# Patient Record
Sex: Female | Born: 1960 | Race: White | Hispanic: No | Marital: Single | Smoking: Never smoker
Health system: Southern US, Community
[De-identification: ages and names within clinical notes are randomized; demographics above are authoritative.]

## PROBLEM LIST (undated history)

## (undated) DIAGNOSIS — M6282 Rhabdomyolysis: Secondary | ICD-10-CM

## (undated) DIAGNOSIS — F329 Major depressive disorder, single episode, unspecified: Secondary | ICD-10-CM

## (undated) DIAGNOSIS — E119 Type 2 diabetes mellitus without complications: Secondary | ICD-10-CM

## (undated) DIAGNOSIS — F319 Bipolar disorder, unspecified: Secondary | ICD-10-CM

## (undated) DIAGNOSIS — E039 Hypothyroidism, unspecified: Secondary | ICD-10-CM

## (undated) DIAGNOSIS — E876 Hypokalemia: Secondary | ICD-10-CM

## (undated) DIAGNOSIS — D696 Thrombocytopenia, unspecified: Secondary | ICD-10-CM

---

## 2019-05-19 ENCOUNTER — Emergency Department: Payer: Commercial Managed Care - PPO

## 2019-05-19 ENCOUNTER — Other Ambulatory Visit: Payer: Self-pay

## 2019-05-19 ENCOUNTER — Inpatient Hospital Stay
Admission: EM | Admit: 2019-05-19 | Discharge: 2019-05-28 | DRG: 085 | Disposition: A | Discharge status: Skilled Nursing Facility | Payer: Commercial Managed Care - PPO | Source: Skilled Nursing Facility | Attending: Internal Medicine | Admitting: Internal Medicine

## 2019-05-19 DIAGNOSIS — G9340 Encephalopathy, unspecified: Secondary | ICD-10-CM | POA: Diagnosis present

## 2019-05-19 DIAGNOSIS — N390 Urinary tract infection, site not specified: Secondary | ICD-10-CM | POA: Diagnosis not present

## 2019-05-19 DIAGNOSIS — S72001K Fracture of unspecified part of neck of right femur, subsequent encounter for closed fracture with nonunion: Secondary | ICD-10-CM

## 2019-05-19 DIAGNOSIS — I10 Essential (primary) hypertension: Secondary | ICD-10-CM | POA: Diagnosis present

## 2019-05-19 DIAGNOSIS — K6812 Psoas muscle abscess: Secondary | ICD-10-CM | POA: Diagnosis present

## 2019-05-19 DIAGNOSIS — L89152 Pressure ulcer of sacral region, stage 2: Secondary | ICD-10-CM | POA: Diagnosis present

## 2019-05-19 DIAGNOSIS — Z23 Encounter for immunization: Secondary | ICD-10-CM | POA: Diagnosis not present

## 2019-05-19 DIAGNOSIS — E440 Moderate protein-calorie malnutrition: Secondary | ICD-10-CM | POA: Diagnosis present

## 2019-05-19 DIAGNOSIS — R4189 Other symptoms and signs involving cognitive functions and awareness: Secondary | ICD-10-CM | POA: Diagnosis not present

## 2019-05-19 DIAGNOSIS — K429 Umbilical hernia without obstruction or gangrene: Secondary | ICD-10-CM | POA: Diagnosis present

## 2019-05-19 DIAGNOSIS — S42253D Displaced fracture of greater tuberosity of unspecified humerus, subsequent encounter for fracture with routine healing: Secondary | ICD-10-CM | POA: Diagnosis not present

## 2019-05-19 DIAGNOSIS — L89150 Pressure ulcer of sacral region, unstageable: Secondary | ICD-10-CM | POA: Diagnosis present

## 2019-05-19 DIAGNOSIS — R339 Retention of urine, unspecified: Secondary | ICD-10-CM | POA: Diagnosis not present

## 2019-05-19 DIAGNOSIS — R410 Disorientation, unspecified: Secondary | ICD-10-CM | POA: Diagnosis not present

## 2019-05-19 DIAGNOSIS — D638 Anemia in other chronic diseases classified elsewhere: Secondary | ICD-10-CM | POA: Diagnosis present

## 2019-05-19 DIAGNOSIS — I63012 Cerebral infarction due to thrombosis of left vertebral artery: Secondary | ICD-10-CM | POA: Diagnosis not present

## 2019-05-19 DIAGNOSIS — M25569 Pain in unspecified knee: Secondary | ICD-10-CM

## 2019-05-19 DIAGNOSIS — E876 Hypokalemia: Secondary | ICD-10-CM | POA: Diagnosis not present

## 2019-05-19 DIAGNOSIS — F319 Bipolar disorder, unspecified: Secondary | ICD-10-CM | POA: Diagnosis present

## 2019-05-19 DIAGNOSIS — Z20822 Contact with and (suspected) exposure to covid-19: Secondary | ICD-10-CM | POA: Diagnosis present

## 2019-05-19 DIAGNOSIS — I959 Hypotension, unspecified: Secondary | ICD-10-CM | POA: Diagnosis present

## 2019-05-19 DIAGNOSIS — M6282 Rhabdomyolysis: Secondary | ICD-10-CM | POA: Diagnosis present

## 2019-05-19 DIAGNOSIS — M4646 Discitis, unspecified, lumbar region: Secondary | ICD-10-CM | POA: Diagnosis present

## 2019-05-19 DIAGNOSIS — Z8673 Personal history of transient ischemic attack (TIA), and cerebral infarction without residual deficits: Secondary | ICD-10-CM | POA: Diagnosis not present

## 2019-05-19 DIAGNOSIS — R059 Cough, unspecified: Secondary | ICD-10-CM

## 2019-05-19 DIAGNOSIS — R4182 Altered mental status, unspecified: Secondary | ICD-10-CM

## 2019-05-19 DIAGNOSIS — E039 Hypothyroidism, unspecified: Secondary | ICD-10-CM | POA: Diagnosis present

## 2019-05-19 DIAGNOSIS — B9620 Unspecified Escherichia coli [E. coli] as the cause of diseases classified elsewhere: Secondary | ICD-10-CM | POA: Diagnosis present

## 2019-05-19 DIAGNOSIS — M069 Rheumatoid arthritis, unspecified: Secondary | ICD-10-CM | POA: Diagnosis present

## 2019-05-19 DIAGNOSIS — Y92129 Unspecified place in nursing home as the place of occurrence of the external cause: Secondary | ICD-10-CM

## 2019-05-19 DIAGNOSIS — L0291 Cutaneous abscess, unspecified: Secondary | ICD-10-CM

## 2019-05-19 DIAGNOSIS — R05 Cough: Secondary | ICD-10-CM

## 2019-05-19 DIAGNOSIS — E871 Hypo-osmolality and hyponatremia: Secondary | ICD-10-CM | POA: Diagnosis not present

## 2019-05-19 DIAGNOSIS — I639 Cerebral infarction, unspecified: Secondary | ICD-10-CM | POA: Diagnosis not present

## 2019-05-19 DIAGNOSIS — E1100 Type 2 diabetes mellitus with hyperosmolarity without nonketotic hyperglycemic-hyperosmolar coma (NKHHC): Secondary | ICD-10-CM | POA: Diagnosis present

## 2019-05-19 DIAGNOSIS — M4626 Osteomyelitis of vertebra, lumbar region: Secondary | ICD-10-CM | POA: Diagnosis present

## 2019-05-19 DIAGNOSIS — E1142 Type 2 diabetes mellitus with diabetic polyneuropathy: Secondary | ICD-10-CM | POA: Diagnosis present

## 2019-05-19 DIAGNOSIS — Z79890 Hormone replacement therapy (postmenopausal): Secondary | ICD-10-CM

## 2019-05-19 DIAGNOSIS — L89322 Pressure ulcer of left buttock, stage 2: Secondary | ICD-10-CM | POA: Diagnosis present

## 2019-05-19 DIAGNOSIS — M810 Age-related osteoporosis without current pathological fracture: Secondary | ICD-10-CM | POA: Diagnosis present

## 2019-05-19 DIAGNOSIS — R5381 Other malaise: Secondary | ICD-10-CM | POA: Diagnosis present

## 2019-05-19 DIAGNOSIS — F05 Delirium due to known physiological condition: Secondary | ICD-10-CM | POA: Diagnosis not present

## 2019-05-19 DIAGNOSIS — S06330A Contusion and laceration of cerebrum, unspecified, without loss of consciousness, initial encounter: Principal | ICD-10-CM | POA: Diagnosis present

## 2019-05-19 DIAGNOSIS — R404 Transient alteration of awareness: Secondary | ICD-10-CM | POA: Diagnosis not present

## 2019-05-19 DIAGNOSIS — S0633AA Contusion and laceration of cerebrum, unspecified, with loss of consciousness status unknown, initial encounter: Secondary | ICD-10-CM | POA: Diagnosis present

## 2019-05-19 DIAGNOSIS — W01198A Fall on same level from slipping, tripping and stumbling with subsequent striking against other object, initial encounter: Secondary | ICD-10-CM | POA: Diagnosis present

## 2019-05-19 DIAGNOSIS — L8990 Pressure ulcer of unspecified site, unspecified stage: Secondary | ICD-10-CM | POA: Diagnosis present

## 2019-05-19 DIAGNOSIS — S06339A Contusion and laceration of cerebrum, unspecified, with loss of consciousness of unspecified duration, initial encounter: Secondary | ICD-10-CM | POA: Diagnosis present

## 2019-05-19 DIAGNOSIS — Z79899 Other long term (current) drug therapy: Secondary | ICD-10-CM

## 2019-05-19 DIAGNOSIS — E1165 Type 2 diabetes mellitus with hyperglycemia: Secondary | ICD-10-CM | POA: Diagnosis not present

## 2019-05-19 DIAGNOSIS — R262 Difficulty in walking, not elsewhere classified: Secondary | ICD-10-CM

## 2019-05-19 DIAGNOSIS — R296 Repeated falls: Secondary | ICD-10-CM | POA: Diagnosis not present

## 2019-05-19 DIAGNOSIS — M1710 Unilateral primary osteoarthritis, unspecified knee: Secondary | ICD-10-CM | POA: Diagnosis present

## 2019-05-19 DIAGNOSIS — M899 Disorder of bone, unspecified: Secondary | ICD-10-CM | POA: Diagnosis not present

## 2019-05-19 DIAGNOSIS — F3113 Bipolar disorder, current episode manic without psychotic features, severe: Secondary | ICD-10-CM | POA: Diagnosis not present

## 2019-05-19 DIAGNOSIS — S06310D Contusion and laceration of right cerebrum without loss of consciousness, subsequent encounter: Secondary | ICD-10-CM | POA: Diagnosis not present

## 2019-05-19 DIAGNOSIS — E118 Type 2 diabetes mellitus with unspecified complications: Secondary | ICD-10-CM

## 2019-05-19 HISTORY — DX: Bipolar disorder, unspecified: F31.9

## 2019-05-19 HISTORY — DX: Type 2 diabetes mellitus without complications: E11.9

## 2019-05-19 HISTORY — DX: Thrombocytopenia, unspecified: D69.6

## 2019-05-19 HISTORY — DX: Hypothyroidism, unspecified: E03.9

## 2019-05-19 HISTORY — DX: Major depressive disorder, single episode, unspecified: F32.9

## 2019-05-19 HISTORY — DX: Rhabdomyolysis: M62.82

## 2019-05-19 HISTORY — DX: Hypokalemia: E87.6

## 2019-05-19 LAB — BASIC METABOLIC PANEL
Anion gap: 13 (ref 5–15)
BUN: 29 mg/dL — ABNORMAL HIGH (ref 6–20)
CO2: 29 mmol/L (ref 22–32)
Calcium: 8.5 mg/dL — ABNORMAL LOW (ref 8.9–10.3)
Chloride: 93 mmol/L — ABNORMAL LOW (ref 98–111)
Creatinine, Ser: 0.4 mg/dL — ABNORMAL LOW (ref 0.44–1.00)
GFR calc Af Amer: >60 mL/min (ref >60)
GFR calc non Af Amer: >60 mL/min (ref >60)
Glucose, Bld: 184 mg/dL — ABNORMAL HIGH (ref 70–99)
Potassium: 2.9 mmol/L — ABNORMAL LOW (ref 3.5–5.1)
Sodium: 135 mmol/L (ref 135–145)

## 2019-05-19 LAB — COMPREHENSIVE METABOLIC PANEL
ALT: 10 U/L (ref 0–44)
AST: 15 U/L (ref 15–41)
Albumin: 3 g/dL — ABNORMAL LOW (ref 3.5–5.0)
Alkaline Phosphatase: 161 U/L — ABNORMAL HIGH (ref 38–126)
Anion gap: 14 (ref 5–15)
BUN: 36 mg/dL — ABNORMAL HIGH (ref 6–20)
CO2: 29 mmol/L (ref 22–32)
Calcium: 8.8 mg/dL — ABNORMAL LOW (ref 8.9–10.3)
Chloride: 91 mmol/L — ABNORMAL LOW (ref 98–111)
Creatinine, Ser: 0.45 mg/dL (ref 0.44–1.00)
GFR calc Af Amer: >60 mL/min (ref >60)
GFR calc non Af Amer: >60 mL/min (ref >60)
Glucose, Bld: 242 mg/dL — ABNORMAL HIGH (ref 70–99)
Potassium: 2.6 mmol/L — CL (ref 3.5–5.1)
Sodium: 134 mmol/L — ABNORMAL LOW (ref 135–145)
Total Bilirubin: 1.1 mg/dL (ref 0.3–1.2)
Total Protein: 8.2 g/dL — ABNORMAL HIGH (ref 6.5–8.1)

## 2019-05-19 LAB — PROTIME-INR
INR: 1.1 (ref 0.8–1.2)
Prothrombin Time: 14.2 seconds (ref 11.4–15.2)

## 2019-05-19 LAB — TYPE AND SCREEN
ABO/RH(D): O POS
Antibody Screen: NEGATIVE

## 2019-05-19 LAB — MAGNESIUM: Magnesium: 1.7 mg/dL (ref 1.7–2.4)

## 2019-05-19 LAB — TROPONIN I (HIGH SENSITIVITY)
Troponin I (High Sensitivity): 5 ng/L (ref <18)
Troponin I (High Sensitivity): 5 ng/L (ref <18)

## 2019-05-19 LAB — APTT: aPTT: 28 seconds (ref 24–36)

## 2019-05-19 LAB — CBC WITH DIFFERENTIAL/PLATELET
Abs Immature Granulocytes: 0.06 10*3/uL (ref 0.00–0.07)
Basophils Absolute: 0 10*3/uL (ref 0.0–0.1)
Basophils Relative: 0 %
Eosinophils Absolute: 0 10*3/uL (ref 0.0–0.5)
Eosinophils Relative: 0 %
HCT: 32.3 % — ABNORMAL LOW (ref 36.0–46.0)
Hemoglobin: 11 g/dL — ABNORMAL LOW (ref 12.0–15.0)
Immature Granulocytes: 1 %
Lymphocytes Relative: 6 %
Lymphs Abs: 0.7 10*3/uL (ref 0.7–4.0)
MCH: 30.2 pg (ref 26.0–34.0)
MCHC: 34.1 g/dL (ref 30.0–36.0)
MCV: 88.7 fL (ref 80.0–100.0)
Monocytes Absolute: 0.4 10*3/uL (ref 0.1–1.0)
Monocytes Relative: 3 %
Neutro Abs: 11.5 10*3/uL — ABNORMAL HIGH (ref 1.7–7.7)
Neutrophils Relative %: 90 %
Platelets: 349 10*3/uL (ref 150–400)
RBC: 3.64 MIL/uL — ABNORMAL LOW (ref 3.87–5.11)
RDW: 14.4 % (ref 11.5–15.5)
WBC: 12.7 10*3/uL — ABNORMAL HIGH (ref 4.0–10.5)
nRBC: 0 % (ref 0.0–0.2)

## 2019-05-19 LAB — SARS CORONAVIRUS 2 BY RT PCR (HOSPITAL ORDER, PERFORMED IN ~~LOC~~ HOSPITAL LAB): SARS Coronavirus 2: NEGATIVE

## 2019-05-19 IMAGING — MR MR THORACIC SPINE W/O CM
17 of 20 series · 29 of 48 positions shown · non-contrast
Comparison: None.

CLINICAL DATA: Mental status changes.  Fall.

EXAM:
MRI THORACIC SPINE WITHOUT CONTRAST
TECHNIQUE: Multiplanar, multisequence MR imaging of the thoracic spine was
performed. No intravenous contrast was administered.

[Series 39: FLAIR · sagittal · 3.0mm · 0.78mm/px · 1 of 15 slices shown]
[im 1/15]
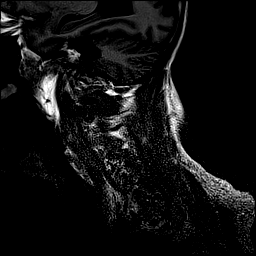

[Series 40: STIR · sagittal · 3.0mm · 0.62mm/px · 1 of 15 slices shown (1 of 3)]
[im 1/15]
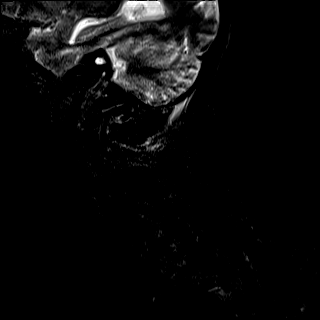

[Series 41: STIR · sagittal · 3.0mm · 0.62mm/px · 1 of 15 slices shown (2 of 3)]
[im 1/15]
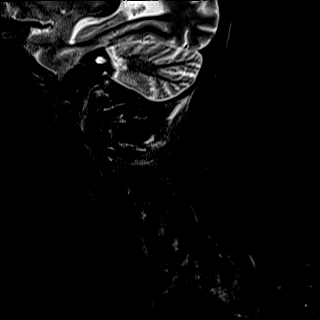

[Series 42: T2 · axial · 3.0mm · 0.70mm/px · 1 of 24 slices shown (1 of 3)]
[im 1/24]
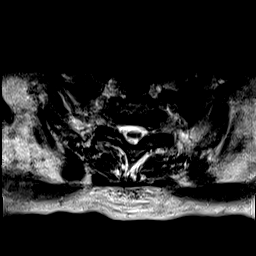

[Series 43: ax mpgr · axial · 3.0mm · 0.35mm/px · 1 of 27 slices shown]
[im 1/27]
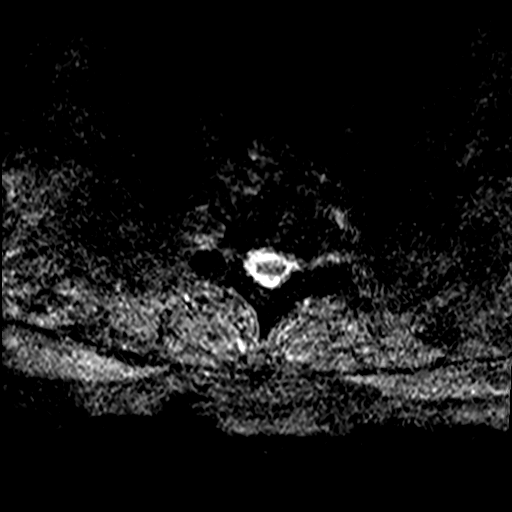

[Series 44: T1 · axial · non-contrast · 3.0mm · 0.35mm/px · z∈[-118,-38]mm · 2 of 27 slices shown (1 of 3)]
[im 1/27]
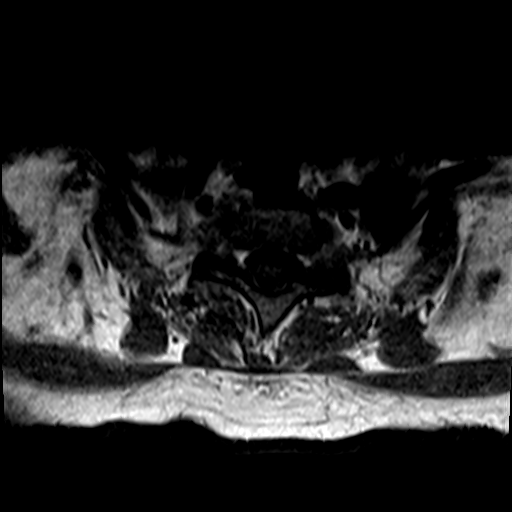
[im 27/27]
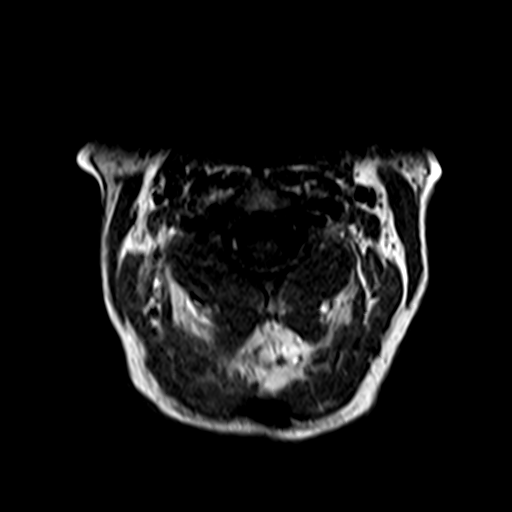

[Series 45: T2 · sagittal · 3.0mm · 0.94mm/px · 1 of 17 slices shown (2 of 3)]
[im 1/17]
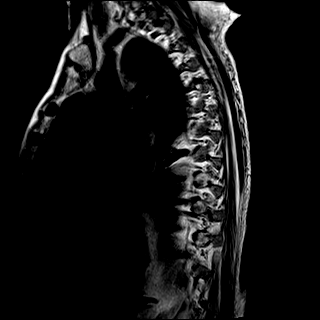

[Series 46: T1 · sagittal · 3.0mm · 0.94mm/px · 1 of 17 slices shown (2 of 3)]
[im 1/17]
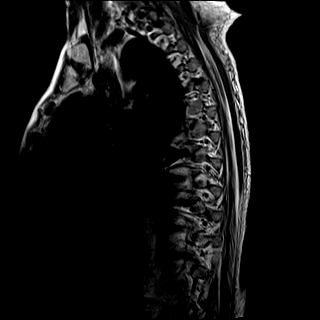

[Series 47: STIR · sagittal · 3.0mm · 0.47mm/px · 1 of 17 slices shown (3 of 3)]
[im 1/17]
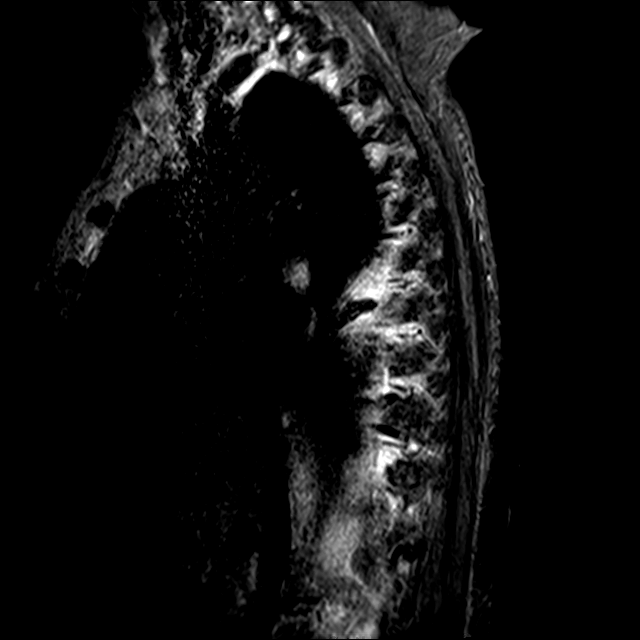

[Series 48: T2 · axial · 4.0mm · 0.59mm/px · z∈[-315,-123]mm · 2 of 39 slices shown (3 of 3)]
[im 1/39]
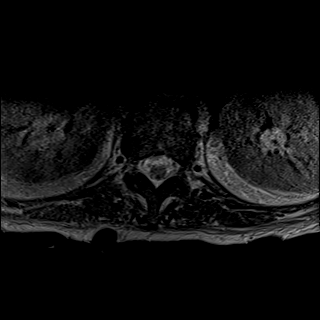
[im 39/39]
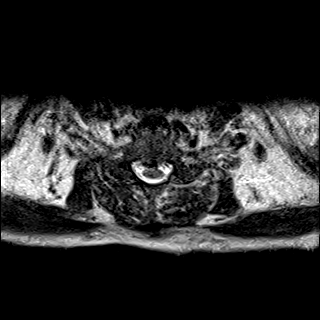

[Series 49: GRE · axial · 4.0mm · 0.37mm/px · z∈[-315,-123]mm · 2 of 39 slices shown]
[im 1/39]
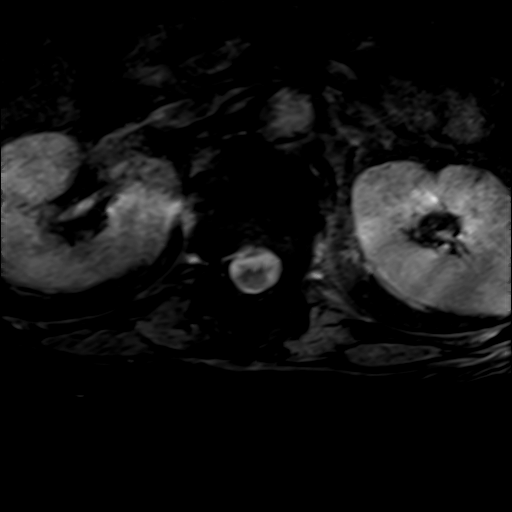
[im 39/39]
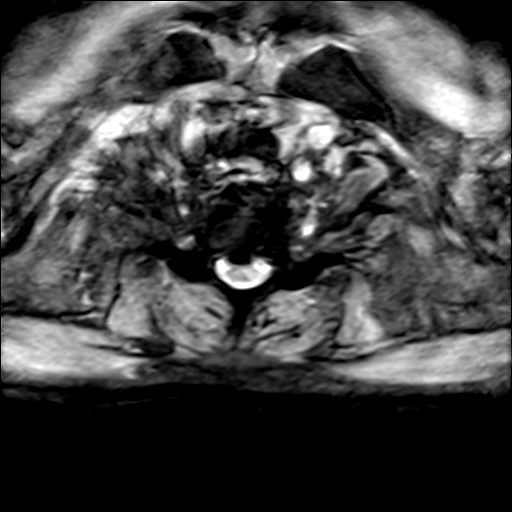

[Series 50: T1 post-contrast · axial · non-contrast · 4.0mm · 0.37mm/px · z∈[-315,-123]mm · 2 of 39 slices shown (1 of 2)]
[im 1/39]
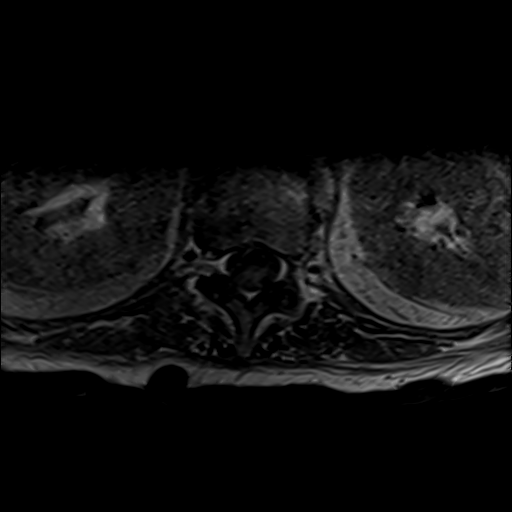
[im 39/39]
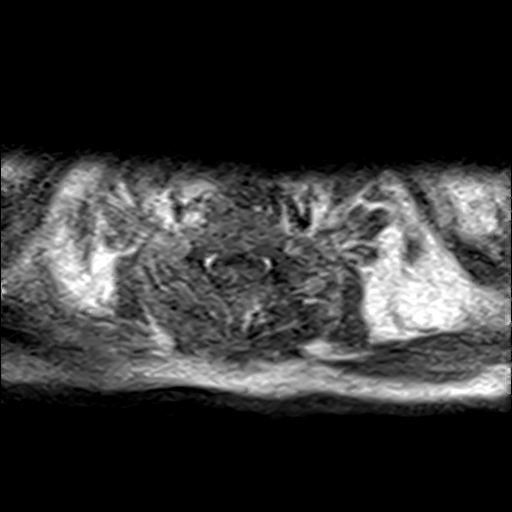

[Series 59: angio_fl3d_cor_pre_ttc=2.0s · coronal · 0.9mm · 0.85mm/px · 6 of 96 slices shown]
[im 1/96]
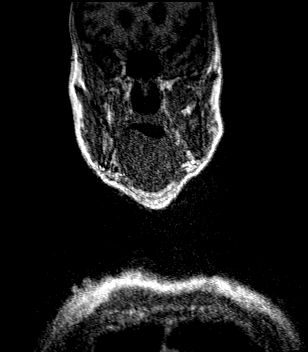
[im 20/96]
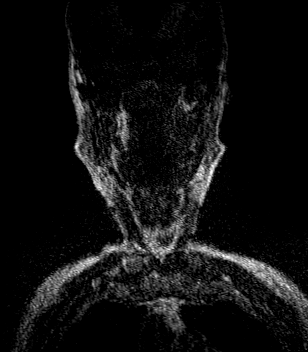
[im 39/96]
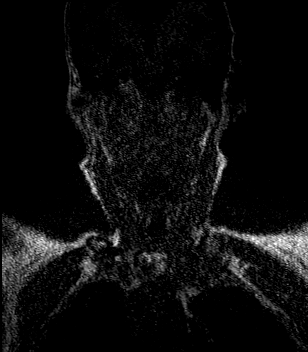
[im 58/96]
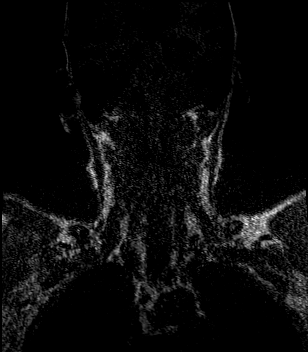
[im 77/96]
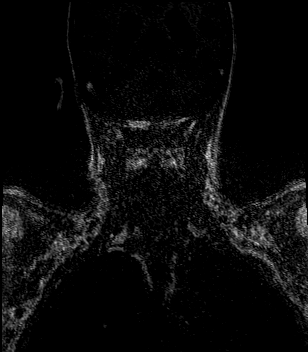
[im 96/96]
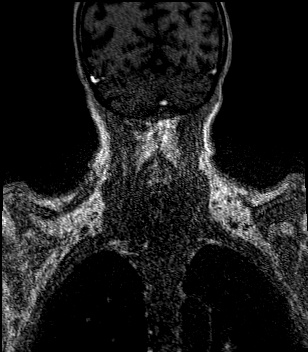

[Series 61: angio_fl3d_cor_post_ttc=2.0s · coronal · 0.9mm · 0.85mm/px · 2 of 96 slices shown]
[im 1/96]
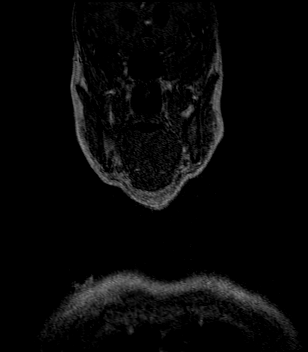
[im 20/96]
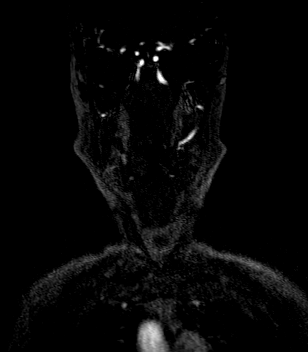

[Series 66: T1 post-contrast · axial · 3.0mm · 0.35mm/px · z∈[-118,-38]mm · 2 of 27 slices shown (2 of 2)]
[im 1/27]
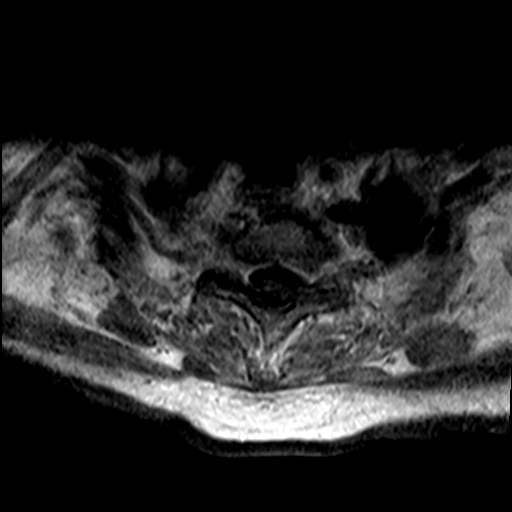
[im 27/27]
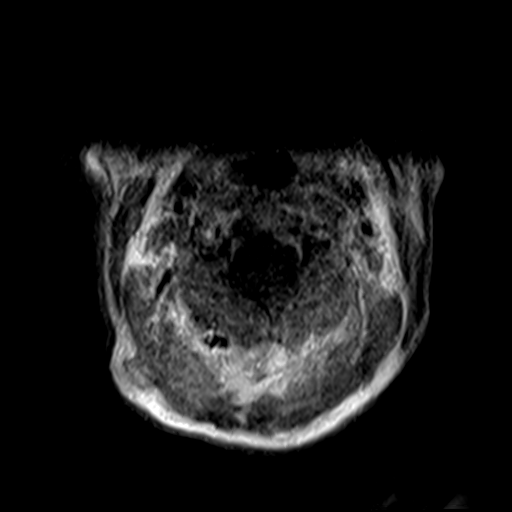

[Series 67: T1 fat-sat post-contrast · sagittal · 4.0mm · 0.81mm/px · 1 of 19 slices shown]
[im 1/19]
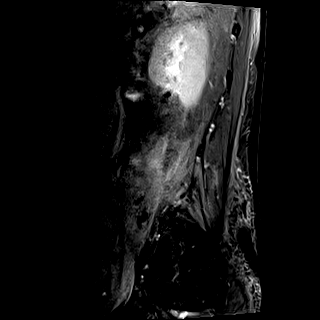

[Series 68: T1 · axial · 4.0mm · 0.39mm/px · z∈[-479,-299]mm · 2 of 33 slices shown (3 of 3)]
[im 1/33]
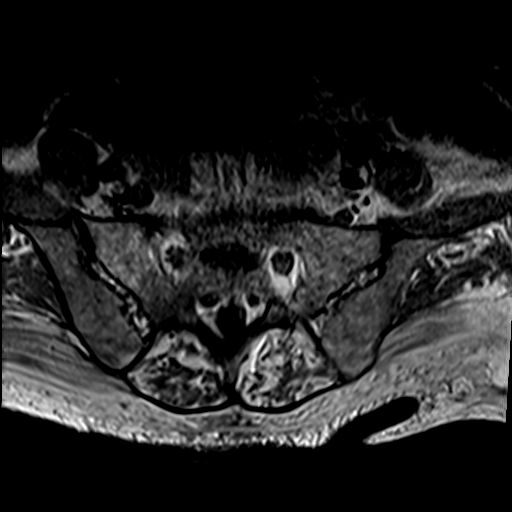
[im 33/33]
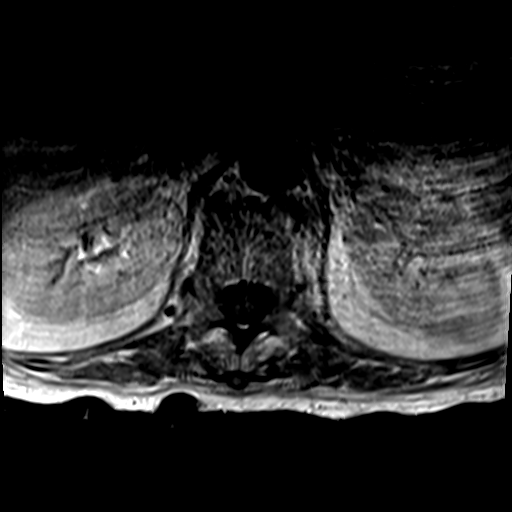

[29 of 48 positions shown; findings below may reference images not displayed]

FINDINGS: Alignment:  No traumatic malalignment.

Vertebrae: No thoracic region fracture.

Cord:  No cord compression or primary cord lesion.

Paraspinal and other soft tissues: Negative

Disc levels:

Detail is limited because of motion. Degenerative spondylosis and
facet arthritis in the thoracic region as often seen at this age. No
evidence of compressive canal stenosis. Foraminal stenosis on the
right at T4-5 due to osteophytic encroachment could possibly affect
the right C5 nerve. The other foramina appear sufficiently patent.
IMPRESSION: Motion degraded study. No evidence of acute thoracic region fracture
or cord injury.

Degenerative spondylosis and facet arthropathy, with limited
evaluation due to motion. No apparent compressive canal stenosis.
Right foraminal narrowing at T4-5 because of osteophytic
encroachment.

## 2019-05-19 IMAGING — MR MR LUMBAR SPINE WO/W CM
4 of 5 series · 33 of 48 positions shown · IV contrast (gadavist)
Comparison: CT [DATE]

CLINICAL DATA: Fell.  Abnormal CT of the lumbar region.

EXAM:
MRI LUMBAR SPINE WITHOUT AND WITH CONTRAST
TECHNIQUE: Multiplanar and multiecho pulse sequences of the lumbar spine were
obtained without and with intravenous contrast.
CONTRAST:  5mL GADAVIST GADOBUTROL 1 MMOL/ML IV SOLN

[Series 32: T2 · sagittal · 4.0mm · 0.81mm/px · 8 of 19 slices shown (1 of 2)]
[im 1/19]
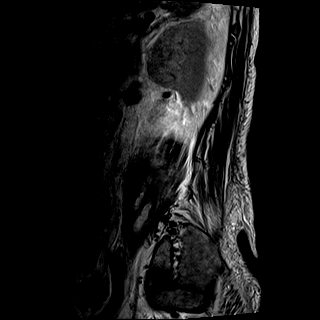
[im 3/19]
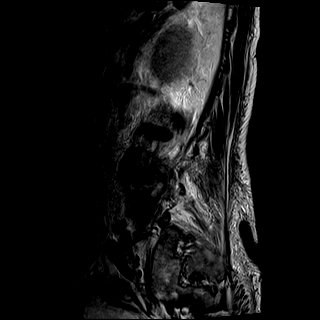
[im 6/19]
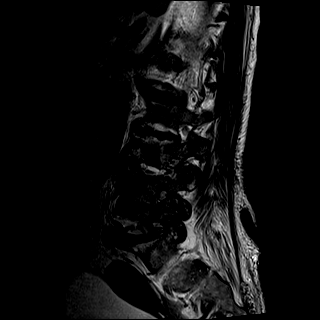
[im 8/19]
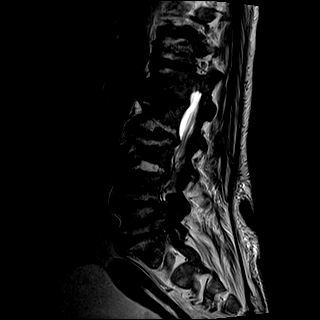
[im 11/19]
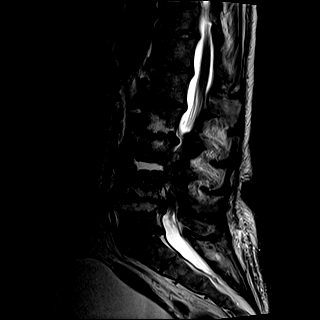
[im 13/19]
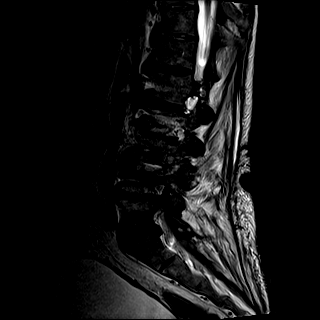
[im 16/19]
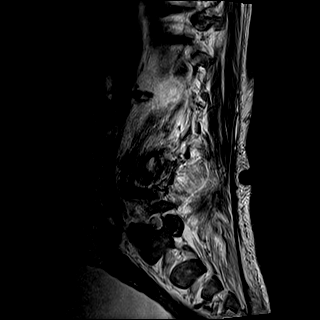
[im 19/19]
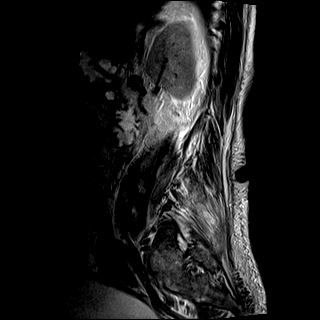

[Series 33: T1 · sagittal · 4.0mm · 0.81mm/px · 7 of 19 slices shown (1 of 2)]
[im 1/19]
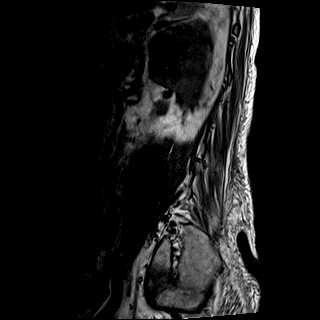
[im 4/19]
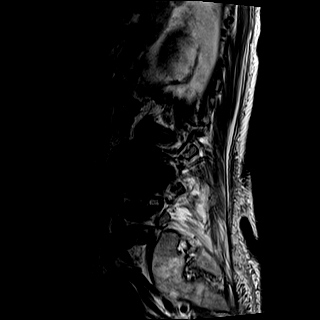
[im 7/19]
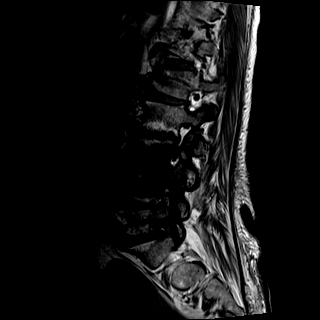
[im 10/19]
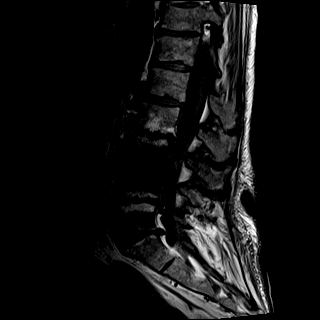
[im 13/19]
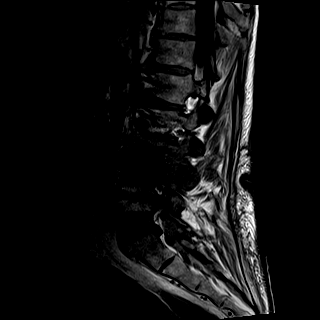
[im 16/19]
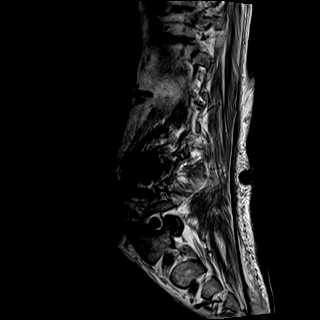
[im 19/19]
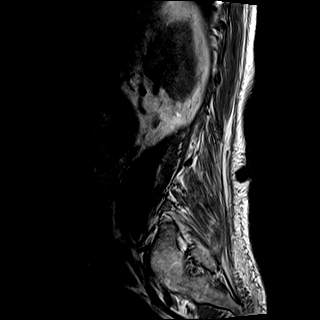

[Series 35: T2 · axial · 4.0mm · 0.78mm/px · z∈[-479,-299]mm · 9 of 33 slices shown (2 of 2)]
[im 1/33]
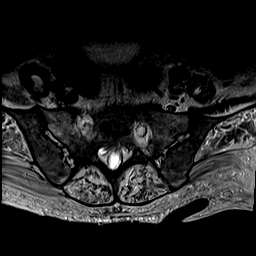
[im 6/33]
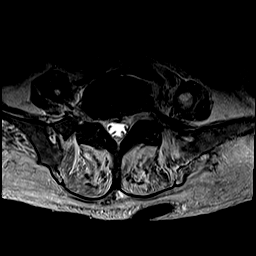
[im 11/33]
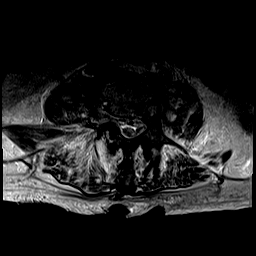
[im 14/33]
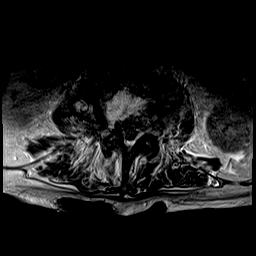
[im 17/33]
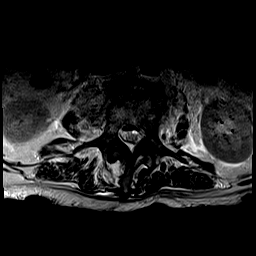
[im 19/33]
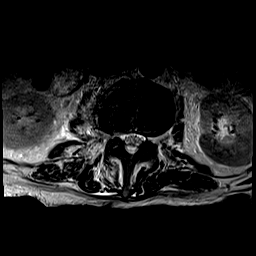
[im 22/33]
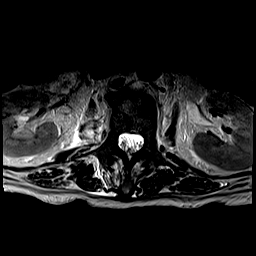
[im 27/33]
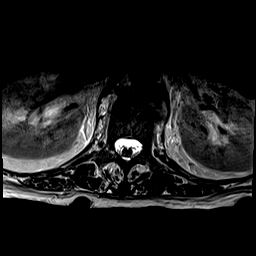
[im 33/33]
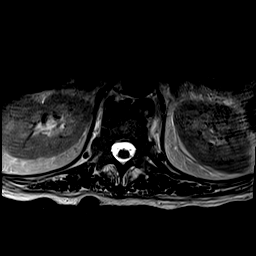

[Series 36: T1 · axial · 4.0mm · 0.39mm/px · z∈[-479,-299]mm · 9 of 33 slices shown (2 of 2)]
[im 1/33]
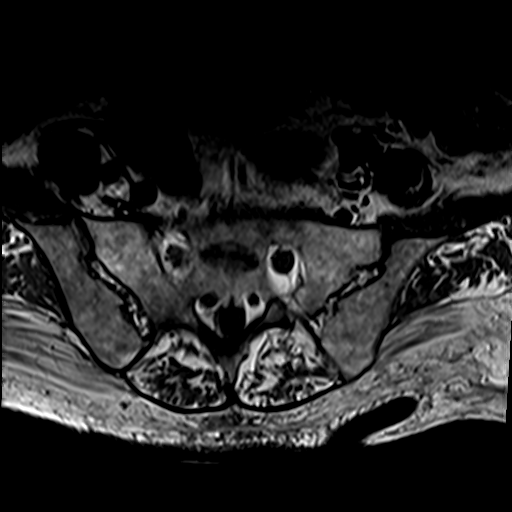
[im 6/33]
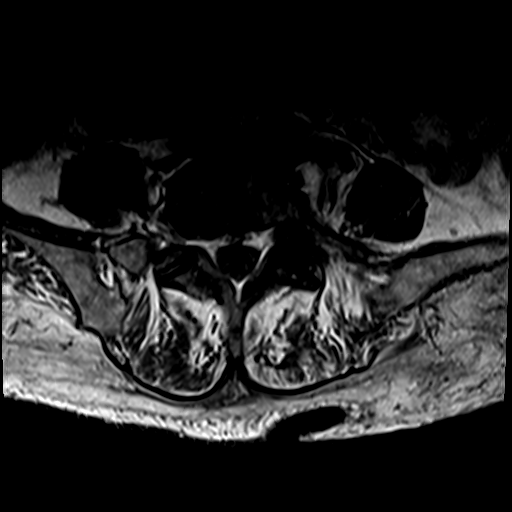
[im 11/33]
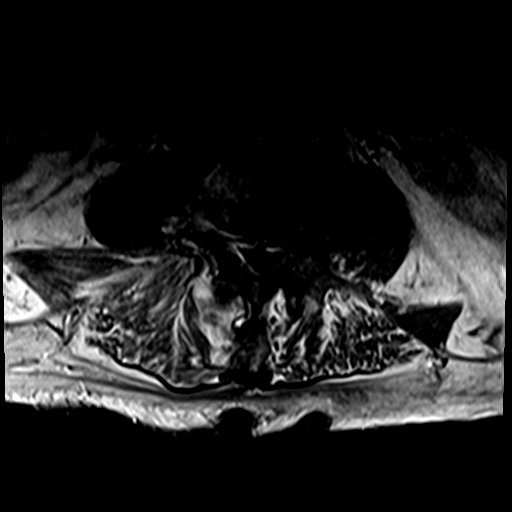
[im 14/33]
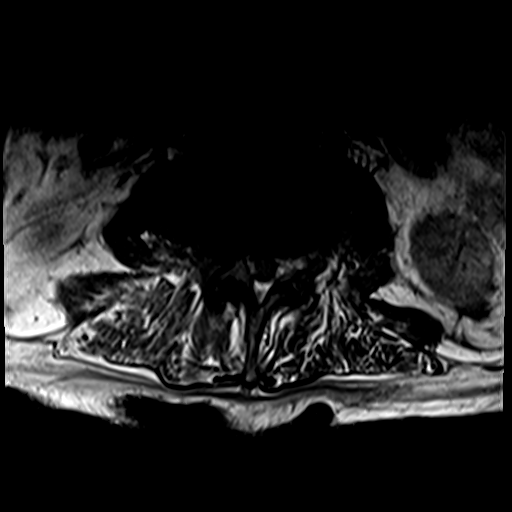
[im 17/33]
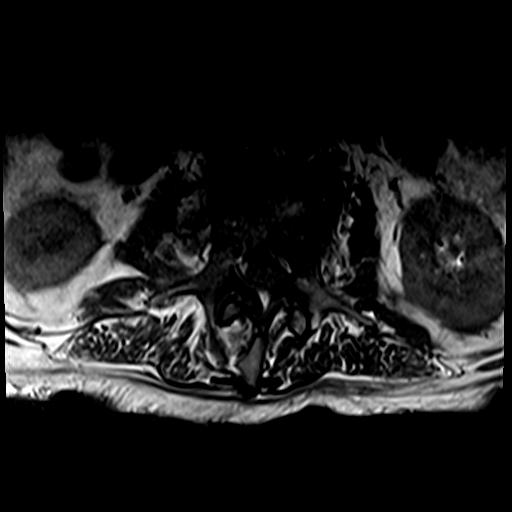
[im 19/33]
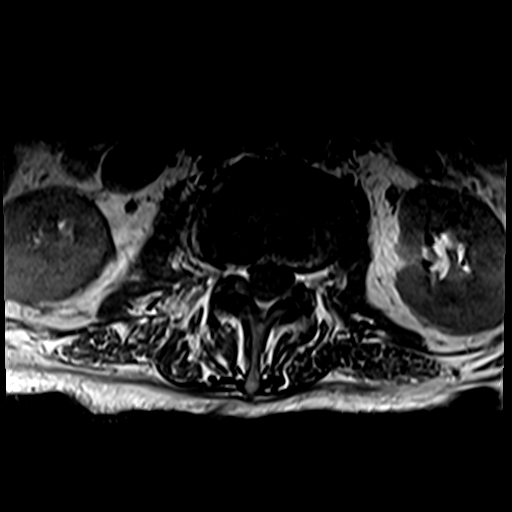
[im 22/33]
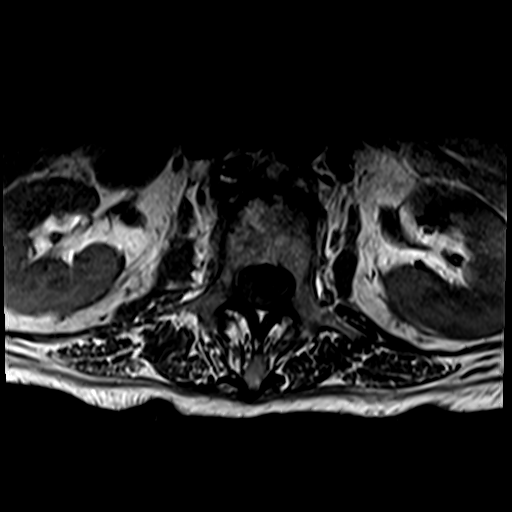
[im 27/33]
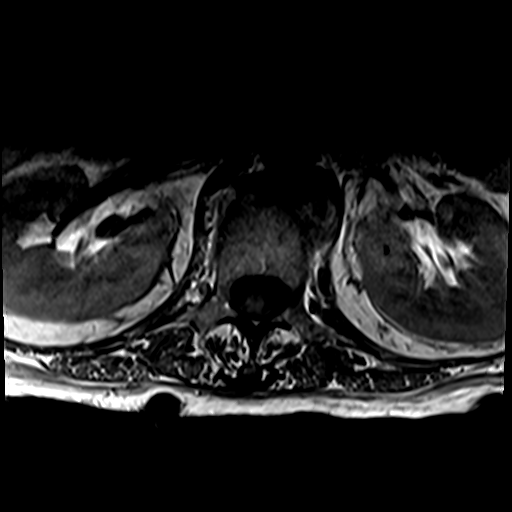
[im 33/33]
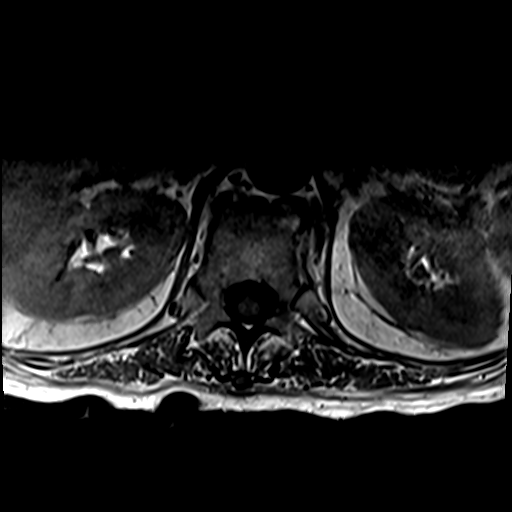

[33 of 48 positions shown; findings below may reference images not displayed]

FINDINGS: Segmentation:  5 lumbar type vertebral bodies.

Alignment:  Curvature convex to the left the apex at L2-3.

Vertebrae: Chronic discogenic endplate changes at L2-3, L4-5 and
L5-S1. At L3-4, the disc space is filled with fluid intensity
material. There is edema and enhancement of the L3 and L4 vertebral
bodies. There is regional epidural edema and enhancement. These
findings are worrisome for infectious discitis osteomyelitis. There
are fluid collections within the psoas muscles bilaterally, also
most consistent with infection.

Conus medullaris and cauda equina: Conus extends to the L1-2 level.
Conus and cauda equina appear normal.

Paraspinal and other soft tissues: Bilateral psoas muscle fluid
collections most consistent with abscesses. Psoas hematomas could
have a similar appearance in a different setting.

Disc levels:

L1-2: Disc bulge.  No compressive stenosis.

L2-3: Endplate osteophytes and bulging of the disc. Mild facet
hypertrophy. Moderate multifactorial stenosis.

L3-4: As noted above, fluid intensity material filling the disc
space. Edema of the L3 and L4 vertebral bodies. Epidural edema and
enhancement. Bilateral psoas collections most consistent with psoas
abscesses. The picture is most consistent with infectious discitis
osteomyelitis.

L4-5: Spondylosis with endplate osteophytes and chronic disc
protrusion. Moderate multifactorial stenosis that could cause neural
compression.

L5-S1: Endplate osteophytes and bulging of the disc. No central
canal stenosis. Moderate left foraminal stenosis.
IMPRESSION: Findings consistent with infectious discitis at the L3-4 level with
L3 and L4 osteomyelitis and bilateral psoas abscesses. See above for
full discussion.

## 2019-05-19 IMAGING — MR MR LUMBAR SPINE WO/W CM
2 series · 29 of 48 positions shown · IV contrast (5ml Gadavist)
Comparison: CT [DATE]

CLINICAL DATA: Fell.  Abnormal CT of the lumbar region.

EXAM:
MRI LUMBAR SPINE WITHOUT AND WITH CONTRAST
TECHNIQUE: Multiplanar and multiecho pulse sequences of the lumbar spine were
obtained without and with intravenous contrast.
CONTRAST:  5mL GADAVIST GADOBUTROL 1 MMOL/ML IV SOLN

[Series 67: T1 fat-sat post-contrast · sagittal · 4.0mm · 0.81mm/px · 18 of 19 slices shown]
[im 1/19]
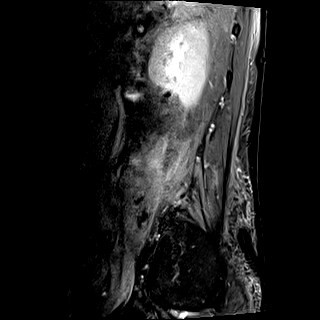
[im 2/19]
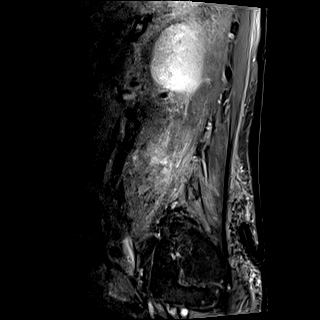
[im 3/19]
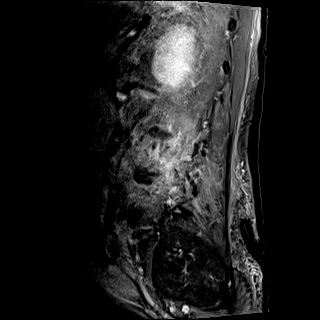
[im 4/19]
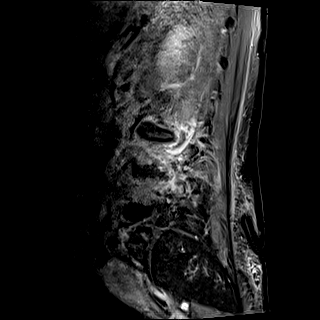
[im 5/19]
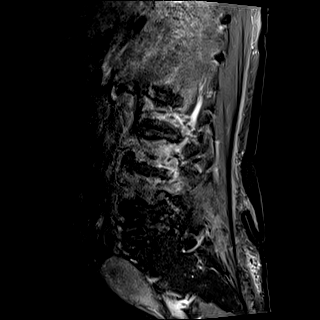
[im 6/19]
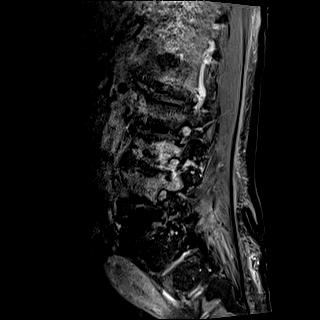
[im 7/19]
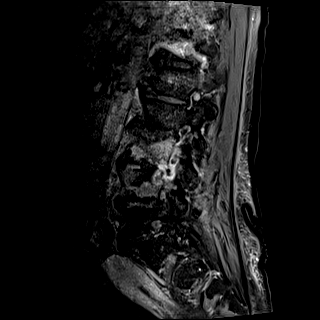
[im 8/19]
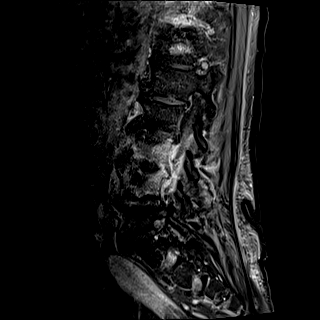
[im 9/19]
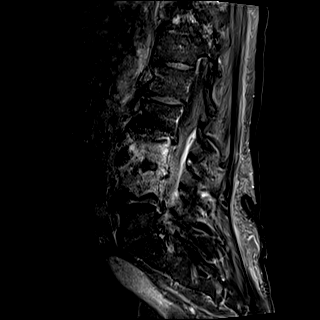
[im 10/19]
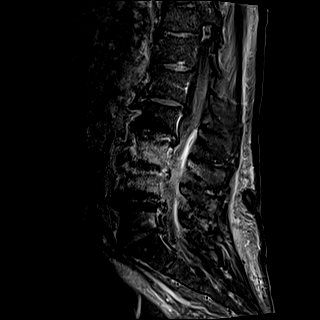
[im 11/19]
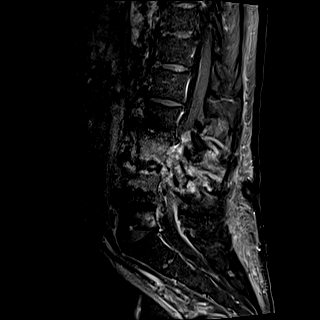
[im 12/19]
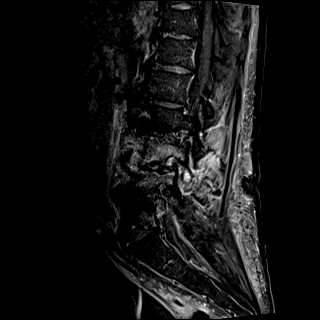
[im 13/19]
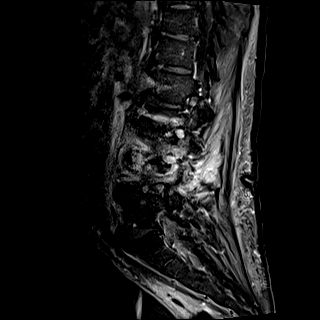
[im 14/19]
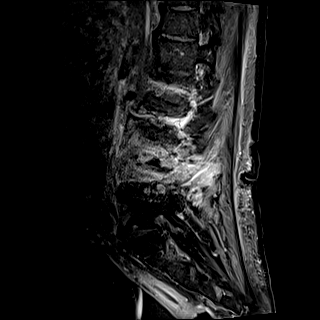
[im 15/19]
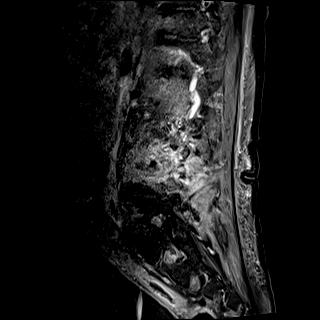
[im 16/19]
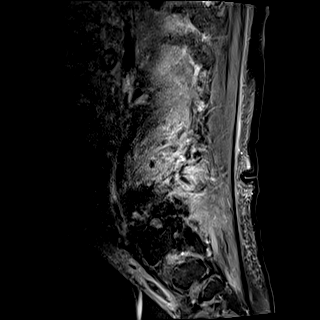
[im 17/19]
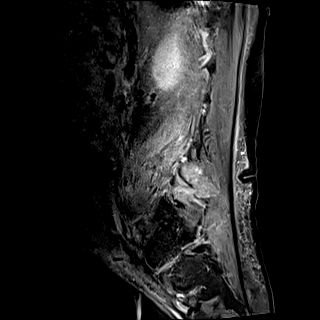
[im 19/19]
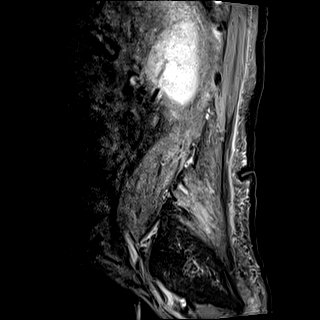

[Series 68: T1 · axial · 4.0mm · 0.39mm/px · z∈[-479,-309]mm · 11 of 33 slices shown]
[im 1/33]
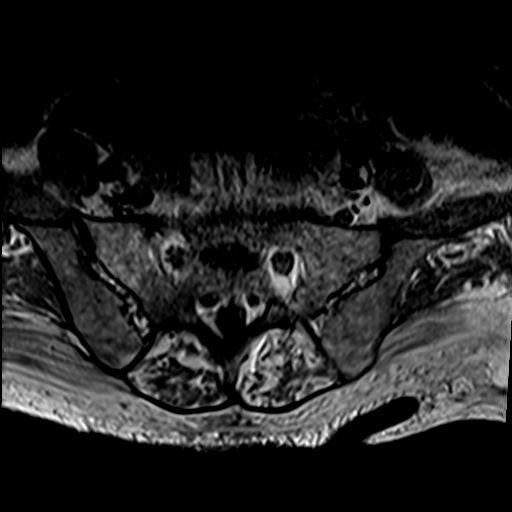
[im 2/33]
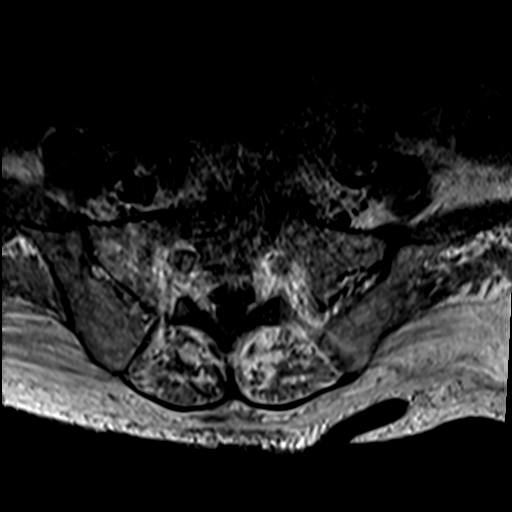
[im 6/33]
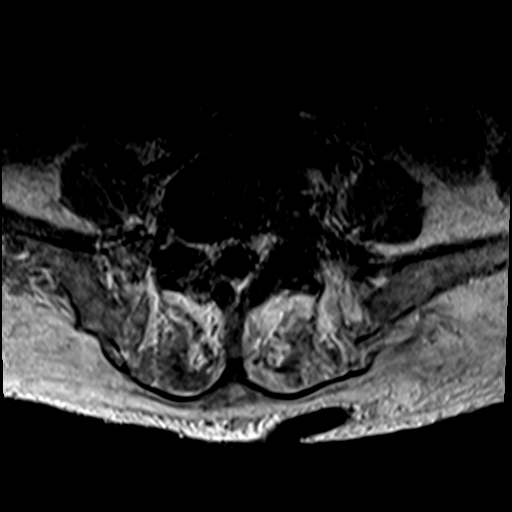
[im 10/33]
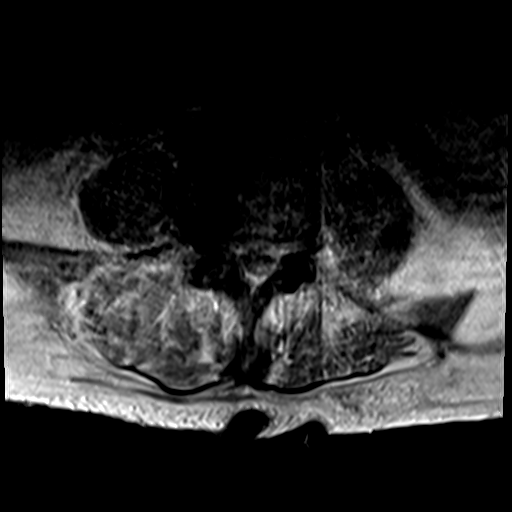
[im 15/33]
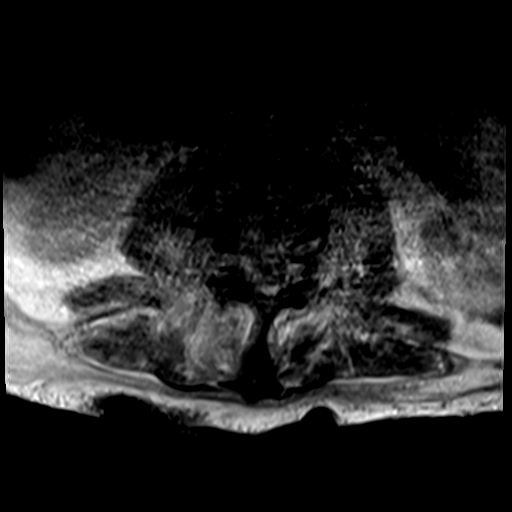
[im 17/33]
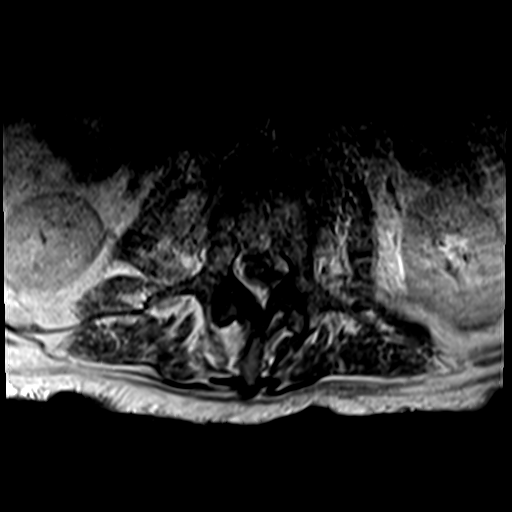
[im 18/33]
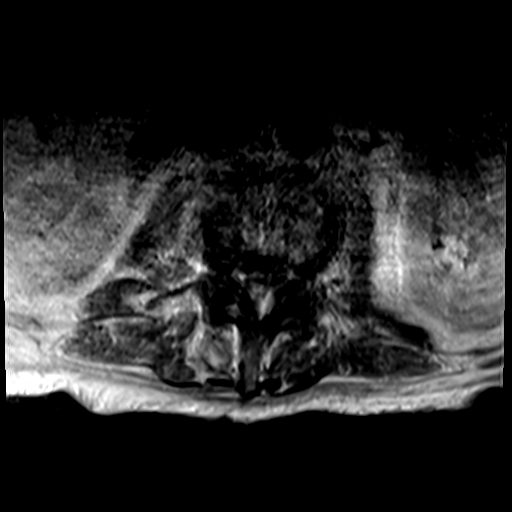
[im 23/33]
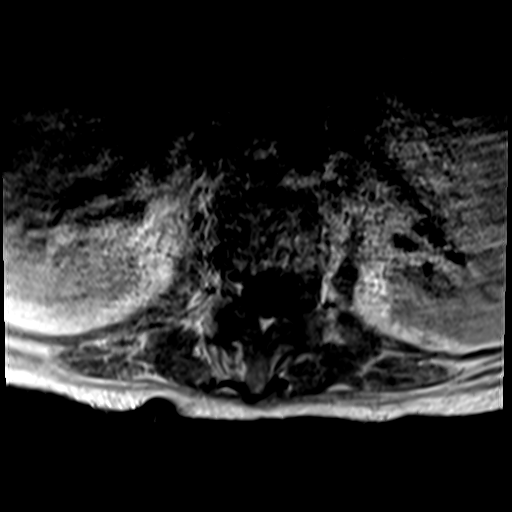
[im 27/33]
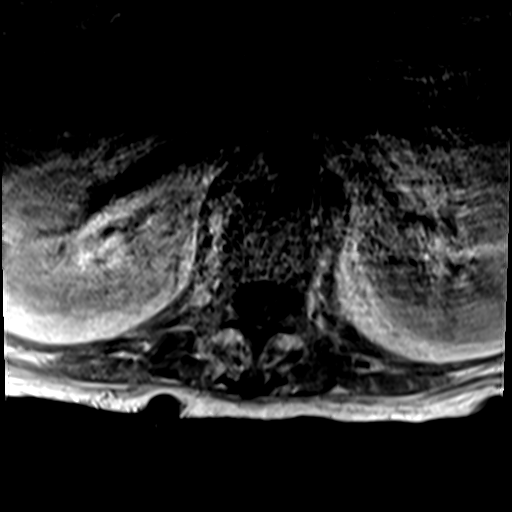
[im 28/33]
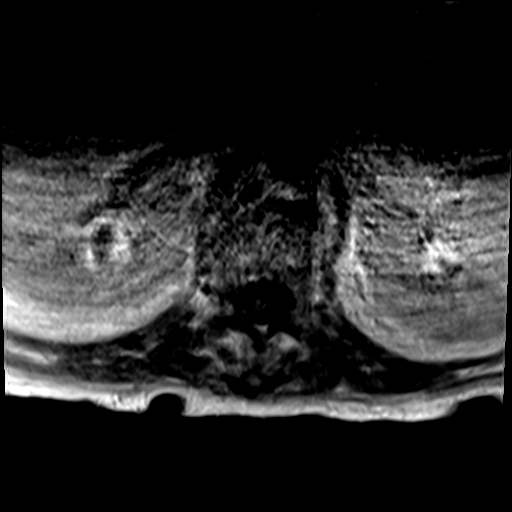
[im 31/33]
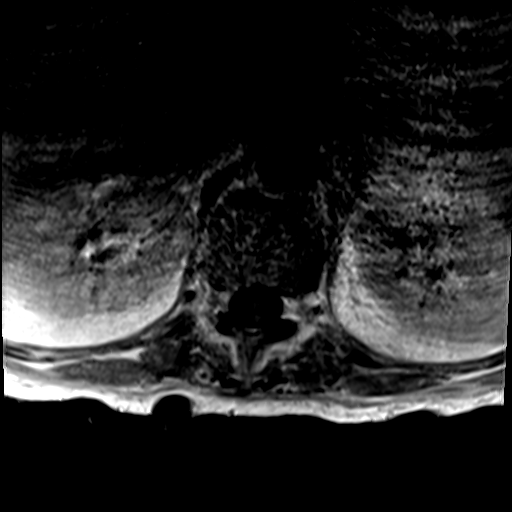

[29 of 48 positions shown; findings below may reference images not displayed]

FINDINGS: Segmentation:  5 lumbar type vertebral bodies.

Alignment:  Curvature convex to the left the apex at L2-3.

Vertebrae: Chronic discogenic endplate changes at L2-3, L4-5 and
L5-S1. At L3-4, the disc space is filled with fluid intensity
material. There is edema and enhancement of the L3 and L4 vertebral
bodies. There is regional epidural edema and enhancement. These
findings are worrisome for infectious discitis osteomyelitis. There
are fluid collections within the psoas muscles bilaterally, also
most consistent with infection.

Conus medullaris and cauda equina: Conus extends to the L1-2 level.
Conus and cauda equina appear normal.

Paraspinal and other soft tissues: Bilateral psoas muscle fluid
collections most consistent with abscesses. Psoas hematomas could
have a similar appearance in a different setting.

Disc levels:

L1-2: Disc bulge.  No compressive stenosis.

L2-3: Endplate osteophytes and bulging of the disc. Mild facet
hypertrophy. Moderate multifactorial stenosis.

L3-4: As noted above, fluid intensity material filling the disc
space. Edema of the L3 and L4 vertebral bodies. Epidural edema and
enhancement. Bilateral psoas collections most consistent with psoas
abscesses. The picture is most consistent with infectious discitis
osteomyelitis.

L4-5: Spondylosis with endplate osteophytes and chronic disc
protrusion. Moderate multifactorial stenosis that could cause neural
compression.

L5-S1: Endplate osteophytes and bulging of the disc. No central
canal stenosis. Moderate left foraminal stenosis.
IMPRESSION: Findings consistent with infectious discitis at the L3-4 level with
L3 and L4 osteomyelitis and bilateral psoas abscesses. See above for
full discussion.

## 2019-05-19 IMAGING — CT CT ABD-PELV W/O CM
2 of 4 series · 16 of 46 positions shown, 18 images · non-contrast
Comparison: [DATE].

CLINICAL DATA: Status post fall.

EXAM:
CT ABDOMEN AND PELVIS WITHOUT CONTRAST
TECHNIQUE: Multidetector CT imaging of the abdomen and pelvis was performed
following the standard protocol without IV contrast.

[Series 2: axial st · axial · 0.84mm/px · z∈[-526,-141]mm · 13 of 85 slices shown, 15 images]
[im 4/85  soft-tissue]
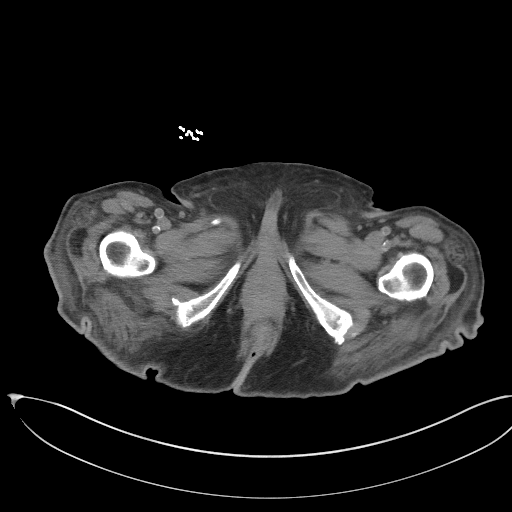
[im 4/85  bone]
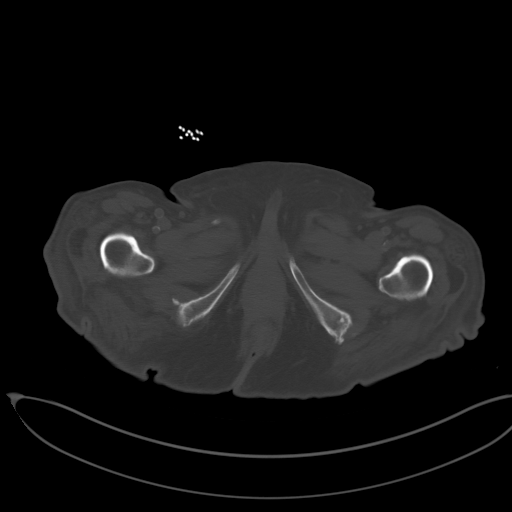
[im 11/85  soft-tissue]
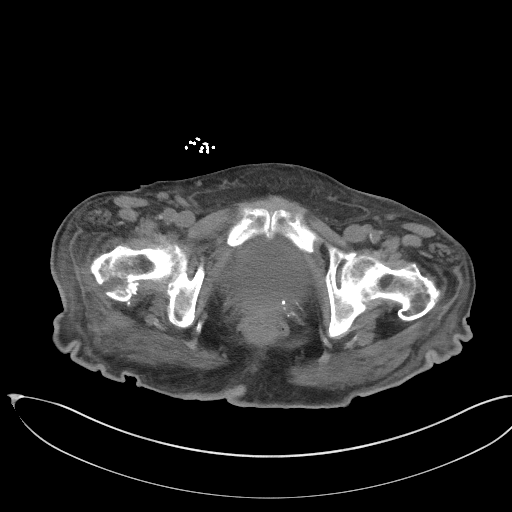
[im 18/85  soft-tissue]
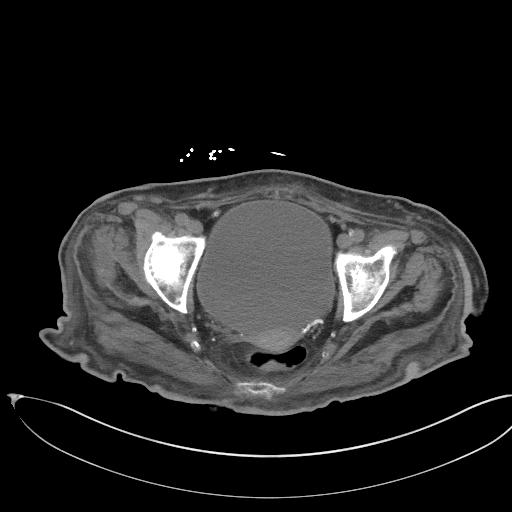
[im 25/85  soft-tissue]
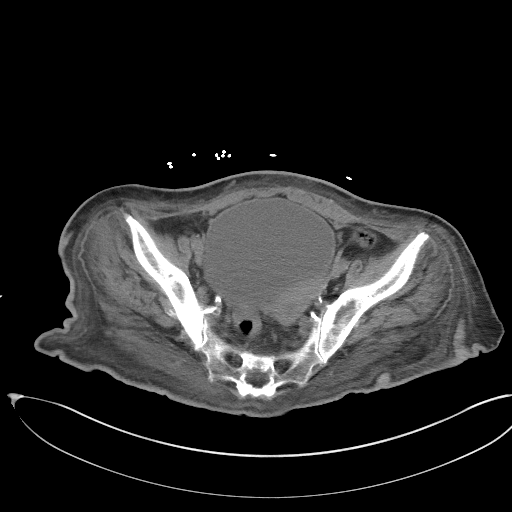
[im 29/85  soft-tissue]
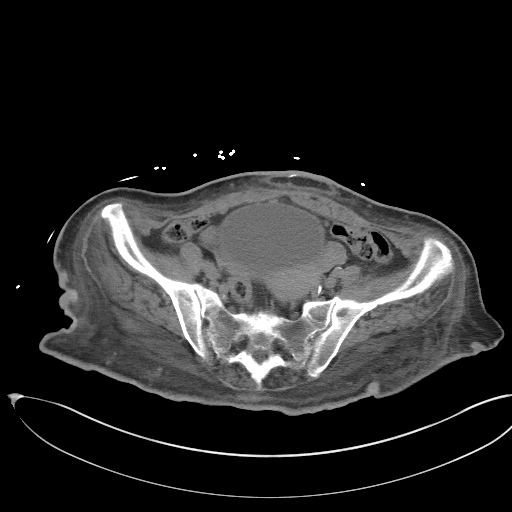
[im 36/85  soft-tissue]
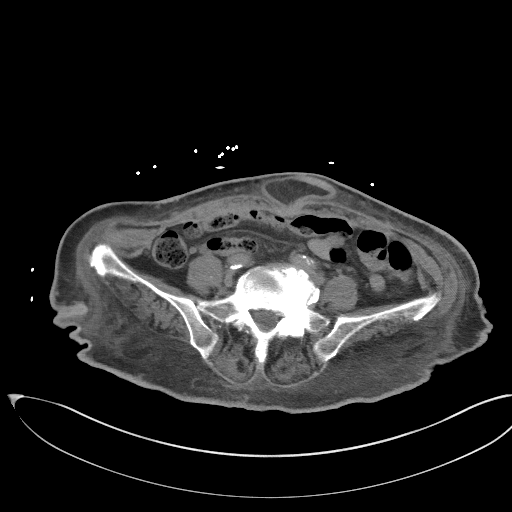
[im 43/85  soft-tissue]
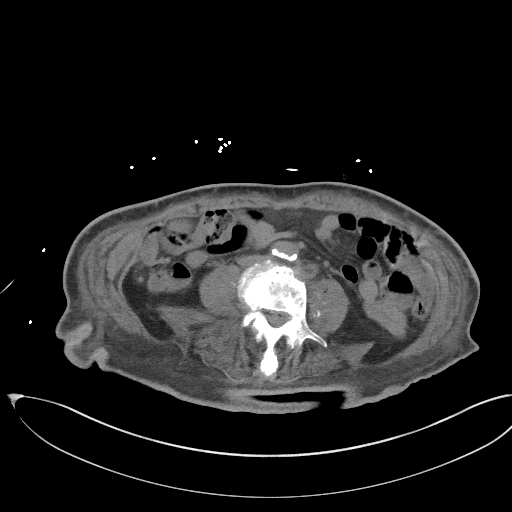
[im 50/85  soft-tissue]
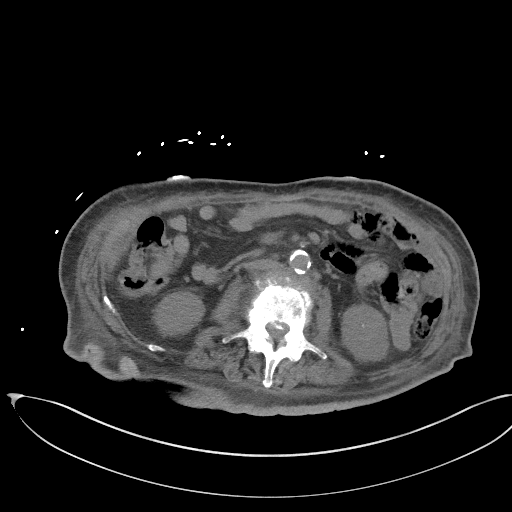
[im 57/85  soft-tissue]
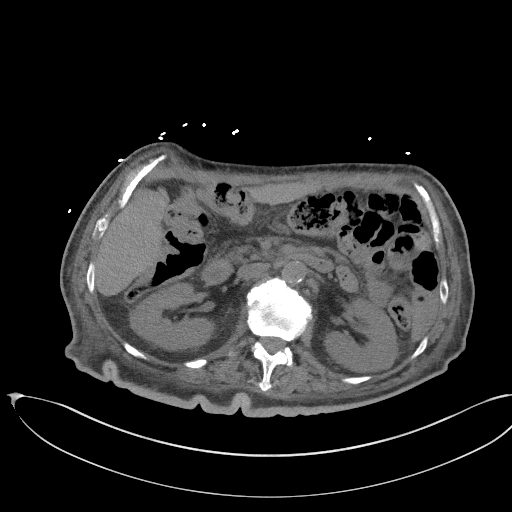
[im 57/85  bone]
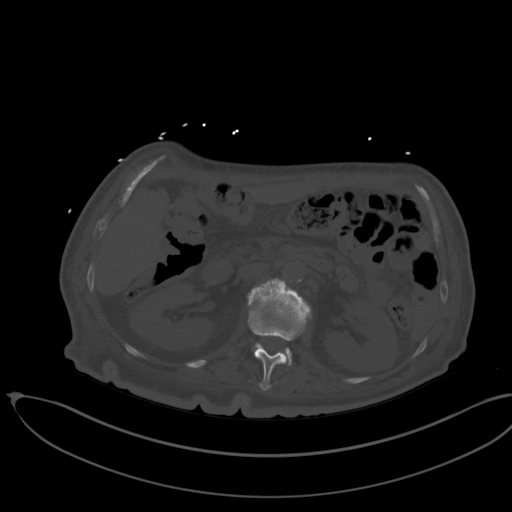
[im 60/85  soft-tissue]
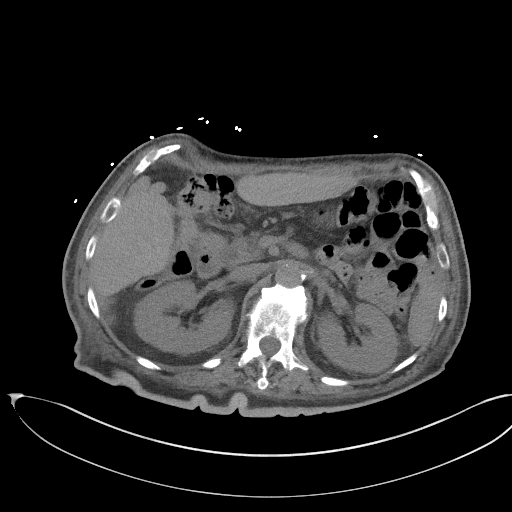
[im 67/85  soft-tissue]
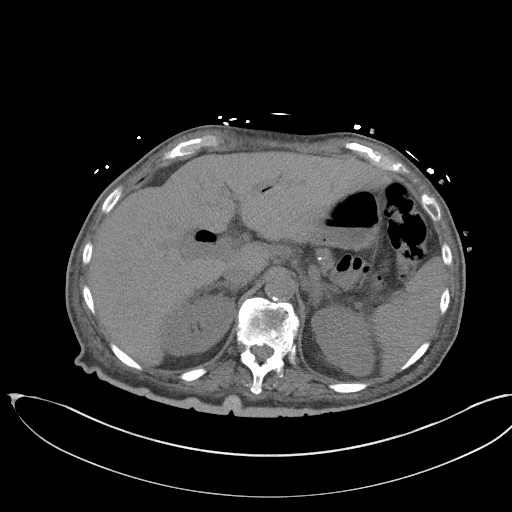
[im 74/85  soft-tissue]
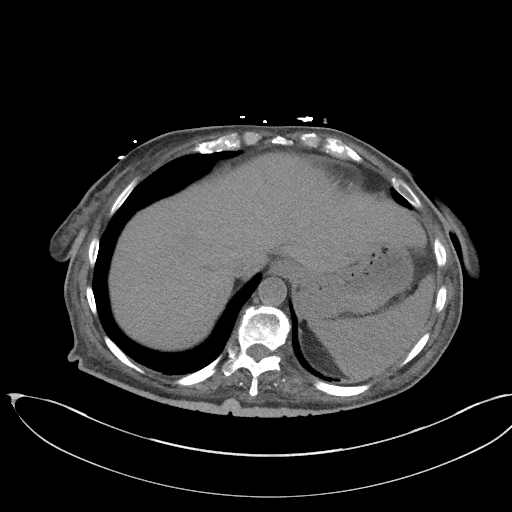
[im 81/85  soft-tissue]
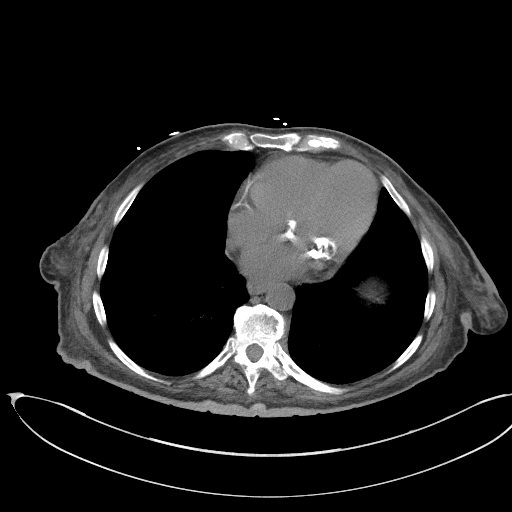

[Series 5: coronal st · coronal · 0.74mm/px · 3 of 95 slices shown]
[im 32/95  soft-tissue]
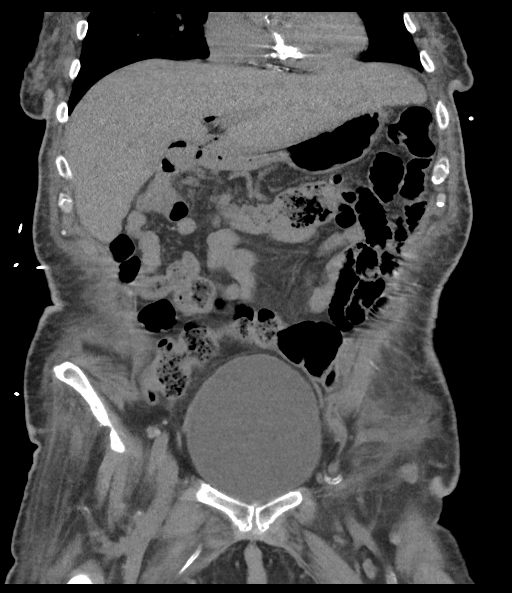
[im 42/95  soft-tissue]
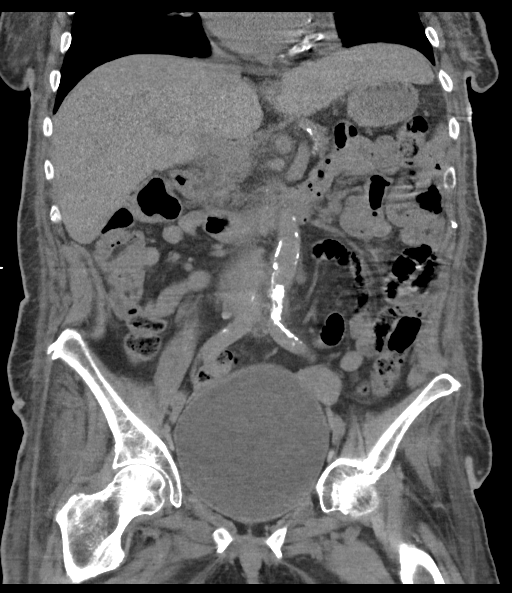
[im 53/95  soft-tissue]
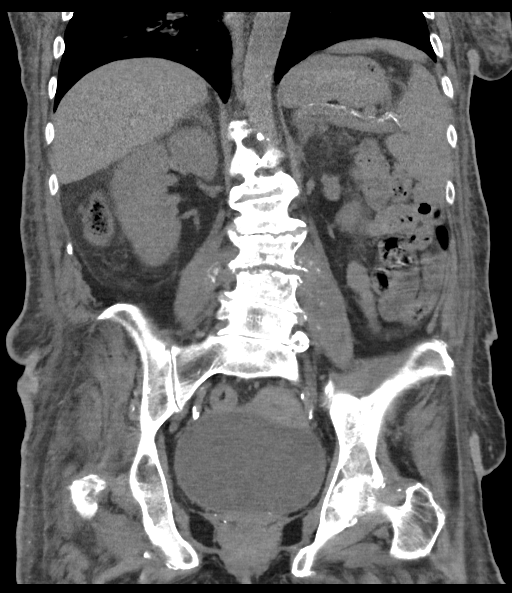

[16 of 46 positions shown; findings below may reference images not displayed]

FINDINGS: Lower chest: No acute abnormality.

Hepatobiliary: Solitary gallstone is noted. No biliary dilatation is
noted. However left hepatic pneumobilia is noted.

Pancreas: Unremarkable. No pancreatic ductal dilatation or
surrounding inflammatory changes.

Spleen: Normal in size without focal abnormality.

Adrenals/Urinary Tract: Adrenal glands appear normal. Small
nonobstructive left renal calculus is noted. No hydronephrosis or
renal obstruction is noted. Mild urinary bladder distention is
noted.

Stomach/Bowel: Stomach is within normal limits. Appendix appears
normal. No evidence of bowel wall thickening, distention, or
inflammatory changes.

Vascular/Lymphatic: Aortic atherosclerosis. No enlarged abdominal or
pelvic lymph nodes.

Reproductive: Uterus and bilateral adnexa are unremarkable.

Other: Moderate size fat containing periumbilical hernia is noted in
the pelvis. No ascites is noted.

Musculoskeletal: Un healed comminuted fracture is seen involving the
greater tuberosity of the proximal right femur. Multilevel
degenerative disc disease is noted in the lumbar spine. There
appears to be lytic destruction involving the adjacent articular
surfaces of L3-4 disc space concerning for discitis and
osteomyelitis. MRI is recommended for further evaluation.
IMPRESSION: 1. Unhealed comminuted fracture is seen involving the greater
tuberosity of the proximal right femur.
2. There appears to be lytic destruction involving the adjacent
vertebral endplates of L3-4 disc space concerning for discitis and
osteomyelitis. MRI is recommended for further evaluation.
3. Solitary gallstone is noted. Left hepatic pneumobilia is noted
which may be due to prior sphincterotomy.
4. Small nonobstructive left renal calculus.
5. Moderate size fat containing periumbilical hernia.

Aortic Atherosclerosis ([88]-[88]).

## 2019-05-19 IMAGING — MR MR CERVICAL SPINE WO/W CM
12 series · 45 of 48 positions shown · IV contrast (gadavist)
Comparison: CT earlier same day.

CLINICAL DATA: Fall with trauma to the head and neck.

EXAM:
MRI CERVICAL SPINE WITHOUT AND WITH CONTRAST
TECHNIQUE: Multiplanar and multiecho pulse sequences of the cervical spine, to
include the craniocervical junction and cervicothoracic junction,
were obtained without and with intravenous contrast.
CONTRAST:  5mL GADAVIST GADOBUTROL 1 MMOL/ML IV SOLN

[Series 20: ax dwi_tracew · axial · 3.0mm · 0.60mm/px · z∈[+3,+164]mm · 5 of 55 slices shown]
[im 1/55]
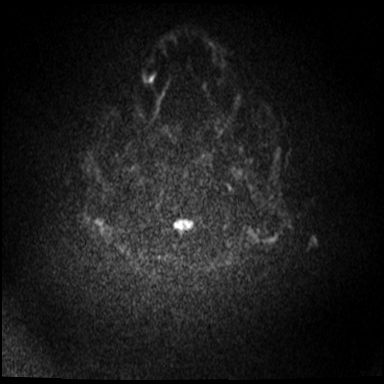
[im 14/55]
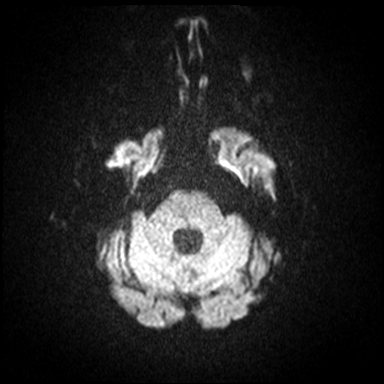
[im 28/55]
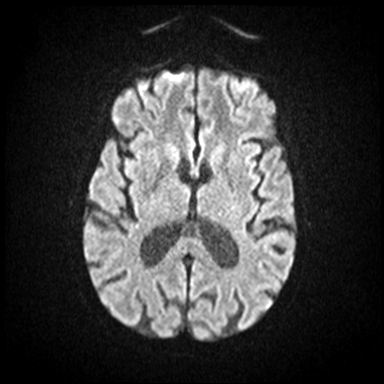
[im 41/55]
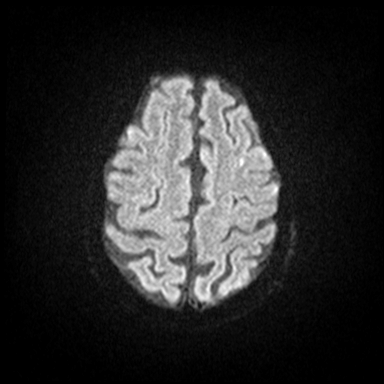
[im 55/55]
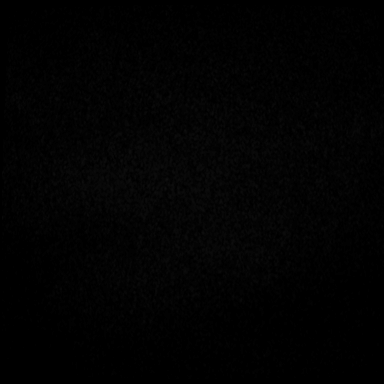

[Series 21: ax dwi_adc · axial · 3.0mm · 0.60mm/px · z∈[+3,+158]mm · 4 of 53 slices shown]
[im 1/53]
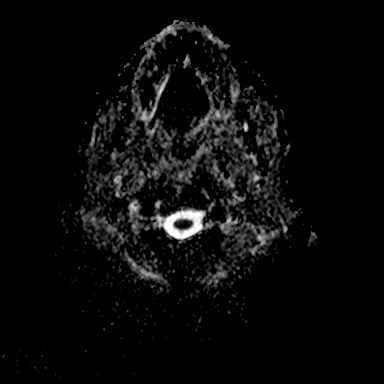
[im 18/53]
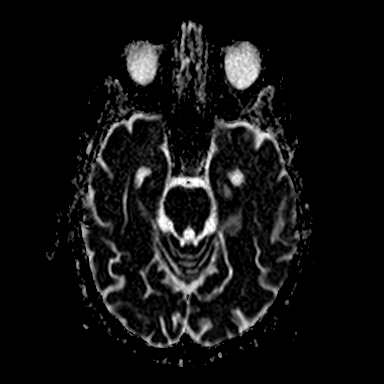
[im 35/53]
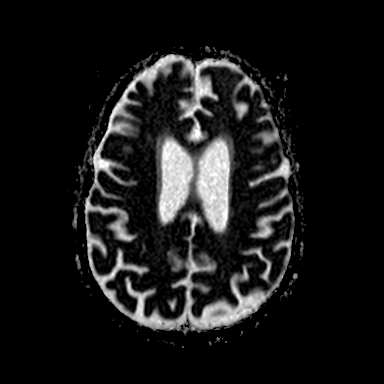
[im 53/53]
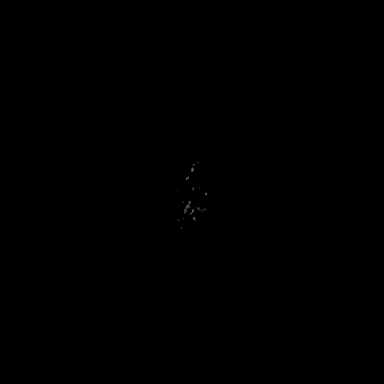

[Series 22: cor dwi_tracew · coronal · 5.0mm · 0.60mm/px · 3 of 45 slices shown]
[im 1/45]
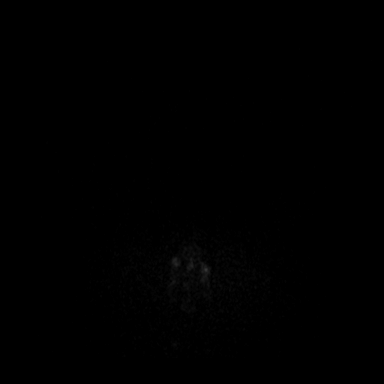
[im 23/45]
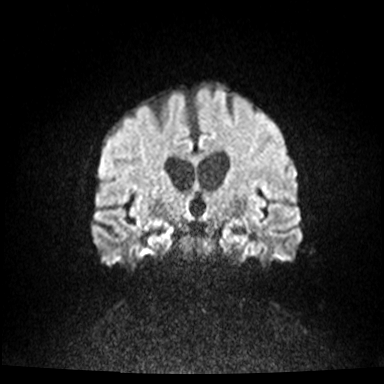
[im 45/45]
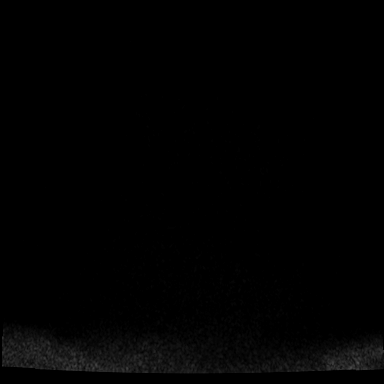

[Series 23: cor dwi_adc · coronal · 5.0mm · 0.60mm/px · 3 of 45 slices shown]
[im 1/45]
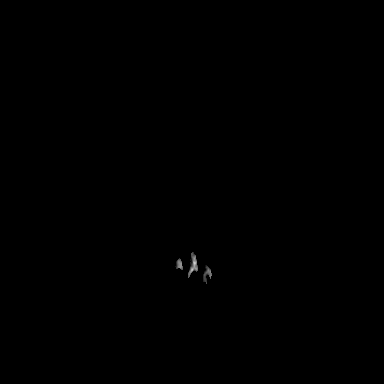
[im 23/45]
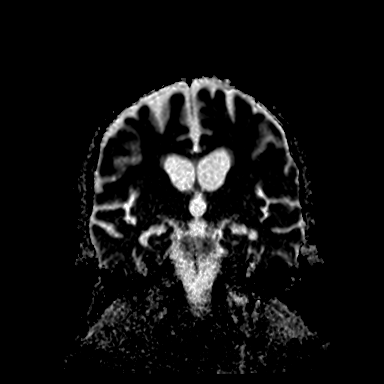
[im 45/45]
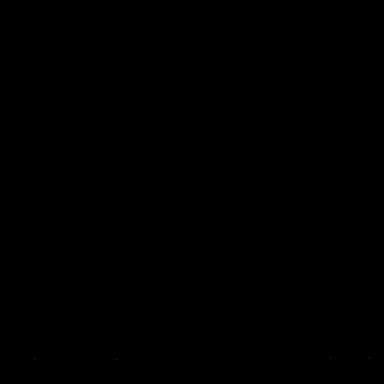

[Series 24: T1 · sagittal · 5.0mm · 0.62mm/px · 2 of 23 slices shown (1 of 2)]
[im 1/23]
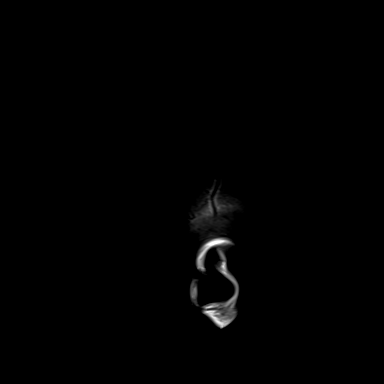
[im 23/23]
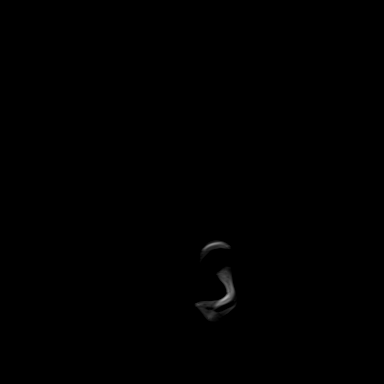

[Series 25: T2 · axial · 5.0mm · 0.53mm/px · z∈[+9,+163]mm · 2 of 27 slices shown (1 of 2)]
[im 1/27]
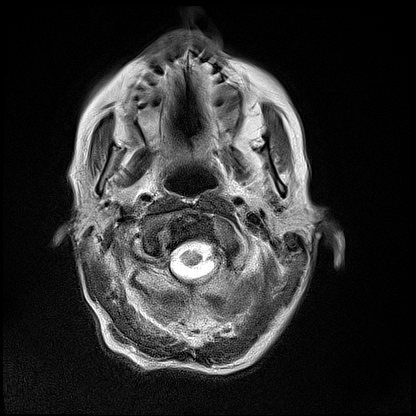
[im 27/27]
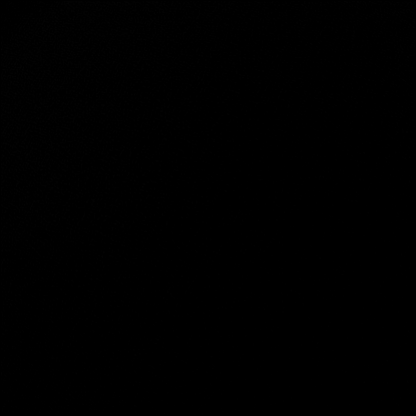

[Series 26: mag_images · axial · 3.0mm · 0.90mm/px · z∈[-1,+174]mm · 4 of 60 slices shown]
[im 1/60]
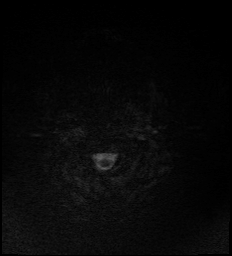
[im 20/60]
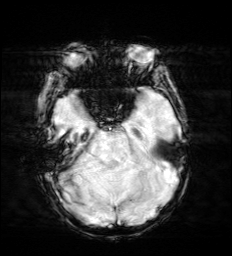
[im 40/60]
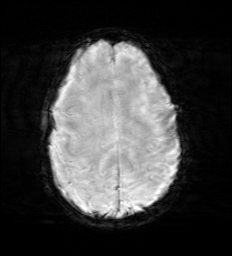
[im 60/60]
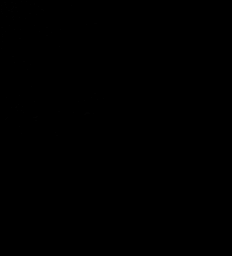

[Series 27: pha_images · axial · 3.0mm · 0.90mm/px · z∈[-1,+169]mm · 4 of 57 slices shown]
[im 1/57]
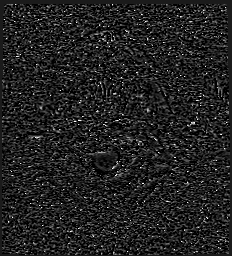
[im 19/57]
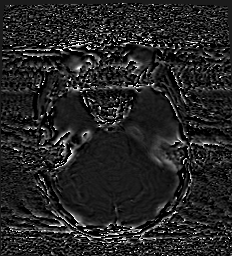
[im 38/57]
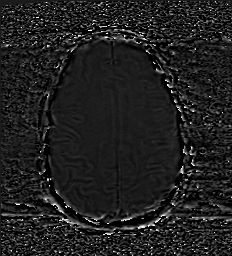
[im 57/57]
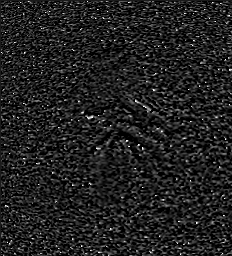

[Series 28: swi_images · axial · 3.0mm · 0.90mm/px · z∈[-1,+174]mm · 4 of 60 slices shown]
[im 1/60]
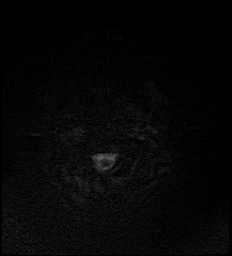
[im 20/60]
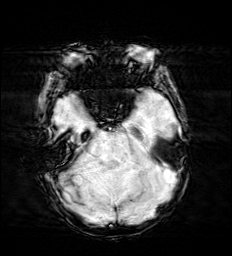
[im 40/60]
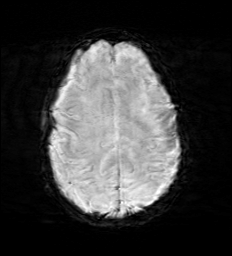
[im 60/60]
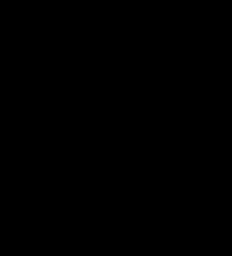

[Series 30: FLAIR · axial · 3.0mm · 0.53mm/px · 1 of 55 slices shown]
[im 1/55]
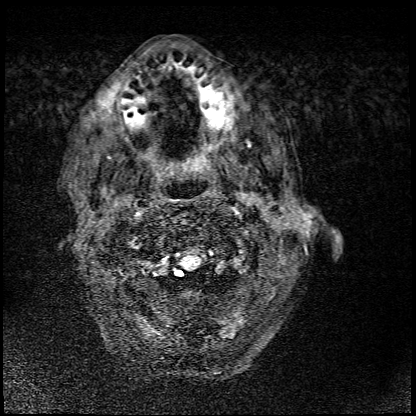

[Series 31: T1 · axial · 1.0mm · 0.98mm/px · z∈[+7,+156]mm · 11 of 150 slices shown (2 of 2)]
[im 1/150]
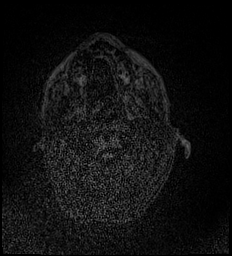
[im 15/150]
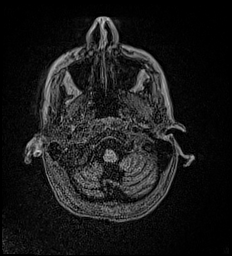
[im 30/150]
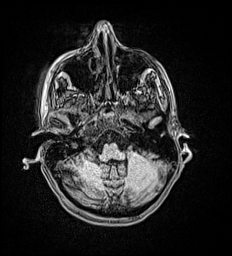
[im 45/150]
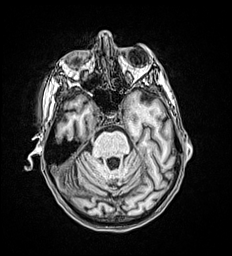
[im 60/150]
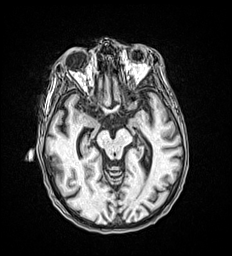
[im 75/150]
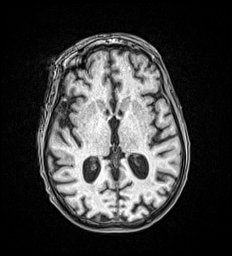
[im 90/150]
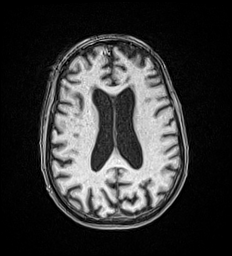
[im 105/150]
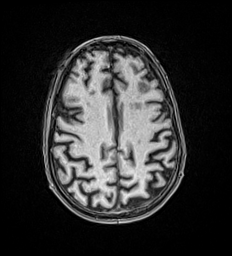
[im 120/150]
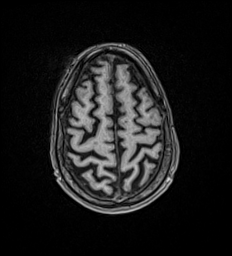
[im 135/150]
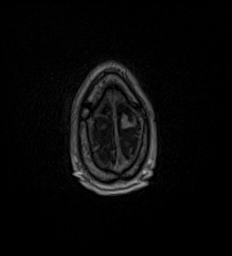
[im 150/150]
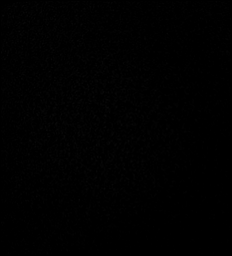

[Series 37: T2 · coronal · 5.0mm · 0.57mm/px · 2 of 29 slices shown (2 of 2)]
[im 1/29]
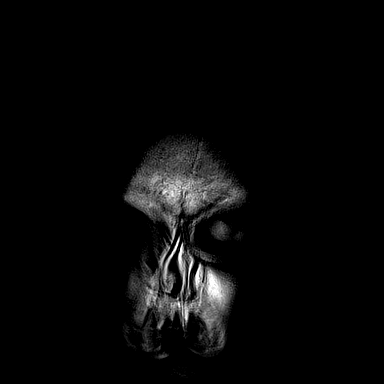
[im 29/29]
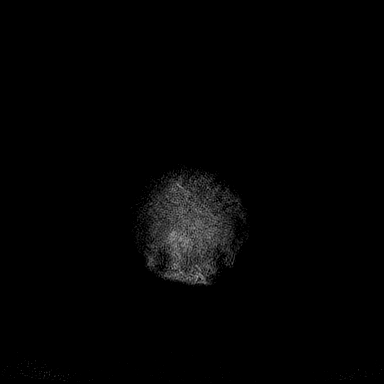

[45 of 48 positions shown; findings below may reference images not displayed]

FINDINGS: The study suffers from considerable motion degradation.

Alignment: Straightening of the normal cervical lordosis. No
traumatic malalignment.

Vertebrae: No evidence of any fracture or bone marrow edema.

Cord: No cord compression or primary cord lesion.

Posterior Fossa, vertebral arteries, paraspinal tissues: See results
of brain MRI.

Disc levels:

Limited detail because of motion. Degenerative spondylosis from C3-4
through C7-T1. Narrowing of the ventral subarachnoid space but no
compression of the cord. Mild bilateral foraminal narrowing
throughout that region.
IMPRESSION: Motion degraded exam with limited detail. The study does not suggest
any traumatic injury to the cervical spine or spinal cord.

Degenerative spondylosis C3-4 through C7-T1. No compressive canal
stenosis. Bilateral foraminal narrowing through that region is
likely chronic.

## 2019-05-19 IMAGING — CT CT CERVICAL SPINE W/O CM
3 of 4 series · 9 of 33 positions shown, 11 images · non-contrast
Comparison: Head CT [DATE], CT cervical spine [DATE].

CLINICAL DATA: Head trauma, headache. Neck trauma, uncomplicated.
Additional history provided: Reported fall last night hitting head.

EXAM:
CT HEAD WITHOUT CONTRAST
CT CERVICAL SPINE WITHOUT CONTRAST
TECHNIQUE: Multidetector CT imaging of the head and cervical spine was
performed following the standard protocol without intravenous
contrast. Multiplanar CT image reconstructions of the cervical spine
were also generated.

[Series 6: sagittal bone · sagittal · 0.23mm/px · 5 of 50 slices shown, 6 images]
[im 17/50  bone]
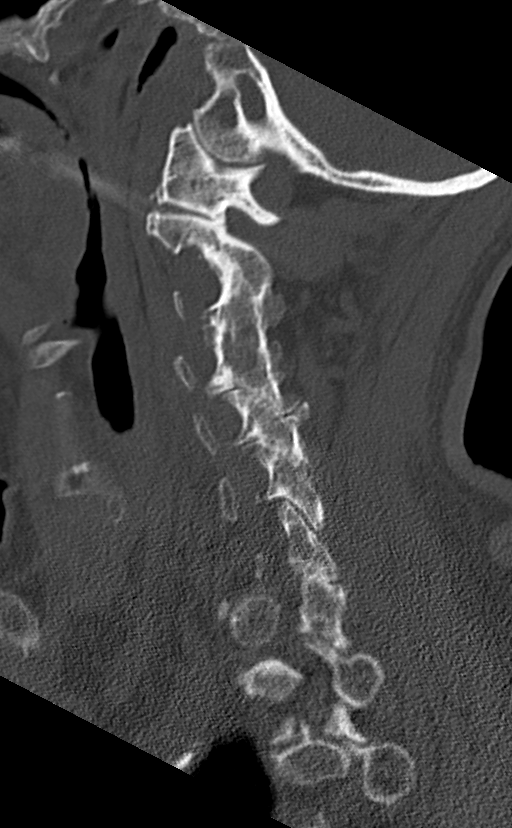
[im 21/50  bone]
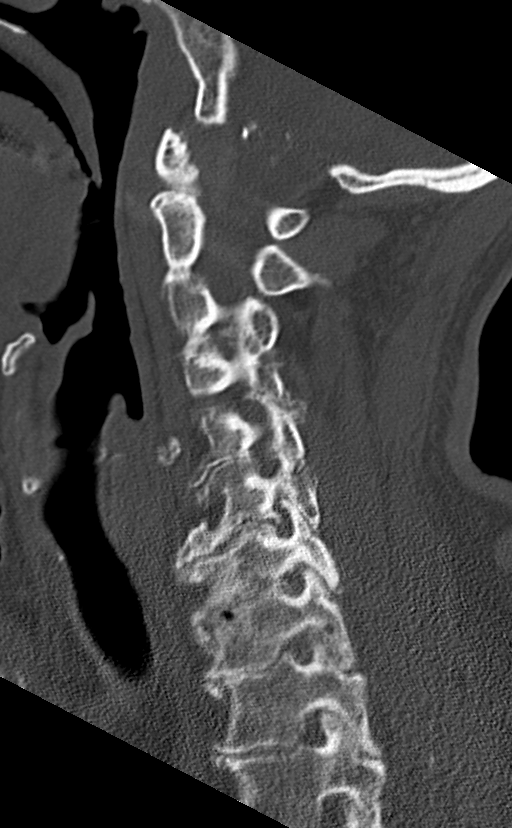
[im 25/50  soft-tissue]
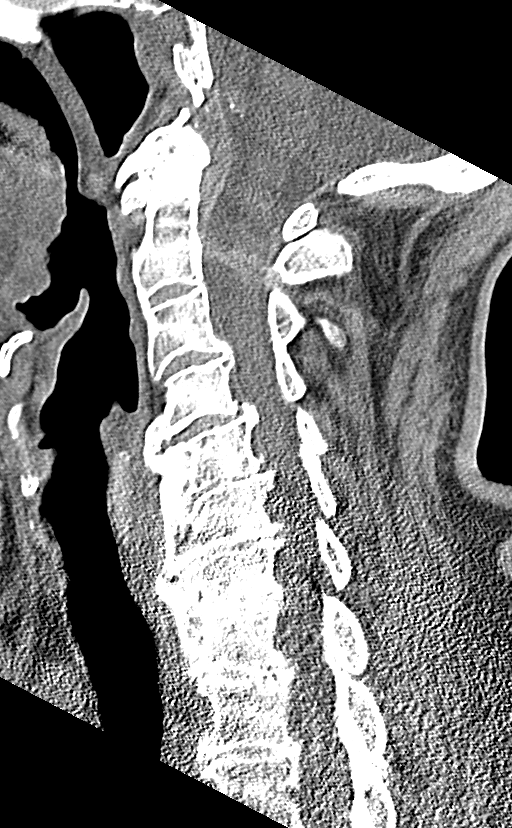
[im 25/50  bone]
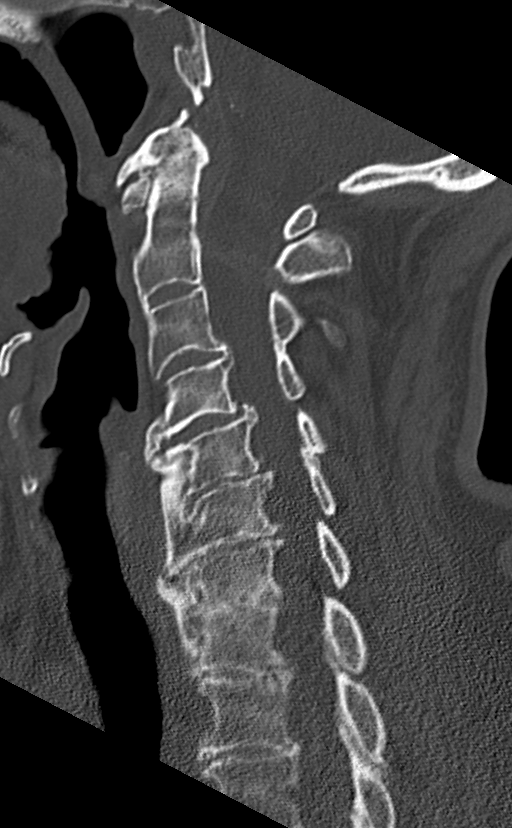
[im 29/50  bone]
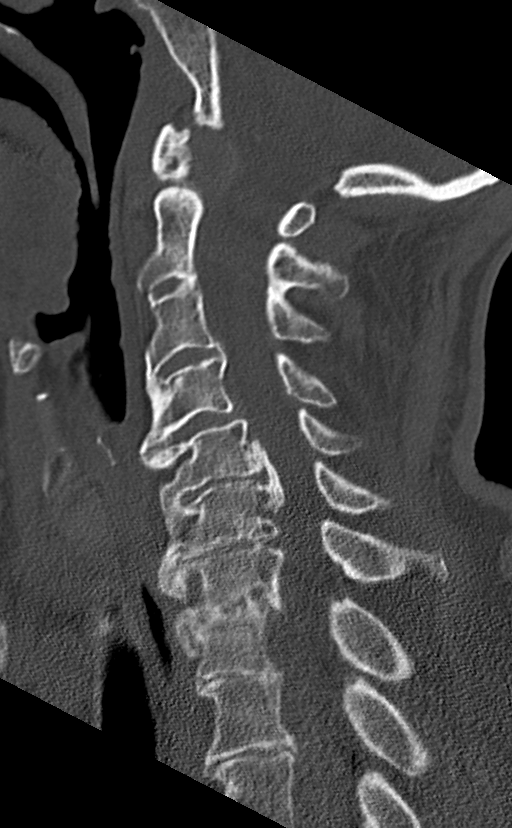
[im 33/50  bone]
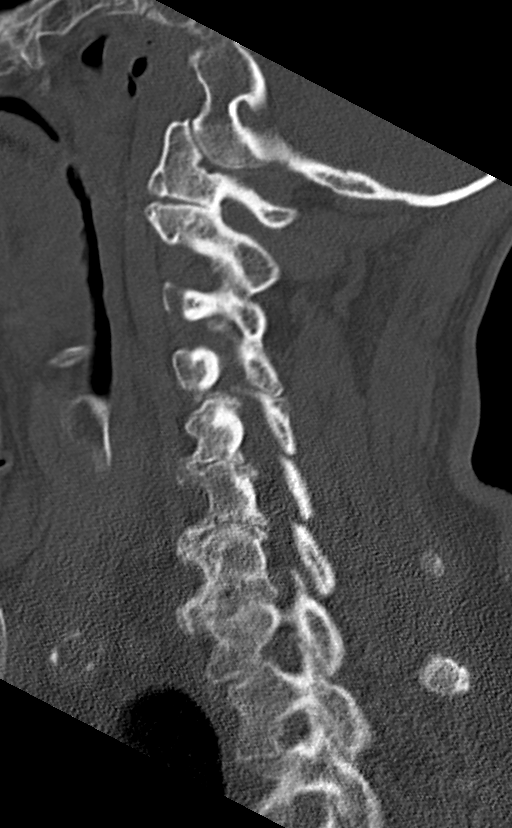

[Series 7: coronal bone · coronal · 0.20mm/px · 3 of 61 slices shown]
[im 14/61  bone]
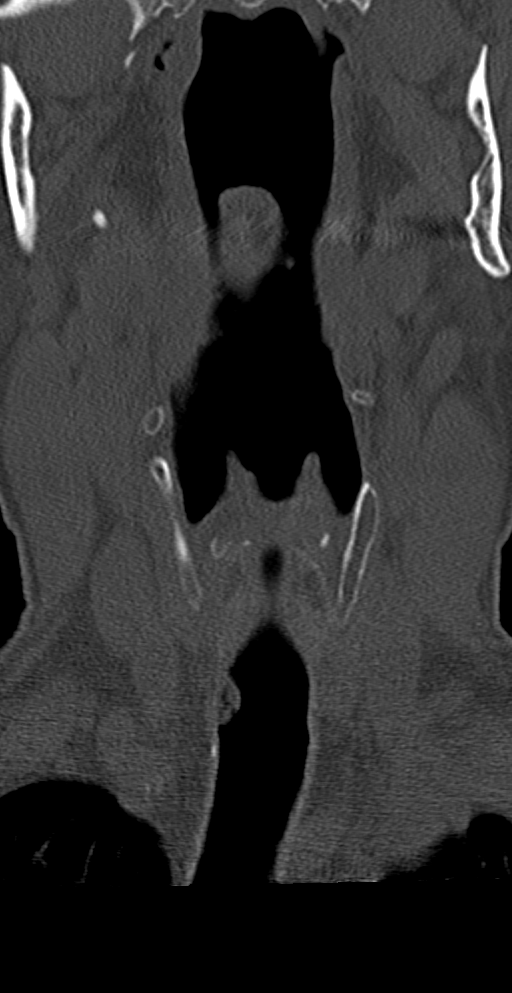
[im 25/61  bone]
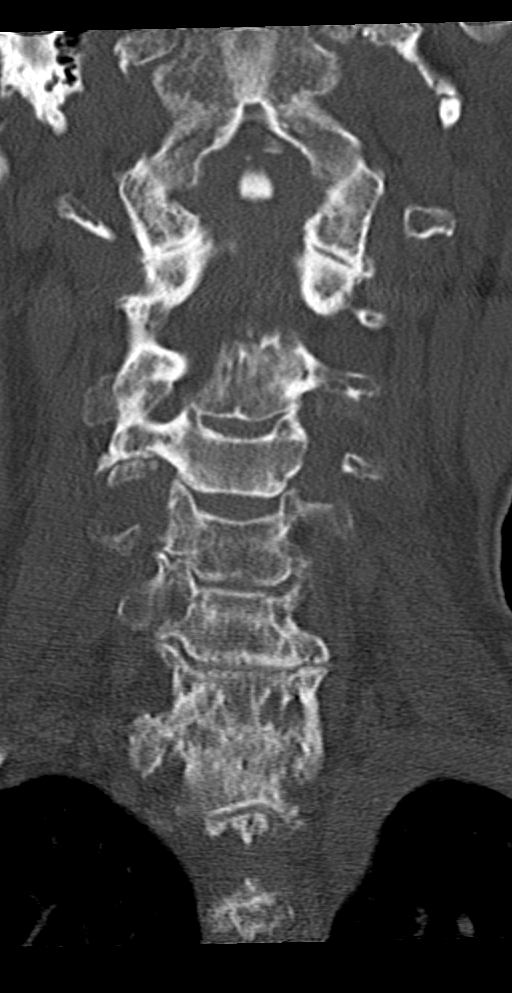
[im 36/61  bone]
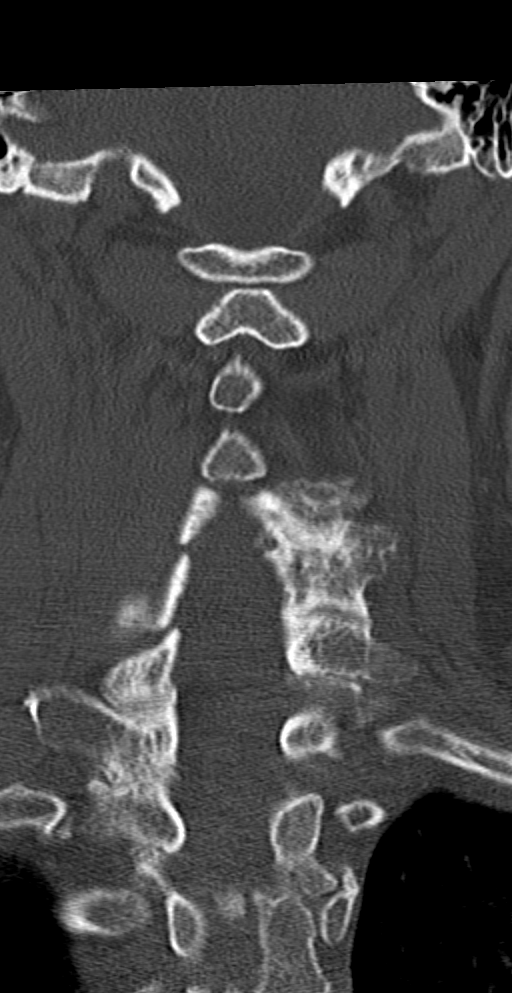

[Series 8: orthogonal bone · axial · 0.20mm/px · z∈[+37,+37]mm · 1 of 98 slices shown, 2 images]
[im 56/98  soft-tissue]
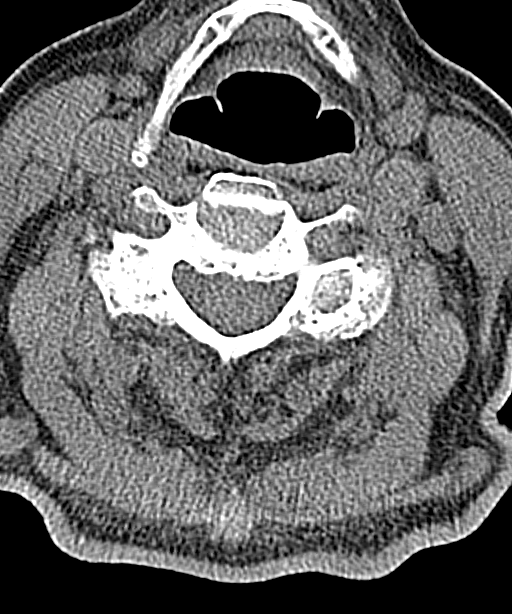
[im 56/98  bone]
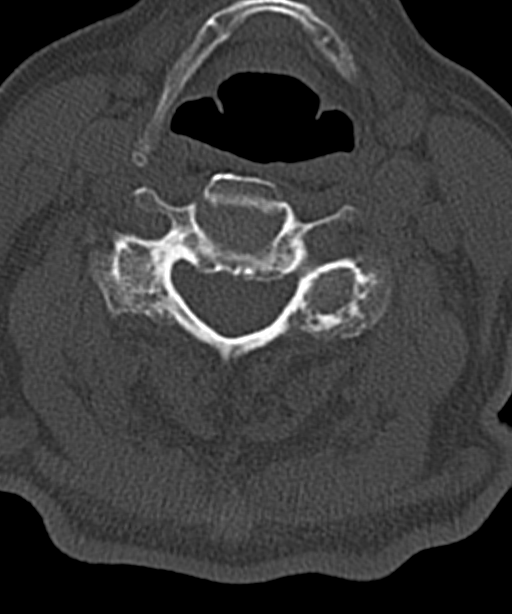

[9 of 33 positions shown; findings below may reference images not displayed]

FINDINGS: CT HEAD FINDINGS

Brain:

There is a small acute/subacute appearing cortical infarct within
the mid to anterior left frontal lobe. This was not present on prior

Scattered ill-defined hypoattenuation within the cerebral white
matter is unchanged. Stable, mild generalized parenchymal atrophy.

There is no acute intracranial hemorrhage.

No extra-axial fluid collection.

No evidence of intracranial mass.

No midline shift.

Vascular: No hyperdense vessel.  Atherosclerotic calcifications.

Skull: Normal. Negative for fracture or focal lesion.

Sinuses/Orbits: Visualized orbits show no acute finding. Minimal
ethmoid sinus mucosal thickening. Trace fluid within the bilateral
mastoid air cells.

CT CERVICAL SPINE FINDINGS

Alignment: Straightening of the expected cervical lordosis. Mild
C3-C4 and C4-C5 grade 1 anterolisthesis.

Skull base and vertebrae: The basion-dental and atlanto-dental
intervals are maintained.No evidence of acute fracture to the
cervical spine.

Soft tissues and spinal canal: No prevertebral fluid or swelling. No
visible canal hematoma.

Disc levels: Cervical spondylosis with multilevel disc space
narrowing, posterior disc osteophytes, uncovertebral and facet
hypertrophy. Disc space narrowing is severe at C5-C6, C6-C7, C7-T1,
T1-T2 and T2-T3. Ossification of the posterior longitudinal ligament
at C5-C6 and C6-C7. Multilevel bony spinal canal stenosis, greatest
at C5-C6 and C6-C7 (at least moderate in severity at these levels).
Bulky multilevel ventrolateral osteophytes. Redemonstrated
multilevel degenerative fusion.

Upper chest: No consolidation within the imaged lung apices. No
visible pneumothorax.

Other: Atherosclerotic plaque within the proximal major branch
vessels of the neck and bilateral carotid arteries.
IMPRESSION: CT head:

1. Small acute/subacute appearing cortically based infarct within
the mid to anterior left frontal lobe. This finding was not present
on prior head CT [DATE].
[DATE]. No acute intracranial hemorrhage
3. Stable nonspecific cerebral white matter disease, most commonly
due to small vessel ischemia.
4. Stable mild generalized parenchymal atrophy.
5. Trace bilateral mastoid effusions.

CT cervical spine:

1. No evidence of acute fracture to the cervical spine.
2. Cervical spondylosis with ossification of the posterior
longitudinal ligament, as described.
3. Unchanged C3-C4 and C4-C5 grade 1 anterolisthesis.

## 2019-05-19 IMAGING — CT CT HEAD W/O CM
3 series · 14 of 47 positions shown, 16 images · non-contrast
Comparison: Head CT [DATE], CT cervical spine [DATE].

CLINICAL DATA: Head trauma, headache. Neck trauma, uncomplicated.
Additional history provided: Reported fall last night hitting head.

EXAM:
CT HEAD WITHOUT CONTRAST
CT CERVICAL SPINE WITHOUT CONTRAST
TECHNIQUE: Multidetector CT imaging of the head and cervical spine was
performed following the standard protocol without intravenous
contrast. Multiplanar CT image reconstructions of the cervical spine
were also generated.

[Series 2: head wo · axial · 0.40mm/px · z∈[+131,+256]mm · 8 of 30 slices shown, 10 images]
[im 3/30  brain]
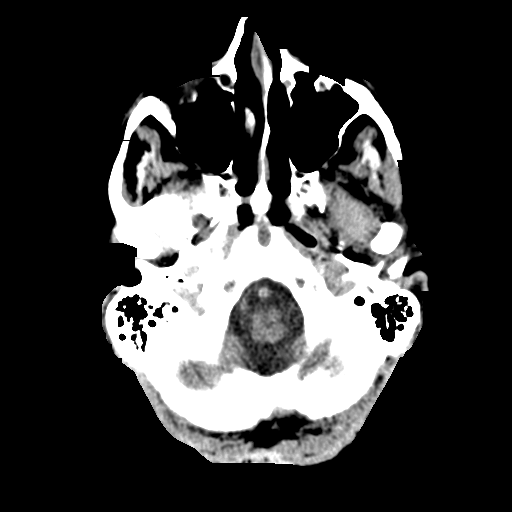
[im 3/30  bone]
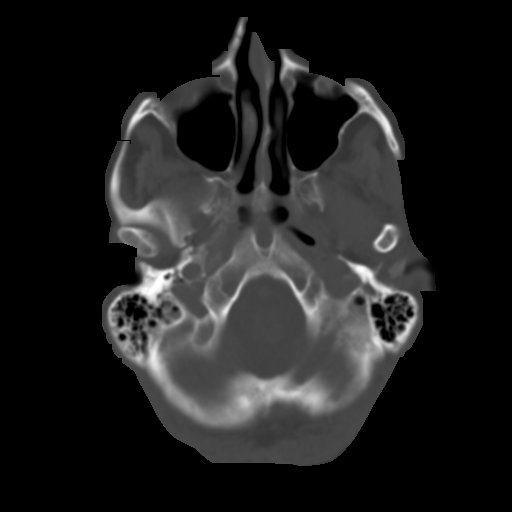
[im 7/30  brain]
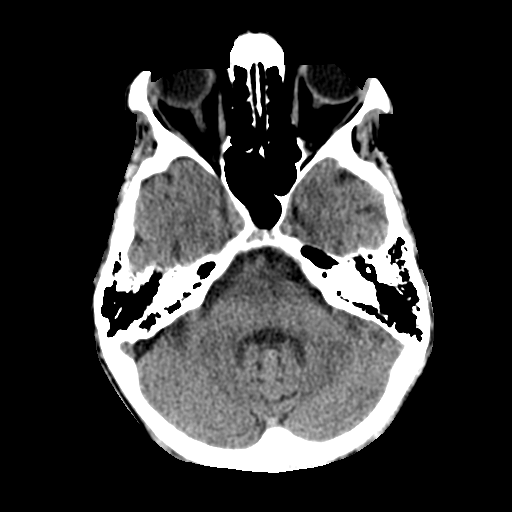
[im 10/30  brain]
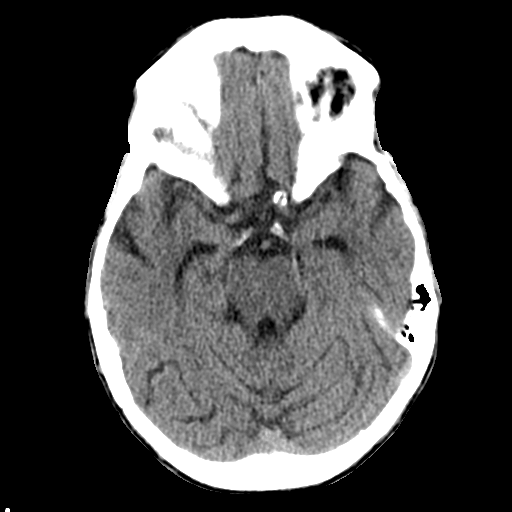
[im 14/30  brain]
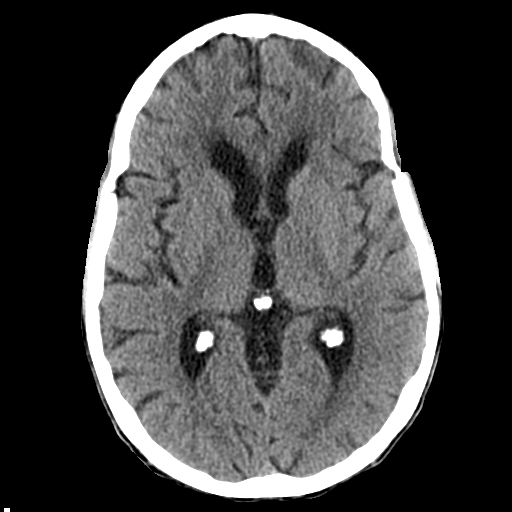
[im 17/30  brain]
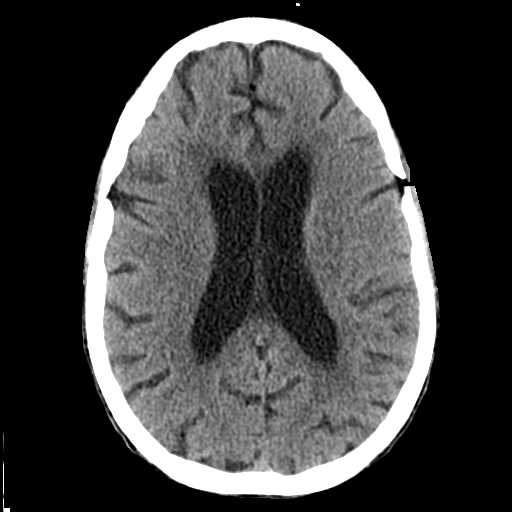
[im 17/30  bone]
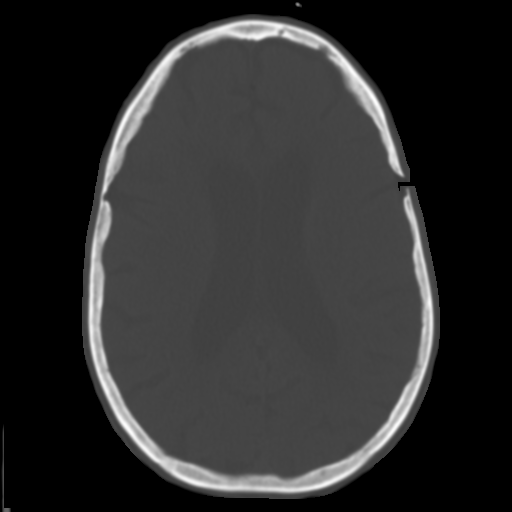
[im 21/30  brain]
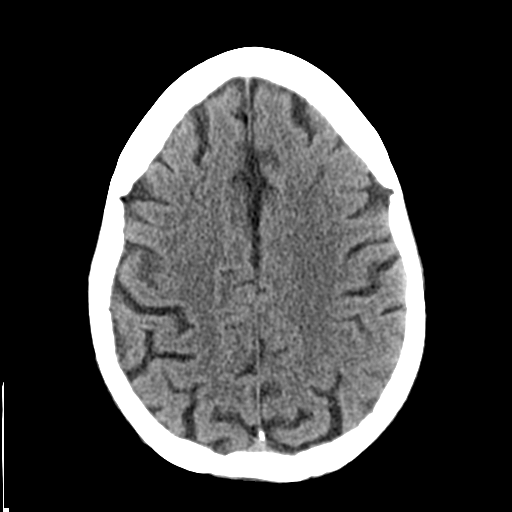
[im 24/30  brain]
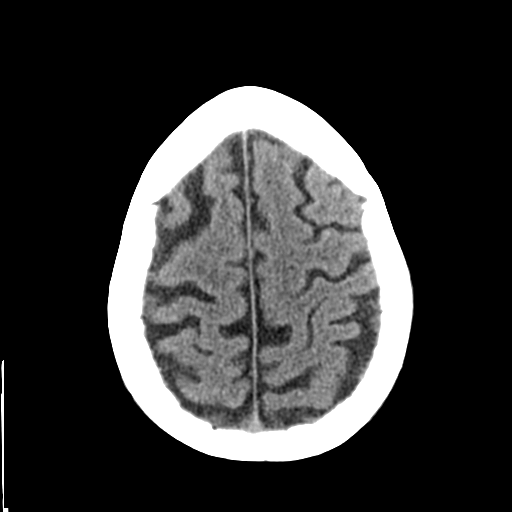
[im 28/30  brain]
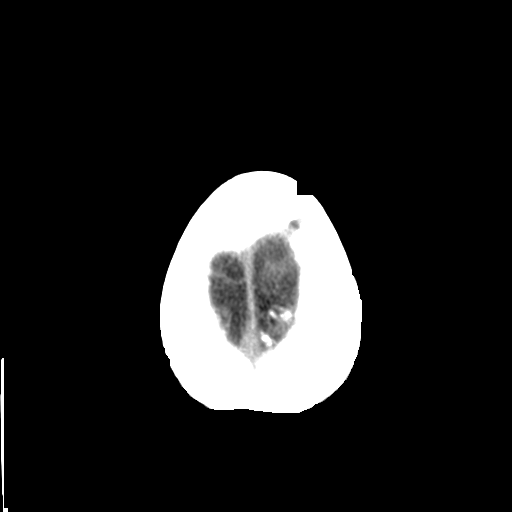

[Series 4: coronal soft tissue · coronal · 0.29mm/px · 3 of 70 slices shown]
[im 24/70  brain]
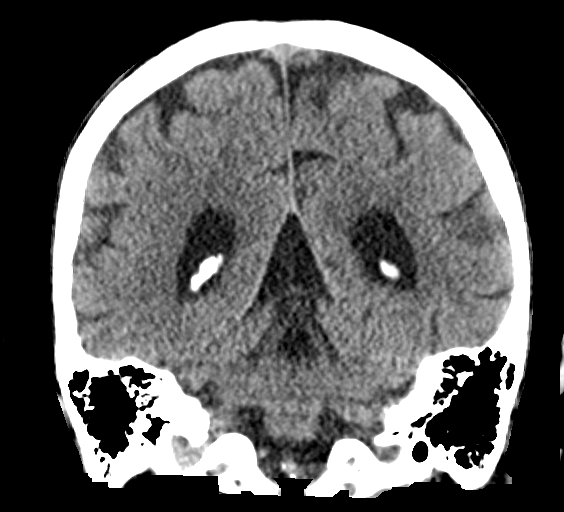
[im 31/70  brain]
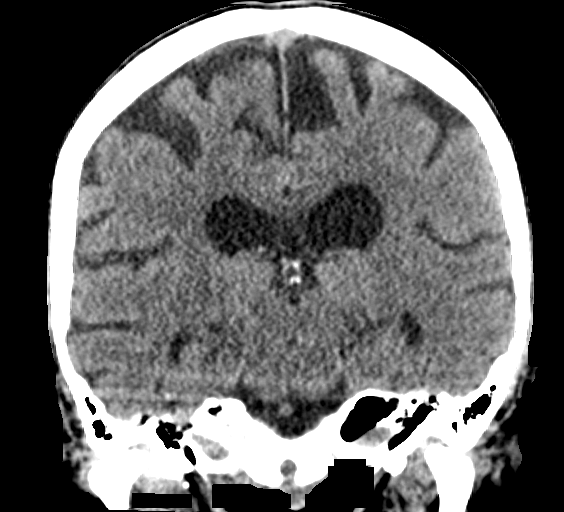
[im 39/70  brain]
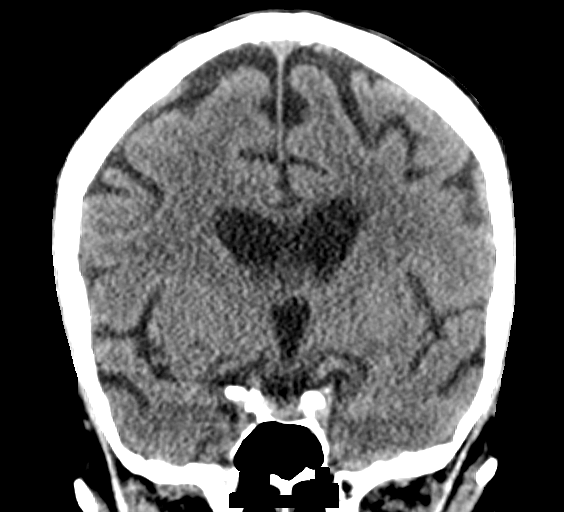

[Series 5: sagittal soft tissue · sagittal · 0.29mm/px · 3 of 55 slices shown]
[im 19/55  brain]
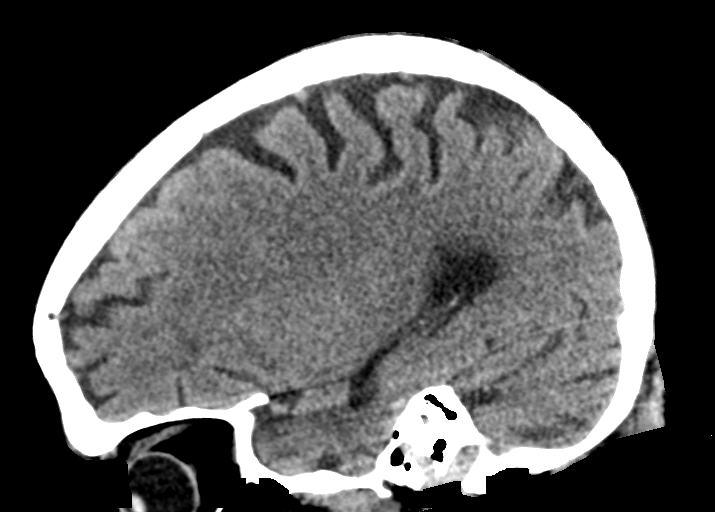
[im 28/55  brain]
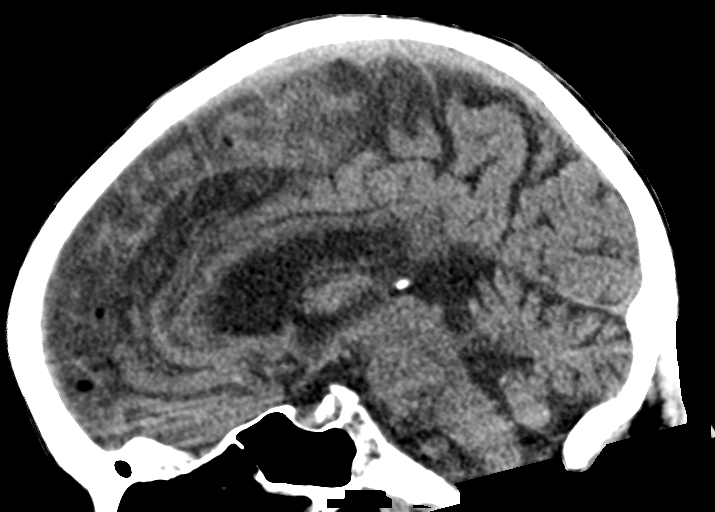
[im 37/55  brain]
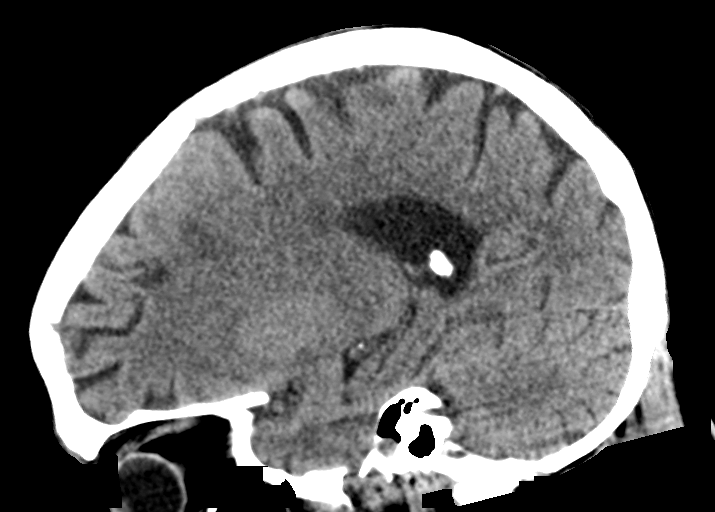

[14 of 47 positions shown; findings below may reference images not displayed]

FINDINGS: CT HEAD FINDINGS

Brain:

There is a small acute/subacute appearing cortical infarct within
the mid to anterior left frontal lobe. This was not present on prior

Scattered ill-defined hypoattenuation within the cerebral white
matter is unchanged. Stable, mild generalized parenchymal atrophy.

There is no acute intracranial hemorrhage.

No extra-axial fluid collection.

No evidence of intracranial mass.

No midline shift.

Vascular: No hyperdense vessel.  Atherosclerotic calcifications.

Skull: Normal. Negative for fracture or focal lesion.

Sinuses/Orbits: Visualized orbits show no acute finding. Minimal
ethmoid sinus mucosal thickening. Trace fluid within the bilateral
mastoid air cells.

CT CERVICAL SPINE FINDINGS

Alignment: Straightening of the expected cervical lordosis. Mild
C3-C4 and C4-C5 grade 1 anterolisthesis.

Skull base and vertebrae: The basion-dental and atlanto-dental
intervals are maintained.No evidence of acute fracture to the
cervical spine.

Soft tissues and spinal canal: No prevertebral fluid or swelling. No
visible canal hematoma.

Disc levels: Cervical spondylosis with multilevel disc space
narrowing, posterior disc osteophytes, uncovertebral and facet
hypertrophy. Disc space narrowing is severe at C5-C6, C6-C7, C7-T1,
T1-T2 and T2-T3. Ossification of the posterior longitudinal ligament
at C5-C6 and C6-C7. Multilevel bony spinal canal stenosis, greatest
at C5-C6 and C6-C7 (at least moderate in severity at these levels).
Bulky multilevel ventrolateral osteophytes. Redemonstrated
multilevel degenerative fusion.

Upper chest: No consolidation within the imaged lung apices. No
visible pneumothorax.

Other: Atherosclerotic plaque within the proximal major branch
vessels of the neck and bilateral carotid arteries.
IMPRESSION: CT head:

1. Small acute/subacute appearing cortically based infarct within
the mid to anterior left frontal lobe. This finding was not present
on prior head CT [DATE].
[DATE]. No acute intracranial hemorrhage
3. Stable nonspecific cerebral white matter disease, most commonly
due to small vessel ischemia.
4. Stable mild generalized parenchymal atrophy.
5. Trace bilateral mastoid effusions.

CT cervical spine:

1. No evidence of acute fracture to the cervical spine.
2. Cervical spondylosis with ossification of the posterior
longitudinal ligament, as described.
3. Unchanged C3-C4 and C4-C5 grade 1 anterolisthesis.

## 2019-05-19 IMAGING — DX DG CHEST 1V PORT
1 series · 1 of 1 positions shown · non-contrast
Comparison: [DATE]

CLINICAL DATA: Fall, altered mental status

EXAM:
PORTABLE CHEST 1 VIEW

[chest ap]
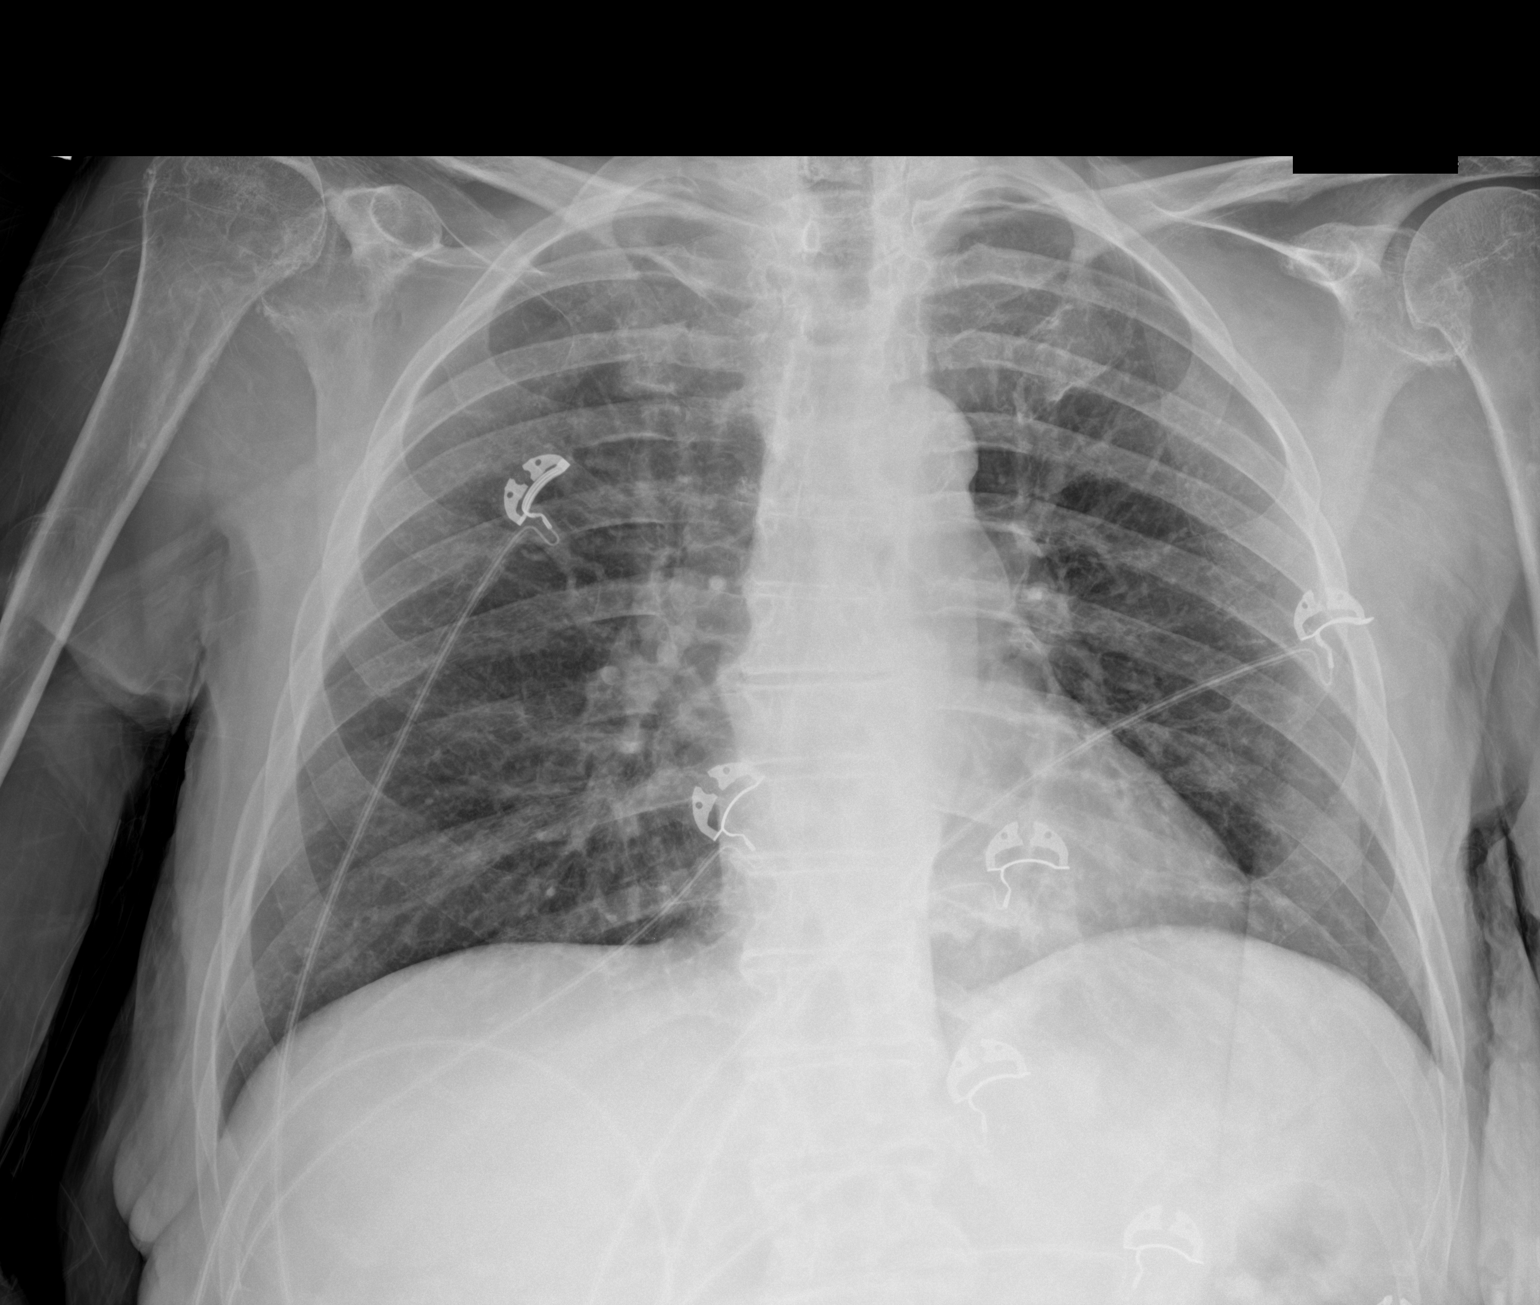

[1 of 1 positions shown; findings below may reference images not displayed]

FINDINGS: The heart size and mediastinal contours are within normal limits.
Interval resolution of previously seen right-sided airspace
consolidation. No focal airspace consolidation, pleural effusion, or
pneumothorax. Degenerative changes of the shoulders, right worse
than left.
IMPRESSION: No acute cardiopulmonary findings. Interval resolution of previously
seen right-sided airspace consolidation.

## 2019-05-19 MED ORDER — ACETAMINOPHEN 650 MG RE SUPP: 650.0000 mg | RECTAL | Status: DC | PRN

## 2019-05-19 MED ORDER — STROKE: EARLY STAGES OF RECOVERY BOOK: Freq: Once | Status: AC

## 2019-05-19 MED ORDER — ASPIRIN EC 325 MG PO TBEC
325.0000 mg | DELAYED_RELEASE_TABLET | Freq: Every day | ORAL | Status: DC
  Administered 2019-05-19 – 2019-05-28 (×10): 325 mg via ORAL
  Filled 2019-05-19 (×10): qty 1

## 2019-05-19 MED ORDER — SIMVASTATIN 10 MG PO TABS
10.0000 mg | ORAL_TABLET | Freq: Every day | ORAL | Status: DC
  Administered 2019-05-20 – 2019-05-27 (×8): 10 mg via ORAL
  Filled 2019-05-19 (×10): qty 1

## 2019-05-19 MED ORDER — OXYCODONE HCL 5 MG PO TABS
5.0000 mg | ORAL_TABLET | ORAL | Status: DC | PRN
  Administered 2019-05-19 – 2019-05-23 (×12): 5 mg via ORAL
  Filled 2019-05-19 (×13): qty 1

## 2019-05-19 MED ORDER — SODIUM CHLORIDE 0.9 % IV BOLUS
500.0000 mL | Freq: Once | INTRAVENOUS | Status: AC
  Administered 2019-05-19: 500 mL via INTRAVENOUS

## 2019-05-19 MED ORDER — POTASSIUM CHLORIDE 10 MEQ/100ML IV SOLN
10.0000 meq | INTRAVENOUS | Status: AC
  Administered 2019-05-19: 10 meq via INTRAVENOUS
  Filled 2019-05-19: qty 100

## 2019-05-19 MED ORDER — SODIUM CHLORIDE 0.45 % IV SOLN: INTRAVENOUS | Status: DC

## 2019-05-19 MED ORDER — LORAZEPAM 2 MG/ML IJ SOLN: 1.0000 mg | Freq: Once | INTRAMUSCULAR | Status: DC | PRN

## 2019-05-19 MED ORDER — ENOXAPARIN SODIUM 40 MG/0.4ML ~~LOC~~ SOLN
40.0000 mg | SUBCUTANEOUS | Status: DC
  Administered 2019-05-19: 17:00:00 40 mg via SUBCUTANEOUS
  Filled 2019-05-19: qty 0.4

## 2019-05-19 MED ORDER — ACETAMINOPHEN 160 MG/5ML PO SOLN
650.0000 mg | ORAL | Status: DC | PRN
  Filled 2019-05-19: qty 20.3

## 2019-05-19 MED ORDER — POTASSIUM CHLORIDE CRYS ER 20 MEQ PO TBCR
40.0000 meq | EXTENDED_RELEASE_TABLET | Freq: Once | ORAL | Status: AC
  Administered 2019-05-19: 40 meq via ORAL
  Filled 2019-05-19: qty 2

## 2019-05-19 MED ORDER — ACETAMINOPHEN 325 MG PO TABS
650.0000 mg | ORAL_TABLET | ORAL | Status: DC | PRN
  Administered 2019-05-23 – 2019-05-26 (×7): 650 mg via ORAL
  Filled 2019-05-19 (×8): qty 2

## 2019-05-19 MED ORDER — POTASSIUM CHLORIDE CRYS ER 20 MEQ PO TBCR
40.0000 meq | EXTENDED_RELEASE_TABLET | Freq: Once | ORAL | Status: AC
  Administered 2019-05-19: 22:00:00 40 meq via ORAL
  Filled 2019-05-19: qty 2

## 2019-05-19 MED ORDER — GLUCERNA 1.2 CAL PO LIQD
1000.0000 mL | Freq: Three times a day (TID) | ORAL | Status: DC
  Administered 2019-05-19: 237 mL via ORAL
  Administered 2019-05-20: 1000 mL via ORAL

## 2019-05-19 MED ORDER — MUPIROCIN 2 % EX OINT
1.0000 "application " | TOPICAL_OINTMENT | Freq: Two times a day (BID) | CUTANEOUS | Status: AC
  Administered 2019-05-19 – 2019-05-24 (×6): 1 via NASAL
  Filled 2019-05-19: qty 22

## 2019-05-19 MED ORDER — PNEUMOCOCCAL VAC POLYVALENT 25 MCG/0.5ML IJ INJ
0.5000 mL | INJECTION | INTRAMUSCULAR | Status: AC
  Administered 2019-05-22: 0.5 mL via INTRAMUSCULAR
  Filled 2019-05-19: qty 0.5

## 2019-05-19 MED ORDER — INSULIN ASPART 100 UNIT/ML ~~LOC~~ SOLN
0.0000 [IU] | Freq: Three times a day (TID) | SUBCUTANEOUS | Status: DC
  Administered 2019-05-20: 12:00:00 3 [IU] via SUBCUTANEOUS
  Administered 2019-05-20: 09:00:00 5 [IU] via SUBCUTANEOUS
  Administered 2019-05-20 – 2019-05-22 (×6): 3 [IU] via SUBCUTANEOUS
  Administered 2019-05-22 – 2019-05-23 (×2): 5 [IU] via SUBCUTANEOUS
  Administered 2019-05-23 – 2019-05-25 (×6): 3 [IU] via SUBCUTANEOUS
  Administered 2019-05-25: 2 [IU] via SUBCUTANEOUS
  Administered 2019-05-25 – 2019-05-26 (×3): 3 [IU] via SUBCUTANEOUS
  Administered 2019-05-27 – 2019-05-28 (×3): 2 [IU] via SUBCUTANEOUS
  Filled 2019-05-19 (×22): qty 1

## 2019-05-19 MED ORDER — GADOBUTROL 1 MMOL/ML IV SOLN
5.0000 mL | Freq: Once | INTRAVENOUS | Status: AC | PRN
  Administered 2019-05-19: 5 mL via INTRAVENOUS

## 2019-05-19 NOTE — H&P (Signed)
Alicia Andrade is an 59 y.o. female.   Chief Complaint: Mental status change, vomiting,  and falls with trauma to the back of her head from fall last night.  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 She has lost 120 lbs over the past 2 years. She has been having frequent falls. The patient lost so much weight that she actually believed that she no longer needed to take insulin. She was recently admitted to the hospital after she fell and broke her hip. She was found to have a glucose of more than 600.  Before that, however, she had been calling her sister and telling her that she was having difficulty at work learning new techniques and focusing. Since she has been admitted to SNF the patient has continued to have more alarming weight loss despite fair oral intake. She has a sacral pressure ulcer, recent sepsis due to UTI. She has known bipolar disorder, but her sister doesn't know if she takes her meds or not.   In the ED she is found to have severe hypokalemia, hypomagnesemia, leukocytosis, CT of the head has demonstrated an acute/subacute infarct of the mid to anterior left frontal lobe. There is also mild generalized parenchymal atrophy. CT of the abdomen demonstrated an unhealed comminuted fracture of the greater tuperosity of the proximal right femur. 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 There is a small non-obstructive left renal calculus and a moderate sized fat-containing periumbilical hernia. There is a solitary gallstone noted. There is left hepatic pneumobilia due to prior sphincterotomy.  She is found to have a low potassium level of 2.6 and a magnesium of 1.7. WBC is 12.7. She is COVID-19 negative.  Past Medical History:  Diagnosis Date  . Bipolar 1 disorder (HCC)   . Diabetes mellitus without complication (HCC)   . Hypokalemia    . Hypothyroidism   . Major depressive disorder   . Rhabdomyolysis   . Thrombocytopenia (HCC)     History reviewed. No pertinent surgical history.  No family history on file. Social History:  reports that she has never smoked. She has never used smokeless tobacco. No history on file for alcohol and drug. (Not in a hospital admission)   Allergies: Not on File  Review of systems not obtained due to patient factors.  General appearance: cooperative, slowed mentation and chronically ill appearing. Head: Normocephalic, without obvious abnormality, atraumatic Eyes: conjunctivae/corneas clear. PERRL, EOM's intact. Fundi benign. Throat: lips, mucosa, and tongue normal; teeth and gums normal Neck: no adenopathy, no carotid bruit, no JVD, supple, symmetrical, trachea midline and thyroid not enlarged, symmetric, no tenderness/mass/nodules Resp: No increased work of breathing. No wheezes, rales, or rhonchi. Chest wall: no tenderness Cardio: regular rate and rhythm, S1, S2 normal, no murmur, click, rub or gallop GI: soft, non-tender; bowel sounds normal; no masses,  no organomegaly Extremities: extremities normal, atraumatic, no cyanosis or edema Pulses: 2+ and symmetric Skin: Skin color, texture, turgor normal. No rashes or lesions Lymph nodes: Cervical, supraclavicular, and axillary nodes normal. Neurologic: The patient is confused. Moving all extremities Wound: The patient has a stage II sacral decubitus ulcer that is present on admission.  Results for orders placed or performed during the hospital encounter of 05/19/19 (from the past 48 hour(s))  CBC with Differential     Status: Abnormal   Collection Time:  05/19/19 11:53 AM  Result Value Ref Range   WBC 12.7 (H) 4.0 - 10.5 K/uL   RBC 3.64 (L) 3.87 - 5.11 MIL/uL   Hemoglobin 11.0 (L) 12.0 - 15.0 g/dL   HCT 94.1 (L) 74.0 - 81.4 %   MCV 88.7 80.0 - 100.0 fL   MCH 30.2 26.0 - 34.0 pg   MCHC 34.1 30.0 - 36.0 g/dL   RDW 48.1 85.6 - 31.4  %   Platelets 349 150 - 400 K/uL   nRBC 0.0 0.0 - 0.2 %   Neutrophils Relative % 90 %   Neutro Abs 11.5 (H) 1.7 - 7.7 K/uL   Lymphocytes Relative 6 %   Lymphs Abs 0.7 0.7 - 4.0 K/uL   Monocytes Relative 3 %   Monocytes Absolute 0.4 0.1 - 1.0 K/uL   Eosinophils Relative 0 %   Eosinophils Absolute 0.0 0.0 - 0.5 K/uL   Basophils Relative 0 %   Basophils Absolute 0.0 0.0 - 0.1 K/uL   Immature Granulocytes 1 %   Abs Immature Granulocytes 0.06 0.00 - 0.07 K/uL    Comment: Performed at [facility], [address] [place], [place] [address]  Comprehensive metabolic panel     Status: Abnormal   Collection Time: 05/19/19 11:53 AM  Result Value Ref Range   Sodium 134 (L) 135 - 145 mmol/L   Potassium 2.6 (LL) 3.5 - 5.1 mmol/L    Comment: CRITICAL RESULT CALLED TO, READ BACK BY AND VERIFIED WITH SAMANTHA HAMILTON 1233 05/19/2019 DB/QSD    Chloride 91 (L) 98 - 111 mmol/L   CO2 29 22 - 32 mmol/L   Glucose, Bld 242 (H) 70 - 99 mg/dL    Comment: Glucose reference range applies only to samples taken after fasting for at least 8 hours.   BUN 36 (H) 6 - 20 mg/dL   Creatinine, Ser 3.78 0.44 - 1.00 mg/dL   Calcium 8.8 (L) 8.9 - 10.3 mg/dL   Total Protein 8.2 (H) 6.5 - 8.1 g/dL   Albumin 3.0 (L) 3.5 - 5.0 g/dL   AST 15 15 - 41 U/L   ALT 10 0 - 44 U/L   Alkaline Phosphatase 161 (H) 38 - 126 U/L   Total Bilirubin 1.1 0.3 - 1.2 mg/dL   GFR calc non Af Amer >60 >60 mL/min   GFR calc Af Amer >60 >60 mL/min   Anion gap 14 5 - 15    Comment: Performed at [facility], [address] [place], [place] [address]  Troponin I (High Sensitivity)     Status: None   Collection Time: 05/19/19 11:53 AM  Result Value Ref Range   Troponin I (High Sensitivity) 5 <18 ng/L    Comment: (NOTE) Elevated high sensitivity troponin I (hsTnI) values and significant  changes across serial measurements may suggest ACS but many other  chronic and acute conditions are known to elevate hsTnI results.   Refer to the "Links" section for chest pain algorithms and additional  guidance. Performed at [facility], [address] [place], [place] [address]   Protime-INR     Status: None   Collection Time: 05/19/19 11:53 AM  Result Value Ref Range   Prothrombin Time 14.2 11.4 - 15.2 seconds   INR 1.1 0.8 - 1.2    Comment: (NOTE) INR goal varies based on device and disease states. Performed at [facility], [address] [place], [place] [address]   APTT     Status: None  Collection Time: 05/19/19 11:53 AM  Result Value Ref Range   aPTT 28 24 - 36 seconds    Comment: Performed at [facility], [address] [place], [place] [address]  Type and screen [facility]     Status: None   Collection Time: 05/19/19 11:53 AM  Result Value Ref Range   ABO/RH(D) O POS    Antibody Screen NEG    Sample Expiration      05/22/2019,2359 Performed at [facility], [address] [place], [place] [address]   Magnesium     Status: None   Collection Time: 05/19/19 11:53 AM  Result Value Ref Range   Magnesium 1.7 1.7 - 2.4 mg/dL    Comment: Performed at [facility], [address] [place], [place] [address]  Troponin I (High Sensitivity)     Status: None   Collection Time: 05/19/19  1:40 PM  Result Value Ref Range   Troponin I (High Sensitivity) 5 <18 ng/L    Comment: (NOTE) Elevated high sensitivity troponin I (hsTnI) values and significant  changes across serial measurements may suggest ACS but many other  chronic and acute conditions are known to elevate hsTnI results.  Refer to the "Links" section for chest pain algorithms and additional  guidance. Performed at [facility], [address] [place], [place] [address]   SARS Coronavirus 2 by RT PCR (hospital order, performed in [facility] hospital lab) Nasopharyngeal Nasopharyngeal Swab     Status: None   Collection Time: 05/19/19  1:47 PM   Specimen:  Nasopharyngeal Swab  Result Value Ref Range   SARS Coronavirus 2 NEGATIVE NEGATIVE    Comment: (NOTE) SARS-CoV-2 target nucleic acids are NOT DETECTED. The SARS-CoV-2 RNA is generally detectable in upper and lower respiratory specimens during the acute phase of infection. The lowest concentration of SARS-CoV-2 viral copies this assay can detect is 250 copies / mL. A negative result does not preclude SARS-CoV-2 infection and should not be used as the sole basis for treatment or other patient management decisions.  A negative result may occur with improper specimen collection / handling, submission of specimen other than nasopharyngeal swab, presence of viral mutation(s) within the areas targeted by this assay, and inadequate number of viral copies (<250 copies / mL). A negative result must be combined with clinical observations, patient history, and epidemiological information. Fact Sheet for Patients:   BoilerBrush.com.cy Fact Sheet for Healthcare Providers: https://pope.com/ This test is not yet approved or cleared  by the [place] FDA and has been authorized for detection and/or diagnosis of SARS-CoV-2 by FDA under an Emergency Use Authorization (EUA).  This EUA will remain in effect (meaning this test can be used) for the duration of the COVID-19 declaration under Section 564(b)(1) of the Act, 21 U.S.C. section 360bbb-3(b)(1), unless the authorization is terminated or revoked sooner. Performed at [facility], [address] [place], [place] [address]    @[email or web address] @  Blood pressure 96/71, pulse 98, temperature 97.7 F (36.5 C), resp. rate 11, height 5\' 2"  (1.575 m), weight 56.7 kg, SpO2 100 %.    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 Consult neurology and initiate a stroke work up to include Echocardiogram, MRI, and MRA of the neck. PT/OT eval and treat. Start ASA, statin, will be lenient  with BP control for the next 48 hours.   Lytic lesions in the lumbar spine: MRI of the cervical, throracic, and lumbar spine is pending.  SPEP and UPEP have been ordered.  Weight loss: Very fast, although patient states that it was intentional. It has continued to accelerate per the sister during her last 2 weeks in the SNF. Nutrition consult.  Hypotension: Monitor.  Falls: PT/OT eval and treat.  Bipolar disorder: Continue home medications.   DM II: Will follow FSBS with SSI. Home medications have not been reconciled. Carbohydrate modified diet. Soft.   Hypothyroidism: Check TSH. Continue levothyroxine as at home.  Major depressive syndrome: Continue home medications.  Rhabdomyolysis: Check CK. Monitor.  I have seen and examined this patient myself. I have spent 78 minutes in her evaluation and care.  DVT Prophylaxis: Lovenox CODE STATUS: Full Code Family Communication: Patient's sister, Benjamine Mola Disposition: Patient is from SNF. It is anticipated that she will dc to SNF. Barriers to discharge: Need for work up of lytic lesions, stroke work up, completion of evaluation including PT/OT.  Orvis Stann 05/19/2019, 3:59 PM

## 2019-05-19 NOTE — Progress Notes (Signed)
SLP Cancellation Note  Patient Details Name: Alicia Andrade MRN: [id number] DOB: 02-01-1960   Cancelled treatment:       Reason Eval/Treat Not Completed: (chart reviewed. ). Will hold on any Cognitive-linguistic assessment until pt is more stable and admitted to room from the ED. Pt just admitted to the ED at 11:30 this morning. Recommend monitoring for any difficulty w/ oral intake; recommend aspiration precautions including pills in Puree if needed. ST services will f/u in the morning.    Jerilynn Som, MS, CCC-SLP Taci Sterling 05/19/2019, 2:03 PM

## 2019-05-19 NOTE — ED Notes (Signed)
Nurse Sam informed of assigned bed 

## 2019-05-19 NOTE — Progress Notes (Signed)
OT Cancellation Note  Patient Details Name: Alicia Andrade MRN: [id number] DOB: 08/28/1960   Cancelled Treatment:    Reason Eval/Treat Not Completed: Medical issues which prohibited therapy  OT consult received and chart reviewed. Pt with K+ of 2.6 at this time which falls outside of therapy guidelines for safe participation in activity. Will f/u for occupational therapy evaluation as able/as pt becomes more appropriate. Thank you.  Rejeana Brock, MS, OTR/L ascom [phone] 05/19/19, 3:10 PM

## 2019-05-19 NOTE — Progress Notes (Signed)
   05/19/19 2331  Assess: MEWS Score  Temp 99.6 F (37.6 C)  BP 98/62  Pulse Rate (!) 102  Resp 17  SpO2 94 %  Assess: MEWS Score  MEWS Temp 0  MEWS Systolic 1  MEWS Pulse 1  MEWS RR 0  MEWS LOC 0  MEWS Score 2  MEWS Score Color Yellow  Assess: if the MEWS score is Yellow or Red  Were vital signs taken at a resting state? Yes  Focused Assessment Documented focused assessment  Early Detection of Sepsis Score *See Row Information* Low  MEWS guidelines implemented *See Row Information* Yes  Treat  MEWS Interventions Escalated (See documentation below)  Take Vital Signs  Increase Vital Sign Frequency  Yellow: Q 2hr X 2 then Q 4hr X 2, if remains yellow, continue Q 4hrs  Escalate  MEWS: Escalate Yellow: discuss with charge nurse/RN and consider discussing with provider and RRT  Notify: Charge Nurse/RN  Name of Charge Nurse/RN Notified Lupita Leash, RN  Date Charge Nurse/RN Notified 05/19/19  Time Charge Nurse/RN Notified 2335

## 2019-05-19 NOTE — ED Provider Notes (Signed)
[facility] Emergency Department Provider Note  ____________________________________________   First MD Initiated Contact with Patient 05/19/19 1133     (approximate)  I have reviewed the triage vital signs and the nursing notes.   HISTORY  Chief Complaint Altered Mental Status    HPI Alicia Andrade is a 59 y.o. female with bipolar who comes in from [place] who comes in for fall and altered mental status.  Patient reportedly had a fall last night and hit the back of her head.  They noted that initially she was doing her baseline self but then this morning she seemed to be more confused and had an episode of vomiting it sounds like.   Patient is able to state her name and her birthday but not able to give [place] clarity on what exactly happened yesterday therefore unable to get full HPI due to altered mental status  According to patient's POA/sister she is normally able to carry a conversation when she last was able talk to her a few days ago.  This is abnormal for her.  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  Has a remote history of stroke years and years ago but no no recent stroke.            Past Medical History:  Diagnosis Date  . Bipolar 1 disorder (HCC)   . Diabetes mellitus without complication (HCC)   . Hypokalemia   . Hypothyroidism   . Major depressive disorder   . Rhabdomyolysis   . Thrombocytopenia (HCC)     There are no problems to display for this patient.   History reviewed. No pertinent surgical history.  Prior to Admission medications   Not on File    Allergies Patient has no allergy information on record.  No family history on file.  Social History Unable to get full social history due to altered mental status  Review of Systems Unable to get full review of systems due to altered mental  status ____________________________________________   PHYSICAL EXAM:  VITAL SIGNS: ED Triage Vitals  Enc Vitals Group     BP 05/19/19 1134 96/71     Pulse Rate 05/19/19 1134 98     Resp 05/19/19 1134 17     Temp 05/19/19 1134 97.7 F (36.5 C)     Temp src --      SpO2 05/19/19 1134 97 %     Weight 05/19/19 1138 125 lb (56.7 kg)     Height 05/19/19 1138 5\' 2"  (1.575 m)     Head Circumference --      Peak Flow --      Pain Score 05/19/19 1138 0     Pain Loc --      Pain Edu? --      Excl. in GC? --     Constitutional: Alert and oriented x1.  Eyes: Conjunctivae are normal. EOMI. pupils reactive bilaterally Head: Atraumatic. Nose: No congestion/rhinnorhea. Mouth/Throat: Mucous membranes are moist.   Neck: No stridor. Trachea Midline. FROM Cardiovascular: Normal rate, regular rhythm. Grossly normal heart sounds.  Good peripheral circulation.  No chest wall tenderness Respiratory: Normal respiratory effort.  No retractions. Lungs CTAB. Gastrointestinal: Soft, maybe some tenderness but difficult to assess no distention. No abdominal bruits.  Musculoskeletal: No lower extremity tenderness nor edema.  No joint effusions. Neurologic: Oriented x1, squeezes bilateral hands, lifts up bilateral legs. Psychiatric: Unable to  fully assess due to altered mental status GU: Deferred   ____________________________________________   LABS (all labs ordered are listed, but only abnormal results are displayed)  Labs Reviewed  CBC WITH DIFFERENTIAL/PLATELET - Abnormal; Notable for the following components:      Result Value   WBC 12.7 (*)    RBC 3.64 (*)    Hemoglobin 11.0 (*)    HCT 32.3 (*)    Neutro Abs 11.5 (*)    All other components within normal limits  COMPREHENSIVE METABOLIC PANEL - Abnormal; Notable for the following components:   Sodium 134 (*)    Potassium 2.6 (*)    Chloride 91 (*)    Glucose, Bld 242 (*)    BUN 36 (*)    Calcium 8.8 (*)    Total Protein 8.2 (*)     Albumin 3.0 (*)    Alkaline Phosphatase 161 (*)    All other components within normal limits  SARS CORONAVIRUS 2 BY RT PCR (HOSPITAL ORDER, PERFORMED IN [facility])  PROTIME-INR  APTT  MAGNESIUM  URINALYSIS, COMPLETE (UACMP) WITH MICROSCOPIC  TYPE AND SCREEN  TROPONIN I (HIGH SENSITIVITY)  TROPONIN I (HIGH SENSITIVITY)   ____________________________________________   ED ECG REPORT I, Concha Se, the attending physician, personally viewed and interpreted this ECG.  EKG is normal sinus rhythm 96, some ST elevation in V2 V3 with a T wave inversion in lead III QTC prolonged at 520 ____________________________________________  RADIOLOGY I, Concha Se, personally viewed and evaluated these images (plain radiographs) as part of my medical decision making, as well as reviewing the written report by the radiologist.  ED MD interpretation:  No PNA  Official radiology report(s): CT ABDOMEN PELVIS WO CONTRAST  Result Date: 05/19/2019 CLINICAL DATA:  Status post fall. EXAM: CT ABDOMEN AND PELVIS WITHOUT CONTRAST TECHNIQUE: Multidetector CT imaging of the abdomen and pelvis was performed following the standard protocol without IV contrast. COMPARISON:  April 21, 2019. FINDINGS: Lower chest: No acute abnormality. Hepatobiliary: Solitary gallstone is noted. No biliary dilatation is noted. However left hepatic pneumobilia is noted. Pancreas: Unremarkable. No pancreatic ductal dilatation or surrounding inflammatory changes. Spleen: Normal in size without focal abnormality. Adrenals/Urinary Tract: Adrenal glands appear normal. Small nonobstructive left renal calculus is noted. No hydronephrosis or renal obstruction is noted. Mild urinary bladder distention is noted. Stomach/Bowel: Stomach is within normal limits. Appendix appears normal. No evidence of bowel wall thickening, distention, or inflammatory changes. Vascular/Lymphatic: Aortic atherosclerosis. No enlarged abdominal or pelvic  lymph nodes. Reproductive: Uterus and bilateral adnexa are unremarkable. Other: Moderate size fat containing periumbilical hernia is noted in the pelvis. No ascites is noted. Musculoskeletal: Un healed comminuted fracture is seen involving the greater tuberosity of the proximal right femur. Multilevel degenerative disc disease is noted in the lumbar spine. There appears to be lytic destruction involving the adjacent articular surfaces of L3-4 disc space concerning for discitis and osteomyelitis. MRI is recommended for further evaluation. IMPRESSION: 1. Unhealed comminuted fracture is seen involving the greater tuberosity of the proximal right femur. 2. There appears to be lytic destruction involving the adjacent vertebral endplates of L3-4 disc space concerning for discitis and osteomyelitis. MRI is recommended for further evaluation. 3. Solitary gallstone is noted. Left hepatic pneumobilia is noted which may be due to prior sphincterotomy. 4. Small nonobstructive left renal calculus. 5. Moderate size fat containing periumbilical hernia. Aortic Atherosclerosis (ICD10-I70.0). Electronically Signed   By: Lupita Raider M.D.   On: 05/19/2019 12:27  CT Head Wo Contrast  Result Date: 05/19/2019 CLINICAL DATA:  Head trauma, headache. Neck trauma, uncomplicated. Additional history provided: Reported fall last night hitting head. EXAM: CT HEAD WITHOUT CONTRAST CT CERVICAL SPINE WITHOUT CONTRAST TECHNIQUE: Multidetector CT imaging of the head and cervical spine was performed following the standard protocol without intravenous contrast. Multiplanar CT image reconstructions of the cervical spine were also generated. COMPARISON:  Head CT 04/19/2019, CT cervical spine 07/03/2018. FINDINGS: CT HEAD FINDINGS Brain: There is a small acute/subacute appearing cortical infarct within the mid to anterior left frontal lobe. This was not present on prior head CT 04/19/2019 (series 2, image 19) (series 4, image 16). Scattered  ill-defined hypoattenuation within the cerebral white matter is unchanged. Stable, mild generalized parenchymal atrophy. There is no acute intracranial hemorrhage. No extra-axial fluid collection. No evidence of intracranial mass. No midline shift. Vascular: No hyperdense vessel.  Atherosclerotic calcifications. Skull: Normal. Negative for fracture or focal lesion. Sinuses/Orbits: Visualized orbits show no acute finding. Minimal ethmoid sinus mucosal thickening. Trace fluid within the bilateral mastoid air cells. CT CERVICAL SPINE FINDINGS Alignment: Straightening of the expected cervical lordosis. Mild C3-C4 and C4-C5 grade 1 anterolisthesis. Skull base and vertebrae: The basion-dental and atlanto-dental intervals are maintained.No evidence of acute fracture to the cervical spine. Soft tissues and spinal canal: No prevertebral fluid or swelling. No visible canal hematoma. Disc levels: Cervical spondylosis with multilevel disc space narrowing, posterior disc osteophytes, uncovertebral and facet hypertrophy. Disc space narrowing is severe at C5-C6, C6-C7, C7-T1, T1-T2 and T2-T3. 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 Multilevel bony spinal canal stenosis, greatest at C5-C6 and C6-C7 (at least moderate in severity at these levels). Bulky multilevel ventrolateral osteophytes. Redemonstrated multilevel degenerative fusion. Upper chest: No consolidation within the imaged lung apices. No visible pneumothorax. Other: Atherosclerotic plaque within the proximal major branch vessels of the neck and bilateral carotid arteries. IMPRESSION: CT head: 1. Small acute/subacute appearing cortically based infarct within the mid to anterior left frontal lobe. This finding was not present on prior head CT 04/19/2019. 2. No acute intracranial hemorrhage 3. Stable nonspecific cerebral white matter disease, most commonly due to small vessel ischemia. 4. Stable mild generalized parenchymal atrophy. 5. Trace  bilateral mastoid effusions. CT cervical spine: 1. No evidence of acute fracture to the cervical spine. 2. 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 3. Unchanged C3-C4 and C4-C5 grade 1 anterolisthesis. Electronically Signed   By: Jackey Loge DO   On: 05/19/2019 12:36   CT Cervical Spine Wo Contrast  Result Date: 05/19/2019 CLINICAL DATA:  Head trauma, headache. Neck trauma, uncomplicated. Additional history provided: Reported fall last night hitting head. EXAM: CT HEAD WITHOUT CONTRAST CT CERVICAL SPINE WITHOUT CONTRAST TECHNIQUE: Multidetector CT imaging of the head and cervical spine was performed following the standard protocol without intravenous contrast. Multiplanar CT image reconstructions of the cervical spine were also generated. COMPARISON:  Head CT 04/19/2019, CT cervical spine 07/03/2018. FINDINGS: CT HEAD FINDINGS Brain: There is a small acute/subacute appearing cortical infarct within the mid to anterior left frontal lobe. This was not present on prior head CT 04/19/2019 (series 2, image 19) (series 4, image 16). Scattered ill-defined hypoattenuation within the cerebral white matter is unchanged. Stable, mild generalized parenchymal atrophy. There is no acute intracranial hemorrhage. No extra-axial fluid collection. No evidence of intracranial mass. No midline shift. Vascular: No hyperdense vessel.  Atherosclerotic calcifications. Skull: Normal. Negative for fracture or focal lesion. Sinuses/Orbits: Visualized orbits show no  acute finding. Minimal ethmoid sinus mucosal thickening. Trace fluid within the bilateral mastoid air cells. CT CERVICAL SPINE FINDINGS Alignment: Straightening of the expected cervical lordosis. Mild C3-C4 and C4-C5 grade 1 anterolisthesis. Skull base and vertebrae: The basion-dental and atlanto-dental intervals are maintained.No evidence of acute fracture to the cervical spine. Soft tissues and spinal canal: No prevertebral  fluid or swelling. No visible canal hematoma. Disc levels: Cervical spondylosis with multilevel disc space narrowing, posterior disc osteophytes, uncovertebral and facet hypertrophy. Disc space narrowing is severe at C5-C6, C6-C7, C7-T1, T1-T2 and T2-T3. 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 Multilevel bony spinal canal stenosis, greatest at C5-C6 and C6-C7 (at least moderate in severity at these levels). Bulky multilevel ventrolateral osteophytes. Redemonstrated multilevel degenerative fusion. Upper chest: No consolidation within the imaged lung apices. No visible pneumothorax. Other: Atherosclerotic plaque within the proximal major branch vessels of the neck and bilateral carotid arteries. IMPRESSION: CT head: 1. Small acute/subacute appearing cortically based infarct within the mid to anterior left frontal lobe. This finding was not present on prior head CT 04/19/2019. 2. No acute intracranial hemorrhage 3. Stable nonspecific cerebral white matter disease, most commonly due to small vessel ischemia. 4. Stable mild generalized parenchymal atrophy. 5. Trace bilateral mastoid effusions. CT cervical spine: 1. No evidence of acute fracture to the cervical spine. 2. 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 3. Unchanged C3-C4 and C4-C5 grade 1 anterolisthesis. Electronically Signed   By: Kellie Simmering DO   On: 05/19/2019 12:36   DG Chest Portable 1 View  Result Date: 05/19/2019 CLINICAL DATA:  Fall, altered mental status EXAM: PORTABLE CHEST 1 VIEW COMPARISON:  04/28/2019 FINDINGS: The heart size and mediastinal contours are within normal limits. Interval resolution of previously seen right-sided airspace consolidation. No focal airspace consolidation, pleural effusion, or pneumothorax. Degenerative changes of the shoulders, right worse than left. IMPRESSION: No acute cardiopulmonary findings. Interval resolution of previously seen  right-sided airspace consolidation. Electronically Signed   By: Davina Poke D.O.   On: 05/19/2019 12:04    ____________________________________________   PROCEDURES  Procedure(s) performed (including Critical Care):  Procedures   ____________________________________________   INITIAL IMPRESSION / ASSESSMENT AND PLAN / ED COURSE  Alicia Andrade was evaluated in Emergency Department on 05/19/2019 for the symptoms described in the history of present illness. She was evaluated in the context of the global COVID-19 pandemic, which necessitated consideration that the patient might be at risk for infection with the SARS-CoV-2 virus that causes COVID-19. Institutional protocols and algorithms that pertain to the evaluation of patients at risk for COVID-19 are in a state of rapid change based on information released by regulatory bodies including the CDC and federal and state organizations. These policies and algorithms were followed during the patient's care in the ED.    Patient is a 59 year old who comes in with fall yesterday now concern for altered mental status and possible vomiting.  Will get CT head evaluate for intracranial hemorrhage, CT cervical evaluate for cervical fracture.  No obvious chest wall tenderness but will get chest x-ray to make sure no large rib fractures.  Will get CT abdomen given the vomiting and unable to get a great history to make sure no signs of obstruction, kidney stone, pelvic fracture from the fall.  Will get basic labs to evaluate for Electra abnormalities, AKI, UTI  Patient's labs show hypokalemia patient had a prolonged QTC.  Will give some potassium.  Labs otherwise were pretty reassuring.  CT imaging concerning for discitis versus osteomyelitis of the back.  She has a remote fracture of her right femur which according to family has been seen by orthopedics and determined to be nonoperative.  There is also concern for new stroke on CT head which according to  family they were not aware about and had a CT scan 1 month ago that did not show this.  Given patient's change in mental status in conjunction with her abnormal imaging will discuss with hospital team for admission for further work-up including MRI      ____________________________________________   FINAL CLINICAL IMPRESSION(S) / ED DIAGNOSES   Final diagnoses:  Altered mental status, unspecified altered mental status type  Cerebrovascular accident (CVA), unspecified mechanism (HCC)  Hypokalemia      MEDICATIONS GIVEN DURING THIS VISIT:  Medications  potassium chloride 10 mEq in 100 mL IVPB (10 mEq Intravenous New Bag/Given 05/19/19 1303)  sodium chloride 0.9 % bolus 500 mL (0 mLs Intravenous Stopped 05/19/19 1300)  potassium chloride SA (KLOR-CON) CR tablet 40 mEq (40 mEq Oral Given 05/19/19 1304)     ED Discharge Orders    None       Note:  This document was prepared using Dragon voice recognition software and may include unintentional dictation errors.   Concha Se, MD 05/19/19 1328

## 2019-05-19 NOTE — ED Notes (Signed)
Date and time results received: 05/10/211230  (use smartphrase ".now" to insert current time)  Test: K Critical Value: 2.6  Name of Provider Notified: [facility] Plan  Orders Received? Or Actions Taken?: Orders Received - See Orders for details

## 2019-05-19 NOTE — ED Triage Notes (Signed)
Pt comes via [facility] with c/o AMS per [facility] in [place]. Facility reports pt fell last night and hit her head. Pt was not altered at that time.  Facility states this am the pt was AMS and not eating breakfast. EMS reports pt responded to painful stimuli. CBG-234, BP-114/64  Pt arrives alert and able to follow commands. Pupils reactive.

## 2019-05-19 NOTE — ED Notes (Signed)
Called MRI and they stated it would be another 30-45 minutes for pt's tests to be completed.

## 2019-05-19 NOTE — ED Notes (Signed)
Informed RN Ander Slade that pt is still in MRI and that we would call when pt is on the way up.  Family at bedside and updated at this time

## 2019-05-19 NOTE — ED Notes (Signed)
Pt had saturated brief. Pt cleaned up and clean brief placed at this time.  Pt taken to MRI with K+ still infusing

## 2019-05-20 ENCOUNTER — Inpatient Hospital Stay
Admit: 2019-05-20 | Discharge: 2019-05-20 | Disposition: A | Discharge status: Home / Self Care | Payer: Commercial Managed Care - PPO | Attending: Internal Medicine | Admitting: Internal Medicine

## 2019-05-20 DIAGNOSIS — R4182 Altered mental status, unspecified: Secondary | ICD-10-CM | POA: Diagnosis present

## 2019-05-20 DIAGNOSIS — E1165 Type 2 diabetes mellitus with hyperglycemia: Secondary | ICD-10-CM | POA: Diagnosis present

## 2019-05-20 DIAGNOSIS — R5381 Other malaise: Secondary | ICD-10-CM | POA: Diagnosis present

## 2019-05-20 DIAGNOSIS — E039 Hypothyroidism, unspecified: Secondary | ICD-10-CM | POA: Diagnosis present

## 2019-05-20 DIAGNOSIS — S06339A Contusion and laceration of cerebrum, unspecified, with loss of consciousness of unspecified duration, initial encounter: Secondary | ICD-10-CM | POA: Diagnosis present

## 2019-05-20 DIAGNOSIS — M4646 Discitis, unspecified, lumbar region: Secondary | ICD-10-CM | POA: Diagnosis present

## 2019-05-20 DIAGNOSIS — F319 Bipolar disorder, unspecified: Secondary | ICD-10-CM | POA: Diagnosis present

## 2019-05-20 DIAGNOSIS — E1100 Type 2 diabetes mellitus with hyperosmolarity without nonketotic hyperglycemic-hyperosmolar coma (NKHHC): Secondary | ICD-10-CM | POA: Diagnosis present

## 2019-05-20 DIAGNOSIS — E440 Moderate protein-calorie malnutrition: Secondary | ICD-10-CM | POA: Insufficient documentation

## 2019-05-20 DIAGNOSIS — S0633AA Contusion and laceration of cerebrum, unspecified, with loss of consciousness status unknown, initial encounter: Secondary | ICD-10-CM | POA: Diagnosis present

## 2019-05-20 DIAGNOSIS — K6812 Psoas muscle abscess: Secondary | ICD-10-CM

## 2019-05-20 DIAGNOSIS — R296 Repeated falls: Secondary | ICD-10-CM

## 2019-05-20 DIAGNOSIS — R4189 Other symptoms and signs involving cognitive functions and awareness: Secondary | ICD-10-CM | POA: Diagnosis present

## 2019-05-20 LAB — BASIC METABOLIC PANEL
Anion gap: 10 (ref 5–15)
Anion gap: 9 (ref 5–15)
BUN: 27 mg/dL — ABNORMAL HIGH (ref 6–20)
BUN: 15 mg/dL (ref 6–20)
CO2: 31 mmol/L (ref 22–32)
CO2: 32 mmol/L (ref 22–32)
Calcium: 8.4 mg/dL — ABNORMAL LOW (ref 8.9–10.3)
Calcium: 8.7 mg/dL — ABNORMAL LOW (ref 8.9–10.3)
Chloride: 95 mmol/L — ABNORMAL LOW (ref 98–111)
Chloride: 96 mmol/L — ABNORMAL LOW (ref 98–111)
Creatinine, Ser: 0.43 mg/dL — ABNORMAL LOW (ref 0.44–1.00)
Creatinine, Ser: 0.36 mg/dL — ABNORMAL LOW (ref 0.44–1.00)
GFR calc Af Amer: >60 mL/min (ref >60)
GFR calc Af Amer: >60 mL/min (ref >60)
GFR calc non Af Amer: >60 mL/min (ref >60)
GFR calc non Af Amer: >60 mL/min (ref >60)
Glucose, Bld: 257 mg/dL — ABNORMAL HIGH (ref 70–99)
Glucose, Bld: 174 mg/dL — ABNORMAL HIGH (ref 70–99)
Potassium: 2.9 mmol/L — ABNORMAL LOW (ref 3.5–5.1)
Potassium: 3.6 mmol/L (ref 3.5–5.1)
Sodium: 136 mmol/L (ref 135–145)
Sodium: 137 mmol/L (ref 135–145)

## 2019-05-20 LAB — URINE DRUG SCREEN, QUALITATIVE (ARMC ONLY)
Amphetamines, Ur Screen: NOT DETECTED
Barbiturates, Ur Screen: NOT DETECTED
Benzodiazepine, Ur Scrn: NOT DETECTED
Cannabinoid 50 Ng, Ur ~~LOC~~: NOT DETECTED
Cocaine Metabolite,Ur ~~LOC~~: NOT DETECTED
MDMA (Ecstasy)Ur Screen: NOT DETECTED
Methadone Scn, Ur: NOT DETECTED
Opiate, Ur Screen: NOT DETECTED
Phencyclidine (PCP) Ur S: NOT DETECTED
Tricyclic, Ur Screen: NOT DETECTED

## 2019-05-20 LAB — URINALYSIS, COMPLETE (UACMP) WITH MICROSCOPIC
Bilirubin Urine: NEGATIVE
Glucose, UA: NEGATIVE mg/dL
Ketones, ur: NEGATIVE mg/dL
Nitrite: NEGATIVE
Protein, ur: NEGATIVE mg/dL
RBC / HPF: >50 RBC/hpf — ABNORMAL HIGH (ref 0–5)
Specific Gravity, Urine: 1.004 — ABNORMAL LOW (ref 1.005–1.030)
WBC, UA: >50 WBC/hpf — ABNORMAL HIGH (ref 0–5)
pH: 5 (ref 5.0–8.0)

## 2019-05-20 LAB — CBC WITH DIFFERENTIAL/PLATELET
Abs Immature Granulocytes: 0.06 10*3/uL (ref 0.00–0.07)
Basophils Absolute: 0 10*3/uL (ref 0.0–0.1)
Basophils Relative: 0 %
Eosinophils Absolute: 0 10*3/uL (ref 0.0–0.5)
Eosinophils Relative: 0 %
HCT: 28.9 % — ABNORMAL LOW (ref 36.0–46.0)
Hemoglobin: 9.9 g/dL — ABNORMAL LOW (ref 12.0–15.0)
Immature Granulocytes: 0 %
Lymphocytes Relative: 9 %
Lymphs Abs: 1.2 10*3/uL (ref 0.7–4.0)
MCH: 29.5 pg (ref 26.0–34.0)
MCHC: 34.3 g/dL (ref 30.0–36.0)
MCV: 86 fL (ref 80.0–100.0)
Monocytes Absolute: 0.7 10*3/uL (ref 0.1–1.0)
Monocytes Relative: 5 %
Neutro Abs: 12 10*3/uL — ABNORMAL HIGH (ref 1.7–7.7)
Neutrophils Relative %: 86 %
Platelets: 327 10*3/uL (ref 150–400)
RBC: 3.36 MIL/uL — ABNORMAL LOW (ref 3.87–5.11)
RDW: 14.3 % (ref 11.5–15.5)
WBC: 14.1 10*3/uL — ABNORMAL HIGH (ref 4.0–10.5)
nRBC: 0 % (ref 0.0–0.2)

## 2019-05-20 LAB — HEMOGLOBIN A1C
Hgb A1c MFr Bld: 8.8 % — ABNORMAL HIGH (ref 4.8–5.6)
Mean Plasma Glucose: 205.86 mg/dL

## 2019-05-20 LAB — GLUCOSE, CAPILLARY
Glucose-Capillary: 138 mg/dL — ABNORMAL HIGH (ref 70–99)
Glucose-Capillary: 163 mg/dL — ABNORMAL HIGH (ref 70–99)
Glucose-Capillary: 187 mg/dL — ABNORMAL HIGH (ref 70–99)
Glucose-Capillary: 223 mg/dL — ABNORMAL HIGH (ref 70–99)
Glucose-Capillary: 281 mg/dL — ABNORMAL HIGH (ref 70–99)

## 2019-05-20 LAB — LIPID PANEL
Cholesterol: 174 mg/dL (ref 0–200)
HDL: 37 mg/dL — ABNORMAL LOW (ref >40)
LDL Cholesterol: 109 mg/dL — ABNORMAL HIGH (ref 0–99)
Total CHOL/HDL Ratio: 4.7 RATIO
Triglycerides: 140 mg/dL (ref <150)
VLDL: 28 mg/dL (ref 0–40)

## 2019-05-20 LAB — VITAMIN B12: Vitamin B-12: 435 pg/mL (ref 180–914)

## 2019-05-20 LAB — ECHOCARDIOGRAM COMPLETE
Height: 62 in
Weight: 2028.23 oz

## 2019-05-20 LAB — FOLATE: Folate: 8.8 ng/mL (ref >5.9)

## 2019-05-20 LAB — MAGNESIUM: Magnesium: 1.4 mg/dL — ABNORMAL LOW (ref 1.7–2.4)

## 2019-05-20 LAB — MRSA PCR SCREENING: MRSA by PCR: NEGATIVE

## 2019-05-20 LAB — VITAMIN D 25 HYDROXY (VIT D DEFICIENCY, FRACTURES): Vit D, 25-Hydroxy: 13.23 ng/mL — ABNORMAL LOW (ref 30–100)

## 2019-05-20 MED ORDER — MAGNESIUM SULFATE 4 GM/100ML IV SOLN
4.0000 g | Freq: Once | INTRAVENOUS | Status: AC
  Administered 2019-05-20: 4 g via INTRAVENOUS
  Filled 2019-05-20: qty 100

## 2019-05-20 MED ORDER — POTASSIUM CHLORIDE 10 MEQ/100ML IV SOLN
10.0000 meq | INTRAVENOUS | Status: AC
  Administered 2019-05-20 (×4): 10 meq via INTRAVENOUS
  Filled 2019-05-20 (×2): qty 100

## 2019-05-20 MED ORDER — ENSURE MAX PROTEIN PO LIQD
11.0000 [oz_av] | Freq: Two times a day (BID) | ORAL | Status: DC
  Administered 2019-05-21 – 2019-05-28 (×15): 11 [oz_av] via ORAL
  Filled 2019-05-20: qty 330

## 2019-05-20 MED ORDER — POTASSIUM CHLORIDE CRYS ER 20 MEQ PO TBCR: 40.0000 meq | EXTENDED_RELEASE_TABLET | Freq: Once | ORAL | Status: DC

## 2019-05-20 MED ORDER — ADULT MULTIVITAMIN W/MINERALS CH
1.0000 | ORAL_TABLET | Freq: Every day | ORAL | Status: DC
  Administered 2019-05-21 – 2019-05-28 (×8): 1 via ORAL
  Filled 2019-05-20 (×8): qty 1

## 2019-05-20 MED ORDER — ASCORBIC ACID 500 MG PO TABS
500.0000 mg | ORAL_TABLET | Freq: Two times a day (BID) | ORAL | Status: DC
  Administered 2019-05-20 – 2019-05-28 (×16): 500 mg via ORAL
  Filled 2019-05-20 (×16): qty 1

## 2019-05-20 MED ORDER — LAMOTRIGINE ER 200 MG PO TB24: 1.0000 | ORAL_TABLET | Freq: Every day | ORAL | Status: DC

## 2019-05-20 MED ORDER — GABAPENTIN 300 MG PO CAPS
300.0000 mg | ORAL_CAPSULE | Freq: Three times a day (TID) | ORAL | Status: DC
  Administered 2019-05-20 – 2019-05-28 (×24): 300 mg via ORAL
  Filled 2019-05-20 (×24): qty 1

## 2019-05-20 MED ORDER — LAMOTRIGINE 100 MG PO TABS
100.0000 mg | ORAL_TABLET | Freq: Two times a day (BID) | ORAL | Status: DC
  Administered 2019-05-20 – 2019-05-28 (×17): 100 mg via ORAL
  Filled 2019-05-20 (×17): qty 1

## 2019-05-20 MED ORDER — VENLAFAXINE HCL ER 75 MG PO CP24
150.0000 mg | ORAL_CAPSULE | Freq: Every day | ORAL | Status: DC
  Administered 2019-05-21 – 2019-05-28 (×8): 150 mg via ORAL
  Filled 2019-05-20 (×8): qty 2

## 2019-05-20 MED ORDER — COLLAGENASE 250 UNIT/GM EX OINT
TOPICAL_OINTMENT | Freq: Every day | CUTANEOUS | Status: DC
  Administered 2019-05-24: 2 via TOPICAL
  Filled 2019-05-20: qty 30

## 2019-05-20 MED ORDER — LORAZEPAM 2 MG/ML IJ SOLN
0.5000 mg | INTRAMUSCULAR | Status: DC | PRN
  Administered 2019-05-22: 0.5 mg via INTRAVENOUS
  Filled 2019-05-20: qty 1

## 2019-05-20 MED ORDER — LEVOTHYROXINE SODIUM 25 MCG PO TABS
125.0000 ug | ORAL_TABLET | Freq: Every day | ORAL | Status: DC
  Administered 2019-05-21 – 2019-05-28 (×8): 125 ug via ORAL
  Filled 2019-05-20 (×8): qty 1

## 2019-05-20 MED ORDER — INSULIN GLARGINE 100 UNIT/ML ~~LOC~~ SOLN
15.0000 [IU] | Freq: Every day | SUBCUTANEOUS | Status: DC
  Administered 2019-05-20 – 2019-05-28 (×9): 15 [IU] via SUBCUTANEOUS
  Filled 2019-05-20 (×11): qty 0.15

## 2019-05-20 NOTE — TOC Initial Note (Addendum)
Transition of Care [facility]) - Initial/Assessment Note    Patient Details  Name: Alicia Andrade MRN: [id number] Date of Birth: 1960/02/09  [facility] CM/SW Contact:    Lucy Chris, LCSW Phone Number: 05/20/2019, 1:37 PM  Clinical Narrative:   Spoke with sister-Alicia Andrade via telephone to discuss discharge plans. She reports pt has been at [facility] since 4/29. She feels they have not been providing the care pt requires and wants to pursue her going to a place closer to her in [place] so she can manage her care and be there for her. Discussed could be in her co-pay days but pt has private funds also. She has no family up here and sister who is her POA needs her closer. Will await PT eval pt may benefit from CIR due to young age and medical issues needing to be monitored. Sister seems very sincere and genuine concerned about sister and her care. She will get facilities close to her this worker can pursue.             2:00 pm According to SPT SNF most appropriate place for pt due to will need longer than CIR.  Expected Discharge Plan: Skilled Nursing Facility Barriers to Discharge: Continued Medical Work up   Patient Goals and CMS Choice Patient states their goals for this hospitalization and ongoing recovery are:: Sister wants closer to her in [place], can manage and make sure cared for      Expected Discharge Plan and Services Expected Discharge Plan: Skilled Nursing Facility In-house Referral: Clinical Social Work     Living arrangements for the past 2 months: Single Family Home                                      Prior Living Arrangements/Services Living arrangements for the past 2 months: Single Family Home Lives with:: Self, Facility Resident(recently at [facility] [place] 4/29-5/10) Patient language and need for interpreter reviewed:: No Do you feel safe going back to the place where you live?: No   Doesn't want to go back to  [facility]  Need for Family Participation in Patient Care: No (Comment) Care giver support system in place?: No (comment)   Criminal Activity/Legal Involvement Pertinent to Current Situation/Hospitalization: No - Comment as needed  Activities of Daily Living Home Assistive Devices/Equipment: Wheelchair ADL Screening (condition at time of admission) Patient's cognitive ability adequate to safely complete daily activities?: No Is the patient deaf or have difficulty hearing?: Yes Does the patient have difficulty seeing, even when wearing glasses/contacts?: No Does the patient have difficulty concentrating, remembering, or making decisions?: Yes Patient able to express need for assistance with ADLs?: No Does the patient have difficulty dressing or bathing?: Yes Independently performs ADLs?: No Communication: Dependent Is this a change from baseline?: Pre-admission baseline Dressing (OT): Dependent Is this a change from baseline?: Pre-admission baseline Grooming: Dependent Is this a change from baseline?: Pre-admission baseline Feeding: Dependent Is this a change from baseline?: Pre-admission baseline Bathing: Dependent Is this a change from baseline?: Pre-admission baseline Toileting: Dependent Is this a change from baseline?: Pre-admission baseline In/Out Bed: Dependent Is this a change from baseline?: Pre-admission baseline Walks in Home: Dependent Is this a change from baseline?: Pre-admission baseline Does the patient have difficulty walking or climbing stairs?: Yes Weakness of Legs: Both Weakness of Arms/Hands: Both  Permission Sought/Granted Permission sought to share information with :  Other (comment)(sister-POA) Permission granted to share information with : Yes, Verbal Permission Granted  Share Information with NAME: Benjamine Mola     Permission granted to share info w Relationship: sister-POA     Emotional Assessment Appearance:: Appears stated  age Attitude/Demeanor/Rapport: Unable to Assess(exhausted from tests today) Affect (typically observed): Accepting, Calm Orientation: : Oriented to Self, Oriented to Place, Oriented to Situation Alcohol / Substance Use: Never Used    Admission diagnosis:  Cough [R05] Hypokalemia [E87.6] Stroke ([place]) [I63.9] Altered mental status, unspecified altered mental status type [R41.82] Cerebrovascular accident (CVA), unspecified mechanism ([place]) [I63.9] Patient Active Problem List   Diagnosis Date Noted  . Stroke ([place]) 05/19/2019  . Pressure injury of skin 05/19/2019   PCP:  Bonnita Nasuti, MD Pharmacy:  No Pharmacies Listed    Social Determinants of Health (SDOH) Interventions    Readmission Risk Interventions No flowsheet data found.

## 2019-05-20 NOTE — Evaluation (Signed)
Speech Language Pathology Evaluation Patient Details Name: Alicia Andrade MRN: [id number] DOB: 22-Nov-1960 Today's Date: 05/20/2019 Time: 1115-1206 SLP Time Calculation (min) (ACUTE ONLY): 51 min  Problem List:  Patient Active Problem List   Diagnosis Date Noted  . Altered mental status 05/20/2019  . Cognitive decline 05/20/2019  . DM hyperosmolarity type II, uncontrolled ([place]) 05/20/2019  . Hypothyroidism 05/20/2019  . Bipolar disorder ([place]) 05/20/2019  . 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 Cerebral contusion ([place]) 05/20/2019  . Debility 05/20/2019  . Frequent falls 05/20/2019  . Malnutrition of moderate degree 05/20/2019  . Stroke ([place]) 05/19/2019  . Pressure injury of skin 05/19/2019   Past Medical History:  Past Medical History:  Diagnosis Date  . Bipolar 1 disorder ([place])   . Diabetes mellitus without complication ([place])   . Hypokalemia   . Hypothyroidism   . Major depressive disorder   . Rhabdomyolysis   . Thrombocytopenia ([place])    Past Surgical History: History reviewed. No pertinent surgical history. HPI:  Alicia Andrade is an 59 y.o. female who has a history of stroke (affecting speech), BPD, DM, hypothyroidism and thrombocytopenia who had a fall in April.  Pt was hospitalized at [facility] for her fall and she was found to have blood sugars over 600.  During this hospitalization pt was diagnosed with right hip fracture. This hospitalization was prolonged but patient was discharged to rehab on at the end of April.  About 5 days prior to discharge from [facility], sister noted that the patient was confused and was told it was hospital delirium. Pt discharged to [facility] for rehab and on 05/18/2019 pt sustained trauma to the back of her head from a fall. She presented to [facility] on 05/29/2019 with complaints of altered mental status, vomiting, and falls. 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 She had been having some difficulty with learning new techniques and processes at work according to her sister. Over the past 2 years the patient had lost 120 lbs. During this time she had stopped taking insulin as she felt that she no longer needed it. It appears from pharmacy's medication reconciliation that her glucoses were neither followed or treated at [facility]. MRI brain demonstrated no infarct as was suggested by CT, but a cerebral contusion instead.   Assessment / Plan / Recommendation Clinical Impression  Pt presents with moderate impairments in cognitive communication. At this time, it is impossible to differentially diagnose aphasia from potential cognitivie deficits. Pt presents with perseveration of thoughts and thought content indicated level fo confusion (pt kept saying that she had fallen out of bed and that she was laying on the floor when in actuality pt was laying in the bed). With maximal mutlimodal cues, pt able to verbalize that she was in the bed but this took high level of cues and high level of effort on pt's part. While she was not able to state that she was laying in her bed, she was able to recall that she was to NPO at midnight. Pt was oriented to herself only. Pt was intermittently aware that she was producing inappropriate words but she was not able to correct them. She was also very anxious and limited by pain. Pt's sister present during later part of evaluation.     SLP Assessment  SLP Recommendation/Assessment: Patient needs continued Speech Lanaguage Pathology Services SLP Visit Diagnosis: Cognitive communication deficit (  R41.841)    Follow Up Recommendations  Skilled Nursing facility    Frequency and Duration min 2x/week  2 weeks      SLP Evaluation Cognition  Overall Cognitive Status: Impaired/Different from baseline Arousal/Alertness: Awake/alert Orientation Level: Oriented to person;Disoriented to time;Disoriented to  situation;Disoriented to place Attention: Sustained Sustained Attention: Appears intact Awareness: Impaired Awareness Impairment: Emergent impairment Problem Solving: Impaired Problem Solving Impairment: Verbal basic;Functional basic Executive Function: (all impaired d/t lower level deficits) Behaviors: Restless;Poor frustration tolerance;Perseveration(anxious) Safety/Judgment: Impaired       Comprehension  Auditory Comprehension Overall Auditory Comprehension: (difficult to differentiate) Visual Recognition/Discrimination Discrimination: Not tested Reading Comprehension Reading Status: Not tested    Expression Expression Primary Mode of Expression: Verbal Verbal Expression Overall Verbal Expression: Impaired Written Expression Dominant Hand: Right Written Expression: Not tested   Oral / Motor  Oral Motor/Sensory Function Overall Oral Motor/Sensory Function: Within functional limits Motor Speech Overall Motor Speech: Appears within functional limits for tasks assessed   GO            Alicia Andrade B. Dreama Saa M.S., CCC-SLP, [facility] Speech-Language Pathologist Rehabilitation Services Office [phone]         Alicia Andrade 05/20/2019, 4:15 PM

## 2019-05-20 NOTE — Progress Notes (Addendum)
Inpatient Diabetes Program Recommendations  AACE/ADA: New Consensus Statement on Inpatient Glycemic Control (2015)  Target Ranges:  Prepandial:   less than 140 mg/dL      Peak postprandial:   less than 180 mg/dL (1-2 hours)      Critically ill patients:  140 - 180 mg/dL   Results for SUZETTA, TIMKO (MRN [id number]) as of 05/20/2019 12:08  Ref. Range 05/20/2019 00:23 05/20/2019 07:53 05/20/2019 11:30  Glucose-Capillary Latest Ref Range: 70 - 99 mg/dL 622 (H) 297 (H)  5 units NOVOLOG  187 (H)   Results for YOMAYRA, TATE (MRN [id number]) as of 05/20/2019 12:08  Ref. 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 +DM, Bipolar disorder    Home DM Meds: Unknown (need to clarify)   Current Orders: Lantus 15 units Daily       Novolog Moderate Correction Scale/ SSI (0-15 units) TID AC + HS         Getting Glucerna PO supps    MD- Note Lantus and Novolog both started this AM.  Unsure what diabetes meds patient was taking in the past?  Have placed call to pt's sister to please call the Diabetes Team back to see if she knows what diabetes meds pt was taking in the past.  Based on current A1c of 8.8%, patient likely needs at least an oral diabetes medication at time of discharge     Addendum 3pm--Pt's sister Teresha Hanks called me back today.  Per Sister, pt was  getting Insulin during her hospitalization at [facility] back in April.  Not sure what happened, but per sister, pt was discharged to the [facility] and NO diabetes meds were continued there.  Sister told me that she thinks pt was taking Lantus insulin when she was taking diabetes meds at home, however per sister, pt thinks she stopped taking all diabetes meds back in November 2019 due to her (the pt) thinking she didn't need diabetes meds anymore since she lost so much weight.  Reviewed with pt's sister that we are checking pt's CBGs TID AC + HS and giving her both Lantus and Novolog insulins.  Explained what each of these insulins are, how they work, how/when we give, etc.  Discussed w/ pt's sister that we will monitor pt's CBGs closely and adjust insulin as needed based on CBG results.  Discussed with sister that the Diabetes team reviews all abnormal CBGs and works in conjunction with both the RNs and MDs to make sure the patients have good CBG control in the hospital.  Pt's sister appreciative of all the info.    --Will follow patient during hospitalization--  Ambrose Finland RN, MSN, CDE Diabetes Coordinator Inpatient Glycemic Control Team Team Pager: [phone] (8a-5p)

## 2019-05-20 NOTE — Progress Notes (Signed)
*  PRELIMINARY RESULTS* Echocardiogram 2D Echocardiogram has been performed.  Alicia Andrade 05/20/2019, 10:02 AM

## 2019-05-20 NOTE — Consult Note (Addendum)
Chief Complaint: Patient was seen in consultation today for CT-guided aspiration of paraspinous fluid collection/? abscess Chief Complaint  Patient presents with  . Altered Mental Status    Referring Physician(s): Swayze,A  Supervising Physician: Ruel Favors  Patient Status: [facility] - In-pt  History of Present Illness: Alicia Andrade is a 59 y.o. female with past medical history of bipolar disorder, diabetes, hypothyroidism who was admitted to [facility] on 5/10 with mental status changes, weight loss, vomiting, back pain and fall with trauma to the back of her head.  In the ED patient was noted to be hypokalemic, hypomagnesemic, and with leukocytosis.  She has a stage II sacral decubitus ulcer as well as recent UTI.  MRI brain showed no acute or subacute infarction but small hemorrhagic contusion.  There were chronic small vessel ischemic changes.  CT scan revealed unhealed comminuted fracture involving the greater tuberosity of the proximal right femur and nonobstructive left renal calculus as well as fat-containing periumbilical hernia. 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  She is COVID-19 negative.  Blood cultures are pending.  WBC 14.1, hemoglobin 9.9, platelets 327k, PT/INR normal, creat 0.43, K 2.9.  Request now received from primary care team for image guided paraspinous fluid collection/abscess aspiration.  Past Medical History:  Diagnosis Date  . Bipolar 1 disorder (HCC)   . Diabetes mellitus without complication (HCC)   . Hypokalemia   . Hypothyroidism   . Major depressive disorder   . Rhabdomyolysis   . Thrombocytopenia (HCC)     History reviewed. No pertinent surgical history.  Allergies: Patient has no allergy information on record.  Medications: Prior to Admission medications   Medication Sig Start Date End Date Taking? Authorizing  Provider  gabapentin (NEURONTIN) 300 MG capsule Take 300 mg by mouth 3 (three) times daily. 03/10/19  Yes [provider]  LamoTRIgine 200 MG TB24 24 hour tablet Take 1 tablet by mouth daily. 03/13/19  Yes [provider]  levothyroxine (SYNTHROID) 125 MCG tablet Take 125 mcg by mouth daily. 03/13/19  Yes [provider]  olmesartan-hydrochlorothiazide (BENICAR HCT) 20-12.5 MG tablet Take 1 tablet by mouth daily. 03/11/19  Yes [provider]  venlafaxine XR (EFFEXOR-XR) 150 MG 24 hr capsule Take 150 mg by mouth daily. 03/13/19  Yes [provider]     History reviewed. No pertinent family history.  Social History   Socioeconomic History  . Marital status: Single    Spouse name: Not on file  . Number of children: Not on file  . Years of education: Not on file  . Highest education level: Not on file  Occupational History  . Not on file  Tobacco Use  . Smoking status: Never Smoker  . Smokeless tobacco: Never Used  Substance and Sexual Activity  . Alcohol use: Not on file  . Drug use: Not on file  . Sexual activity: Not on file  Other Topics Concern  . Not on file  Social History Narrative  . Not on file   Social Determinants of Health   Financial Resource Strain:   . Difficulty of Paying Living Expenses:   Food Insecurity:   . Worried About Programme researcher, broadcasting/film/video in the Last Year:   . Barista in the Last Year:   Transportation Needs:   . Freight forwarder (Medical):   Marland Kitchen Lack of Transportation (Non-Medical):   Physical  Activity:   . Days of Exercise per Week:   . Minutes of Exercise per Session:   Stress:   . Feeling of Stress :   Social Connections:   . Frequency of Communication with Friends and Family:   . Frequency of Social Gatherings with Friends and Family:   . Attends Religious Services:   . Active Member of Clubs or Organizations:   . Attends Banker Meetings:   Marland Kitchen Marital Status:       Review of  Systems see above: Currently denies fever, headache, chest pain, dyspnea, cough, abdominal pain, nausea, vomiting or bleeding.  She does have back pain.  Vital Signs: BP (!) 153/78 (BP Location: Right Arm)   Pulse 95   Temp 98.8 F (37.1 C) (Oral)   Resp 18   Ht  (1.575 m)   Wt 126 lb 12.2 oz (57.5 kg)   SpO2 99%   BMI 23.19 kg/m   Physical Exam patient awake, answers questions appropriately.  Chest clear to auscultation bilaterally.  Heart with regular rate and rhythm.  Abdomen soft, positive bowel sounds, nontender.  No lower extremity edema.  Imaging: CT ABDOMEN PELVIS WO CONTRAST  Result Date: 05/19/2019 CLINICAL DATA:  Status post fall. EXAM: CT ABDOMEN AND PELVIS WITHOUT CONTRAST TECHNIQUE: Multidetector CT imaging of the abdomen and pelvis was performed following the standard protocol without IV contrast. COMPARISON:  April 21, 2019. FINDINGS: Lower chest: No acute abnormality. Hepatobiliary: Solitary gallstone is noted. No biliary dilatation is noted. However left hepatic pneumobilia is noted. Pancreas: Unremarkable. No pancreatic ductal dilatation or surrounding inflammatory changes. Spleen: Normal in size without focal abnormality. Adrenals/Urinary Tract: Adrenal glands appear normal. Small nonobstructive left renal calculus is noted. No hydronephrosis or renal obstruction is noted. Mild urinary bladder distention is noted. Stomach/Bowel: Stomach is within normal limits. Appendix appears normal. No evidence of bowel wall thickening, distention, or inflammatory changes. Vascular/Lymphatic: Aortic atherosclerosis. No enlarged abdominal or pelvic lymph nodes. Reproductive: Uterus and bilateral adnexa are unremarkable. Other: Moderate size fat containing periumbilical hernia is noted in the pelvis. No ascites is noted. Musculoskeletal: Un healed comminuted fracture is seen involving the greater tuberosity of the proximal right femur. Multilevel degenerative disc disease is noted in the  lumbar spine. There appears to be lytic destruction involving the adjacent articular surfaces of L3-4 disc space concerning for discitis and osteomyelitis. MRI is recommended for further evaluation. IMPRESSION: 1. Unhealed comminuted fracture is seen involving the greater tuberosity of the proximal right femur. 2. There appears to be lytic destruction involving the adjacent vertebral endplates of L3-4 disc space concerning for discitis and osteomyelitis. MRI is recommended for further evaluation. 3. Solitary gallstone is noted. Left hepatic pneumobilia is noted which may be due to prior sphincterotomy. 4. Small nonobstructive left renal calculus. 5. Moderate size fat containing periumbilical hernia. Aortic Atherosclerosis (ICD10-I70.0). Electronically Signed   By: Lupita Raider M.D.   On: 05/19/2019 12:27   CT Head Wo Contrast  Result Date: 05/19/2019 CLINICAL DATA:  Head trauma, headache. Neck trauma, uncomplicated. Additional history provided: Reported fall last night hitting head. EXAM: CT HEAD WITHOUT CONTRAST CT CERVICAL SPINE WITHOUT CONTRAST TECHNIQUE: Multidetector CT imaging of the head and cervical spine was performed following the standard protocol without intravenous contrast. Multiplanar CT image reconstructions of the cervical spine were also generated. COMPARISON:  Head CT 04/19/2019, CT cervical spine 07/03/2018. FINDINGS: CT HEAD FINDINGS Brain: There is a small acute/subacute appearing cortical infarct within the mid to anterior  left frontal lobe. This was not present on prior head CT 04/19/2019 (series 2, image 19) (series 4, image 16). Scattered ill-defined hypoattenuation within the cerebral white matter is unchanged. Stable, mild generalized parenchymal atrophy. There is no acute intracranial hemorrhage. No extra-axial fluid collection. No evidence of intracranial mass. No midline shift. Vascular: No hyperdense vessel.  Atherosclerotic calcifications. Skull: Normal. Negative for fracture  or focal lesion. Sinuses/Orbits: Visualized orbits show no acute finding. Minimal ethmoid sinus mucosal thickening. Trace fluid within the bilateral mastoid air cells. CT CERVICAL SPINE FINDINGS Alignment: Straightening of the expected cervical lordosis. Mild C3-C4 and C4-C5 grade 1 anterolisthesis. Skull base and vertebrae: The basion-dental and atlanto-dental intervals are maintained.No evidence of acute fracture to the cervical spine. Soft tissues and spinal canal: No prevertebral fluid or swelling. No visible canal hematoma. Disc levels: Cervical spondylosis with multilevel disc space narrowing, posterior disc osteophytes, uncovertebral and facet hypertrophy. Disc space narrowing is severe at C5-C6, C6-C7, C7-T1, T1-T2 and T2-T3. 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 Multilevel bony spinal canal stenosis, greatest at C5-C6 and C6-C7 (at least moderate in severity at these levels). Bulky multilevel ventrolateral osteophytes. Redemonstrated multilevel degenerative fusion. Upper chest: No consolidation within the imaged lung apices. No visible pneumothorax. Other: Atherosclerotic plaque within the proximal major branch vessels of the neck and bilateral carotid arteries. IMPRESSION: CT head: 1. Small acute/subacute appearing cortically based infarct within the mid to anterior left frontal lobe. This finding was not present on prior head CT 04/19/2019. 2. No acute intracranial hemorrhage 3. Stable nonspecific cerebral white matter disease, most commonly due to small vessel ischemia. 4. Stable mild generalized parenchymal atrophy. 5. Trace bilateral mastoid effusions. CT cervical spine: 1. No evidence of acute fracture to the cervical spine. 2. 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 3. Unchanged C3-C4 and C4-C5 grade 1 anterolisthesis. Electronically Signed   By: Jackey Loge DO   On: 05/19/2019 12:36   CT Cervical Spine Wo  Contrast  Result Date: 05/19/2019 CLINICAL DATA:  Head trauma, headache. Neck trauma, uncomplicated. Additional history provided: Reported fall last night hitting head. EXAM: CT HEAD WITHOUT CONTRAST CT CERVICAL SPINE WITHOUT CONTRAST TECHNIQUE: Multidetector CT imaging of the head and cervical spine was performed following the standard protocol without intravenous contrast. Multiplanar CT image reconstructions of the cervical spine were also generated. COMPARISON:  Head CT 04/19/2019, CT cervical spine 07/03/2018. FINDINGS: CT HEAD FINDINGS Brain: There is a small acute/subacute appearing cortical infarct within the mid to anterior left frontal lobe. This was not present on prior head CT 04/19/2019 (series 2, image 19) (series 4, image 16). Scattered ill-defined hypoattenuation within the cerebral white matter is unchanged. Stable, mild generalized parenchymal atrophy. There is no acute intracranial hemorrhage. No extra-axial fluid collection. No evidence of intracranial mass. No midline shift. Vascular: No hyperdense vessel.  Atherosclerotic calcifications. Skull: Normal. Negative for fracture or focal lesion. Sinuses/Orbits: Visualized orbits show no acute finding. Minimal ethmoid sinus mucosal thickening. Trace fluid within the bilateral mastoid air cells. CT CERVICAL SPINE FINDINGS Alignment: Straightening of the expected cervical lordosis. Mild C3-C4 and C4-C5 grade 1 anterolisthesis. Skull base and vertebrae: The basion-dental and atlanto-dental intervals are maintained.No evidence of acute fracture to the cervical spine. Soft tissues and spinal canal: No prevertebral fluid or swelling. No visible canal hematoma. Disc levels: Cervical spondylosis with multilevel disc space narrowing, posterior disc osteophytes, uncovertebral and facet hypertrophy. Disc space narrowing is severe at C5-C6, C6-C7, C7-T1, T1-T2 and  T2-T3. 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 Multilevel bony  spinal canal stenosis, greatest at C5-C6 and C6-C7 (at least moderate in severity at these levels). Bulky multilevel ventrolateral osteophytes. Redemonstrated multilevel degenerative fusion. Upper chest: No consolidation within the imaged lung apices. No visible pneumothorax. Other: Atherosclerotic plaque within the proximal major branch vessels of the neck and bilateral carotid arteries. IMPRESSION: CT head: 1. Small acute/subacute appearing cortically based infarct within the mid to anterior left frontal lobe. This finding was not present on prior head CT 04/19/2019. 2. No acute intracranial hemorrhage 3. Stable nonspecific cerebral white matter disease, most commonly due to small vessel ischemia. 4. Stable mild generalized parenchymal atrophy. 5. Trace bilateral mastoid effusions. CT cervical spine: 1. No evidence of acute fracture to the cervical spine. 2. 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 3. Unchanged C3-C4 and C4-C5 grade 1 anterolisthesis. Electronically Signed   By: Kellie Simmering DO   On: 05/19/2019 12:36   MR ANGIO NECK W WO CONTRAST  Result Date: 05/19/2019 CLINICAL DATA:  Fall with altered mental status. EXAM: MRA NECK WITHOUT AND WITH CONTRAST TECHNIQUE: Multiplanar and multiecho pulse sequences of the neck were obtained without and with intravenous contrast. Angiographic images of the neck were obtained using MRA technique without and with intravenous contrast. CONTRAST:  3mL GADAVIST GADOBUTROL 1 MMOL/ML IV SOLN COMPARISON:  None. FINDINGS: Branching pattern is normal without origin stenosis. Both common carotid arteries widely patent to the bifurcation. Both carotid bifurcations widely patent. Cervical internal carotid arteries widely patent. Both vertebral artery origins widely patent. Both vertebral arteries are widely patent through the cervical region to the foramen magnum. Circle Willis vessels included on this scan. Low resolution  evaluation shows patency of the major vessels without evidence of aneurysm or correctable proximal stenosis. IMPRESSION: Negative MR angiography of the neck vessels. No stenosis or dissection. Electronically Signed   By: Nelson Chimes M.D.   On: 05/19/2019 16:53   MR BRAIN WO CONTRAST  Result Date: 05/19/2019 CLINICAL DATA:  Fall with altered mental status. Left frontal infarction seen by CT. EXAM: MRI HEAD WITHOUT CONTRAST TECHNIQUE: Multiplanar, multiecho pulse sequences of the brain and surrounding structures were obtained without intravenous contrast. COMPARISON:  Head CT earlier same day. FINDINGS: Brain: Diffusion imaging does not show any acute or subacute infarction. The left frontal abnormality described by CT does not show restricted diffusion but shows an area of edema with small areas of internal microhemorrhage, most consistent with a small hemorrhagic contusion. The differential diagnosis does include late subacute infarction, but I favor contusion. Elsewhere, the brainstem shows chronic small-vessel ischemic changes. No focal cerebellar insult. Mild chronic small-vessel ischemic change elsewhere affects the cerebral hemispheric white matter. No hydrocephalus. No extra-axial collection Vascular: Major vessels at the base of the brain show flow. Skull and upper cervical spine: Negative Sinuses/Orbits: Clear/normal Other: None IMPRESSION: No acute or subacute infarction. The left frontal abnormality shown by CT is most consistent with a small hemorrhagic contusion. Elsewhere, the brain shows chronic small-vessel ischemic changes. Electronically Signed   By: Nelson Chimes M.D.   On: 05/19/2019 16:51   MR THORACIC SPINE WO CONTRAST  Result Date: 05/19/2019 CLINICAL DATA:  Mental status changes.  Fall. EXAM: MRI THORACIC SPINE WITHOUT CONTRAST TECHNIQUE: Multiplanar, multisequence MR imaging of the thoracic spine was performed. No intravenous contrast was administered. COMPARISON:  None. FINDINGS:  Alignment:  No traumatic malalignment. Vertebrae: No thoracic region fracture. Cord:  No cord  compression or primary cord lesion. Paraspinal and other soft tissues: Negative Disc levels: Detail is limited because of motion. Degenerative spondylosis and facet arthritis in the thoracic region as often seen at this age. No evidence of compressive canal stenosis. Foraminal stenosis on the right at T4-5 due to osteophytic encroachment could possibly affect the right C5 nerve. The other foramina appear sufficiently patent. IMPRESSION: Motion degraded study. No evidence of acute thoracic region fracture or cord injury. Degenerative spondylosis and facet arthropathy, with limited evaluation due to motion. No apparent compressive canal stenosis. Right foraminal narrowing at T4-5 because of osteophytic encroachment. Electronically Signed   By: Paulina Fusi M.D.   On: 05/19/2019 16:59   MR CERVICAL SPINE W WO CONTRAST  Result Date: 05/19/2019 CLINICAL DATA:  Fall with trauma to the head and neck. EXAM: MRI CERVICAL SPINE WITHOUT AND WITH CONTRAST TECHNIQUE: Multiplanar and multiecho pulse sequences of the cervical spine, to include the craniocervical junction and cervicothoracic junction, were obtained without and with intravenous contrast. CONTRAST:  75mL GADAVIST GADOBUTROL 1 MMOL/ML IV SOLN COMPARISON:  CT earlier same day. FINDINGS: The study suffers from considerable motion degradation. Alignment: Straightening of the normal cervical lordosis. No traumatic malalignment. Vertebrae: No evidence of any fracture or bone marrow edema. Cord: No cord compression or primary cord lesion. Posterior Fossa, vertebral arteries, paraspinal tissues: See results of brain MRI. Disc levels: Limited detail because of motion. Degenerative spondylosis from C3-4 through C7-T1. Narrowing of the ventral subarachnoid space but no compression of the cord. Mild bilateral foraminal narrowing throughout that region. IMPRESSION: Motion degraded  exam with limited detail. The study does not suggest any traumatic injury to the cervical spine or spinal cord. 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 No compressive canal stenosis. Bilateral foraminal narrowing through that region is likely chronic. Electronically Signed   By: Paulina Fusi M.D.   On: 05/19/2019 16:57   MR Lumbar Spine W Wo Contrast  Result Date: 05/19/2019 CLINICAL DATA:  Larey Seat.  Abnormal CT of the lumbar region. EXAM: MRI LUMBAR SPINE WITHOUT AND WITH CONTRAST TECHNIQUE: Multiplanar and multiecho pulse sequences of the lumbar spine were obtained without and with intravenous contrast. CONTRAST:  23mL GADAVIST GADOBUTROL 1 MMOL/ML IV SOLN COMPARISON:  CT 05/19/2019 FINDINGS: Segmentation:  5 lumbar type vertebral bodies. Alignment:  Curvature convex to the left the apex at L2-3. Vertebrae: Chronic discogenic endplate changes at L2-3, L4-5 and L5-S1. At L3-4, the disc space is filled with fluid intensity material. There is edema and enhancement of the L3 and L4 vertebral bodies. There is regional epidural edema and enhancement. 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 There are fluid collections within the psoas muscles bilaterally, also most consistent with infection. Conus medullaris and cauda equina: Conus extends to the L1-2 level. Conus and cauda equina appear normal. Paraspinal and other soft tissues: Bilateral psoas muscle fluid collections most consistent with abscesses. Psoas hematomas could have a similar appearance in a different setting. Disc levels: L1-2: Disc bulge.  No compressive stenosis. L2-3: Endplate osteophytes and bulging of the disc. Mild facet hypertrophy. Moderate multifactorial stenosis. L3-4: As noted above, fluid intensity material filling the disc space. Edema of the L3 and L4 vertebral bodies. Epidural edema and enhancement. 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 The picture is most consistent with infectious  discitis osteomyelitis. L4-5: Spondylosis with endplate osteophytes and chronic disc protrusion. Moderate multifactorial stenosis that could cause neural compression. L5-S1: Endplate osteophytes and bulging of the disc. No central canal stenosis. Moderate  left foraminal stenosis. 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 See above for full discussion. Electronically Signed   By: Paulina Fusi M.D.   On: 05/19/2019 17:04   DG Chest Portable 1 View  Result Date: 05/19/2019 CLINICAL DATA:  Fall, altered mental status EXAM: PORTABLE CHEST 1 VIEW COMPARISON:  04/28/2019 FINDINGS: The heart size and mediastinal contours are within normal limits. Interval resolution of previously seen right-sided airspace consolidation. No focal airspace consolidation, pleural effusion, or pneumothorax. Degenerative changes of the shoulders, right worse than left. IMPRESSION: No acute cardiopulmonary findings. Interval resolution of previously seen right-sided airspace consolidation. Electronically Signed   By: Duanne Guess D.O.   On: 05/19/2019 12:04    Labs:  CBC: Recent Labs    05/19/19 1153 05/20/19 0340  WBC 12.7* 14.1*  HGB 11.0* 9.9*  HCT 32.3* 28.9*  PLT 349 327    COAGS: Recent Labs    05/19/19 1153  INR 1.1  APTT 28    BMP: Recent Labs    05/19/19 1153 05/19/19 2045 05/20/19 0340  NA 134* 135 136  K 2.6* 2.9* 2.9*  CL 91* 93* 95*  CO2 GLUCOSE 242* 184* 257*  BUN 36* 29* 27*  CALCIUM 8.8* 8.5* 8.4*  CREATININE 0.45 0.40* 0.43*  GFRNONAA >60 >60 >60  GFRAA >60 >60 >60    LIVER FUNCTION TESTS: Recent Labs    05/19/19 1153  BILITOT 1.1  AST 15  ALT 10  ALKPHOS 161*  PROT 8.2*  ALBUMIN 3.0*    TUMOR MARKERS: No results for input(s): AFPTM, CEA, CA199, CHROMGRNA in the last 8760 hours.  Assessment and Plan: 59 y.o. female with past medical history of bipolar disorder,  diabetes, hypothyroidism who was admitted to [facility] on 5/10 with mental status changes, weight loss, vomiting, back pain and fall with trauma to the back of her head.  In the ED patient was noted to be hypokalemic, hypomagnesemic and with leukocytosis.  She has a stage II sacral decubitus ulcer as well as recent UTI.  MRI brain showed no acute or subacute infarction but small hemorrhagic contusion.  There were chronic small vessel ischemic changes.  CT scan revealed unhealed comminuted fracture involving the greater tuberosity of the proximal right femur and nonobstructive left renal calculus as well as fat-containing periumbilical hernia.  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  She is COVID-19 negative.  Blood cultures are pending.  WBC 14.1, hemoglobin 9.9, platelets 327k, PT/INR normal, creat 0.43, K 2.9.  Request now received from primary care team for image guided paraspinous fluid collection/abscess aspiration.  Imaging studies have been reviewed by Dr. Fredia Sorrow.Risks and benefits of procedure was discussed with the patient and/or patient's family (sister, Alicia Andrade) including, but not limited to bleeding, infection, damage to adjacent structures or low yield requiring additional tests.  All of the questions were answered and there is agreement to proceed.  Consent signed and in chart.  Patient had breakfast this morning and potassium remains low; since she will require IV conscious sedation for procedure we will plan case for 5/12 am  Thank you for this interesting consult.  I greatly enjoyed meeting Alicia Andrade and look forward to participating in their care.  A copy of this report was sent to the requesting provider on this date.  Electronically  Signed: D. Jeananne Rama, PA-C 05/20/2019, 10:42 AM   I spent a total of  25 minutes   in face to face in  clinical consultation, greater than 50% of which was counseling/coordinating care for CT-guided aspiration of paraspinous fluid collection/possible abscess

## 2019-05-20 NOTE — Consult Note (Signed)
NAME: Alicia Andrade  DOB: 05/04/1960  MRN: [id number]  Date/Time: 05/20/2019 12:18 PM  REQUESTING PROVIDER: Dr. Gerri Lins Subjective:  REASON FOR CONSULT: Discitis ?Patient is a limited historian.  Chart reviewed.  Spoke to the sister who is at bedside. Alicia Andrade is a 59 y.o. female with history of hypertension, diabetes mellitus, rheumatoid arthritis, stroke was brought in on 05/19/2019 with altered mental status from [facility] in [place].  As per the facility patient fell the previous night and hit her head.  Patient was not altered at the time of the fall or prior to that.  But on 05/19/2019 she was altered and blood sugar was 234 and blood pressure one 114/64.  In the ED blood pressure was 96/71, heart rate 98, respiratory rate 17, temperature 97.7And weight of 125 pounds. Labs revealed a WBC of 12.7, hemoglobin of 11, sodium of 134, potassium of 2.6, glucose of 242, creatinine of 0.45 and albumin of 3. CT head and cervical spine was done and it showed small acute/subacute appearing cortically based infarct within the  Mid to anterior left frontal lobe.  There was no acute intracranial hemorrhage. 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  There was unchanged C3-C4 and C4-C5 grade 1 anterolisthesis.  MRI of the lumbar spine showed edema of the L3 and L4 vertebral bodies.  Epidural edema and enhancement.  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  MRI of the brain did not show any acute or subacute infarction.  The left frontal abnormality seen in CT was most consistent with a small hemorrhagic contusion.  MRA angio of the neck vessels was normal. I am asked to see the patient for ruling out infection.  As per patient and sister she has not been doing well for the past 6 weeks.  She has had frequent falls in the past few weeks. She has had an abnormal gait for the past couple of years.  She has had some memory issues  for the past few months. She has baseline back pain but it is worse the past few weeks. She also has pain right hip and both knees. She was diagnosed with a fracture of the right hip in April 2021  She was diagnosed with diabetes many years ago and was on medication.  But because of her weight loss of 100 pounds in the past 2 years she stopped taking her medication at the advice of a physician..  On April 18, 2018 when she was admitted to person [facility] with altered mental status and was found to have a blood sugar of more than 600.  She stayed in the hospital until 05/08/2019 and was then transferred to rehab.  During the hospitalization she also had E. coli bacteremia and UTI.  She also had initially low platelet count.  Since her recent hospitalization she has lost another 40 pounds.  As per her sister on discharge to the rehab she was 165 pounds and now she is 125 pounds. As per sister patient had a stroke more than a decade ago and had to learn to talk and move.  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 She has got no history of alcohol consumption As per sister she kept a medical condition to herself and sister was not aware until the recent hospitalization. She suffers from bipolar disorder and is on medications. A recent 2D echo done in April 2021 showed moderate mitral stenosis. 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  She was an echo tech. Patient lives on her own.  Better houses not in a living condition because of her hoarding habit. She has a boyfriend.  She has no children.    Past Medical History:  Diagnosis Date  . Bipolar 1 disorder (HCC)   . Diabetes mellitus without complication (HCC)   . Hypokalemia   . Hypothyroidism   . Major depressive disorder   . Rhabdomyolysis   . Thrombocytopenia (HCC)     History reviewed. No pertinent surgical history.  Social History   Socioeconomic History  . Marital status: Single     Spouse name: Not on file  . Number of children: Not on file  . Years of education: Not on file  . Highest education level: Not on file  Occupational History  . Not on file  Tobacco Use  . Smoking status: Never Smoker  . Smokeless tobacco: Never Used  Substance and Sexual Activity  . Alcohol use: Not on file  . Drug use: Not on file  . Sexual activity: Not on file  Other Topics Concern  . Not on file  Social History Narrative  . Not on file   Social Determinants of Health   Financial Resource Strain:   . Difficulty of Paying Living Expenses:   Food Insecurity:   . Worried About Programme researcher, broadcasting/film/video in the Last Year:   . Barista in the Last Year:   Transportation Needs:   . Freight forwarder (Medical):   Marland Kitchen Lack of Transportation (Non-Medical):   Physical Activity:   . Days of Exercise per Week:   . Minutes of Exercise per Session:   Stress:   . Feeling of Stress :   Social Connections:   . Frequency of Communication with Friends and Family:   . Frequency of Social Gatherings with Friends and Family:   . Attends Religious Services:   . Active Member of Clubs or Organizations:   . Attends Banker Meetings:   Marland Kitchen Marital Status:   Intimate Partner Violence:   . Fear of Current or Ex-Partner:   . Emotionally Abused:   Marland Kitchen Physically Abused:   . Sexually Abused:    Family history Rheumatoid arthritis sister and mother Hypertension: Sister and mother  Current Facility-Administered Medications  Medication Dose Route Frequency Provider Last Rate Last Admin  . 0.45 % sodium chloride infusion   Intravenous Continuous Swayze, Ava, DO 125 mL/hr at 05/20/19 1207 New Bag at 05/20/19 1207  . acetaminophen (TYLENOL) tablet 650 mg  650 mg Oral Q4H PRN Swayze, Ava, DO       Or  . acetaminophen (TYLENOL) 160 MG/5ML solution 650 mg  650 mg Per Tube Q4H PRN Swayze, Ava, DO       Or  . acetaminophen (TYLENOL) suppository 650 mg  650 mg Rectal Q4H PRN Swayze, Ava,  DO      . aspirin EC tablet 325 mg  325 mg Oral Daily Swayze, Ava, DO   325 mg at 05/20/19 0838  . collagenase (SANTYL) ointment   Topical Daily Swayze, Ava, DO      . feeding supplement (GLUCERNA 1.2 CAL) liquid 1,000 mL  1,000 mL Oral TID Swayze, Ava, DO 0 mL/hr at 05/19/19 2202 1,000 mL at 05/20/19 0839  . insulin aspart (novoLOG) injection 0-15 Units  0-15 Units Subcutaneous TID WC Swayze, Ava, DO   3 Units at 05/20/19 1209  . insulin  glargine (LANTUS) injection 15 Units  15 Units Subcutaneous Daily Swayze, Ava, DO   15 Units at 05/20/19 1209  . LORazepam (ATIVAN) injection 1 mg  1 mg Intravenous Once PRN Swayze, Ava, DO      . magnesium sulfate IVPB 4 g 100 mL  4 g Intravenous Once Swayze, Ava, DO      . mupirocin ointment (BACTROBAN) 2 % 1 application  1 application Nasal BID Swayze, Ava, DO   1 application at 05/20/19 0840  . oxyCODONE (Oxy IR/ROXICODONE) immediate release tablet 5 mg  5 mg Oral Q4H PRN Manuela Schwartz, NP   5 mg at 05/20/19 0840  . pneumococcal 23 valent vaccine (PNEUMOVAX-23) injection 0.5 mL  0.5 mL Intramuscular Tomorrow-1000 Swayze, Ava, DO      . potassium chloride 10 mEq in 100 mL IVPB  10 mEq Intravenous Q1 Hr x 4 Swayze, Ava, DO 100 mL/hr at 05/20/19 1213 10 mEq at 05/20/19 1213  . simvastatin (ZOCOR) tablet 10 mg  10 mg Oral q1800 Swayze, Ava, DO         Abtx:  Anti-infectives (From admission, onward)   None      REVIEW OF SYSTEMS:  Const: negative fever, negative chills, positive weight loss Eyes: negative diplopia or visual changes, negative eye pain ENT: negative coryza, negative sore throat Resp: negative cough, hemoptysis, dyspnea Cards: negative for chest pain, palpitations, lower extremity edema GU: negative for frequency, dysuria and hematuria GI: Negative for abdominal pain, diarrhea, bleeding, constipation Skin: negative for rash and pruritus Heme: negative for easy bruising and gum/nose bleeding MS: Generalized weakness, right hip pain,  back pain, bilateral knee pain Neurolo: Confusion and memory problems Psych: negative for feelings of anxiety, depression  Endocrine: negative for thyroid, diabetes Allergy/Immunology- negative for any medication or food allergies ? Objective:  VITALS:  BP (!) 153/78 (BP Location: Right Arm)   Pulse 95   Temp 98.8 F (37.1 C) (Oral)   Resp 18   Ht 5\' 2"  (1.575 m)   Wt 57.5 kg   SpO2 99%   BMI 23.19 kg/m  PHYSICAL EXAM:  General: Alert, cooperative, no distress, temporal wasting, pale Head: Normocephalic, without obvious abnormality, atraumatic. Eyes: Conjunctivae clear, anicteric sclerae. Pupils are equal ENT Nares normal. No drainage or sinus tenderness. Poor dentition and dry mucosa Neck: Supple, symmetrical, no adenopathy, thyroid: non tender no carotid bruit and no JVD. Back: No CVA tenderness. Lungs: Bilateral air entry Heart: Regular rate and rhythm, no murmur, rub or gallop. Abdomen: Soft, umbilical hernia, non-tender,not distended. Bowel sounds normal. No masses Extremities: Bilateral bony enlargement of the knees atraumatic, no cyanosis. No edema. No clubbing Skin: No rashes or lesions. Or bruising Lymph: Cervical, supraclavicular normal. Neurologic: Grossly non-focal Pertinent Labs Lab Results CBC    Component Value Date/Time   WBC 14.1 (H) 05/20/2019 0340   RBC 3.36 (L) 05/20/2019 0340   HGB 9.9 (L) 05/20/2019 0340   HCT 28.9 (L) 05/20/2019 0340   PLT 327 05/20/2019 0340   MCV 86.0 05/20/2019 0340   MCH 29.5 05/20/2019 0340   MCHC 34.3 05/20/2019 0340   RDW 14.3 05/20/2019 0340   LYMPHSABS 1.2 05/20/2019 0340   MONOABS 0.7 05/20/2019 0340   EOSABS 0.0 05/20/2019 0340   BASOSABS 0.0 05/20/2019 0340    CMP Latest Ref Rng & Units 05/20/2019 05/19/2019 05/19/2019  Glucose 70 - 99 mg/dL 07/19/2019) 128(N) 867(E)  BUN 6 - 20 mg/dL 720(N) 47(S) 96(G)  Creatinine 0.44 - 1.00 mg/dL 83(M) 6.29(U) 7.65(Y  Sodium 135 - 145 mmol/L 136 135 134(L)  Potassium 3.5 - 5.1  mmol/L 2.9(L) 2.9(L) 2.6(LL)  Chloride 98 - 111 mmol/L 95(L) 93(L) 91(L)  CO2 22 - 32 mmol/L 31 29 29   Calcium 8.9 - 10.3 mg/dL 8.4(L) 8.5(L) 8.8(L)  Total Protein 6.5 - 8.1 g/dL - - 8.2(H)  Total Bilirubin 0.3 - 1.2 mg/dL - - 1.1  Alkaline Phos 38 - 126 U/L - - 161(H)  AST 15 - 41 U/L - - 15  ALT 0 - 44 U/L - - 10      Microbiology: Recent Results (from the past 240 hour(s))  SARS Coronavirus 2 by RT PCR (hospital order, performed in [facility] hospital lab) Nasopharyngeal Nasopharyngeal Swab     Status: None   Collection Time: 05/19/19  1:47 PM   Specimen: Nasopharyngeal Swab  Result Value Ref Range Status   SARS Coronavirus 2 NEGATIVE NEGATIVE Final    Comment: (NOTE) SARS-CoV-2 target nucleic acids are NOT DETECTED. The SARS-CoV-2 RNA is generally detectable in upper and lower respiratory specimens during the acute phase of infection. The lowest concentration of SARS-CoV-2 viral copies this assay can detect is 250 copies / mL. A negative result does not preclude SARS-CoV-2 infection and should not be used as the sole basis for treatment or other patient management decisions.  A negative result may occur with improper specimen collection / handling, submission of specimen other than nasopharyngeal swab, presence of viral mutation(s) within the areas targeted by this assay, and inadequate number of viral copies (<250 copies / mL). A negative result must be combined with clinical observations, patient history, and epidemiological information. Fact Sheet for Patients:   StrictlyIdeas.no Fact Sheet for Healthcare Providers: BankingDealers.co.za This test is not yet approved or cleared  by the [place] FDA and has been authorized for detection and/or diagnosis of SARS-CoV-2 by FDA under an Emergency Use Authorization (EUA).  This EUA will remain in effect (meaning this test can be used) for the duration of the COVID-19  declaration under Section 564(b)(1) of the Act, 21 U.S.C. section 360bbb-3(b)(1), unless the authorization is terminated or revoked sooner. Performed at [facility], [address] [place], [place] [address]     IMAGING RESULTS: Chronic discogenic endplate changes at X9-1, L4-5 and L5-S1. At L3-4, the disc space is filled with fluid intensity material. There is edema and enhancement of the L3 and L4 vertebral bodies. There is regional epidural edema and enhancement. 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 There are fluid collections within the psoas muscles bilaterally, also most consistent with infection. I have personally reviewed the films ? Impression/Recommendation ?59 year old female was been chronically ill for the past 6 to 8 weeks with uncontrolled diabetes, frequent falls, unstable gait, pain in her knees and back and weight loss. Does she have a overarching diagnosis for all of these conditions?  Low back pain.  MRI shows changes in L3-L4 disc and also bilateral psoas collection.  Need to rule out discitis and psoas abscess. She will need drainage of the psoas abscess to be sent for culture. Not sure whether I can explain her above symptoms due to this or whether this was recent. She did have a PICC line in her last admission to person [facility]. She also had E. coli bacteremia in April 2021 at the previous hospital. Hold off antibiotics as she is clinically stable until the procedure is done and cultures have been sent.  Diabetes mellitus poorly controlled.  In April 2020  hemoglobin was more than 14 but now it is 8.8.  Hypokalemia, hyponatremia and hypomagnesemia ? Weight loss and malnutrition.  Unclear etiology Rule out malignancy.  History of frequent falls ?neuropathy  Encephalopathy and memory loss  History of's CVA.  Currently MRI brain shows left frontal hemorrhagic contusion  History of rheumatoid arthritis not on any  medication  We will check thyroid function tests, cortisol, B12, folate, RPR and HIV Will need 2D echo and blood culture.  TEE if needed..  ___________________________________________________ Discussed with patient, her sister and requesting provider Note:  This document was prepared using Dragon voice recognition software and may include unintentional dictation errors.

## 2019-05-20 NOTE — Consult Note (Signed)
Reason for Consult:AMS Requesting Physician: Swayze  CC: AMS  I have been asked by Dr. Gerri Lins to see this patient in consultation for AMS.  HPI: Alicia Andrade is an 59 y.o. female who has a history of stroke (affecting speech), BPD, DM, hypothyroidism and thrombocytopenia who had a fall in April.  Was hospitalized at an outside facility after the fall and noted to have blood sugars over 600.  Patient later during that hospitalization found to have a right hip fracture as well.  This hospitalization was prolonged but patient was discharged to rehab on at the end of April.  About 5 days prior to discharge, sister noted that the patient was confused.  Was told it was hospital delirium at that time.  Patient has had continued decline and has had what appears to be multiple falls at her facility.  Patient does not appear to have been able to walk since her admission at the outside facility.  Needs extensive help for transfers.  Reports bladder incontinence as as well.    Past Medical History:  Diagnosis Date  . Bipolar 1 disorder (HCC)   . Diabetes mellitus without complication (HCC)   . Hypokalemia   . Hypothyroidism   . Major depressive disorder   . Rhabdomyolysis   . Thrombocytopenia (HCC)     History reviewed. No pertinent surgical history.  History reviewed. No pertinent family history.  Social History:  reports that she has never smoked. She has never used smokeless tobacco. No history on file for alcohol and drug.  Not on File  Medications:  I have reviewed the patient's current medications. Prior to Admission:  Medications Prior to Admission  Medication Sig Dispense Refill Last Dose  . gabapentin (NEURONTIN) 300 MG capsule Take 300 mg by mouth 3 (three) times daily.     . LamoTRIgine 200 MG TB24 24 hour tablet Take 1 tablet by mouth daily.     Marland Kitchen levothyroxine (SYNTHROID) 125 MCG tablet Take 125 mcg by mouth daily.     Marland Kitchen olmesartan-hydrochlorothiazide (BENICAR HCT) 20-12.5 MG  tablet Take 1 tablet by mouth daily.     Marland Kitchen venlafaxine XR (EFFEXOR-XR) 150 MG 24 hr capsule Take 150 mg by mouth daily.      Scheduled: . aspirin EC  325 mg Oral Daily  . collagenase   Topical Daily  . feeding supplement (GLUCERNA 1.2 CAL)  1,000 mL Oral TID  . insulin aspart  0-15 Units Subcutaneous TID WC  . insulin glargine  15 Units Subcutaneous Daily  . mupirocin ointment  1 application Nasal BID  . pneumococcal 23 valent vaccine  0.5 mL Intramuscular Tomorrow-1000  . potassium chloride  40 mEq Oral Once  . simvastatin  10 mg Oral q1800    ROS: History obtained from the patient  General ROS: weight loss Psychological ROS: memory difficulties, confusion Ophthalmic ROS: negative for - blurry vision, double vision, eye pain or loss of vision ENT ROS: negative for - epistaxis, nasal discharge, oral lesions, sore throat, tinnitus or vertigo Allergy and Immunology ROS: negative for - hives or itchy/watery eyes Hematological and Lymphatic ROS: negative for - bleeding problems, bruising or swollen lymph nodes Endocrine ROS: negative for - galactorrhea, hair pattern changes, polydipsia/polyuria or temperature intolerance Respiratory ROS: negative for - cough, hemoptysis, shortness of breath or wheezing Cardiovascular ROS: negative for - chest pain, dyspnea on exertion, edema or irregular heartbeat Gastrointestinal ROS: negative for - abdominal pain, diarrhea, hematemesis, nausea/vomiting or stool incontinence Genito-Urinary ROS: as noted in HPI  Musculoskeletal ROS: inability to use the LUE and RLE, back pain Neurological ROS: as noted in HPI Dermatological ROS: pressure ulcer  Physical Examination: Blood pressure (!) 153/78, pulse 95, temperature 98.8 F (37.1 C), temperature source Oral, resp. rate 18, height 5\' 2"  (1.575 m), weight 57.5 kg, SpO2 99 %.  HEENT-  Normocephalic, no lesions, without obvious abnormality.  Normal external eye and conjunctiva.  Normal TM's bilaterally.   Normal auditory canals and external ears. Normal external nose, mucus membranes and septum.  Normal pharynx. Cardiovascular- S1, S2 normal, pulses palpable throughout   Lungs- chest clear, no wheezing, rales, normal symmetric air entry Abdomen- soft, non-tender; bowel sounds normal; no masses,  no organomegaly Extremities- no edema Lymph-no adenopathy palpable Musculoskeletal-no joint tenderness, deformity or swelling Skin-pressure ulcer  Neurological Examination   Mental Status: Alert, some confusion in her ability to be able to provide history.  Word substitutions noted.   Able to follow 3 step commands without difficulty. Cranial Nerves: II: Discs flat bilaterally; Visual fields grossly normal, pupils equal, round, reactive to light and accommodation III,IV, VI: ptosis not present, extra-ocular motions intact bilaterally V,VII: smile symmetric, facial light touch sensation decreased on the left VIII: hearing normal bilaterally IX,X: gag reflex present XI: bilateral shoulder shrug XII: midline tongue extension Motor: Movement noted in the RLE but unable to lift off the bed.  Unable to lift the LUE beyond 90 degrees but when arm actively lifted above that level patient is unable to maintain.  Full strength noted otherwise.   Sensory: Pinprick and light touch decreased in the LLE Deep Tendon Reflexes: Symmetric throughout Plantars: Right: mute   Left: mute Cerebellar: Normal finger-to-nose testing bilaterally Gait: not tested due to safety concerns    Laboratory Studies:   Basic Metabolic Panel: Recent Labs  Lab 05/19/19 1153 05/19/19 2045 05/20/19 0340 05/20/19 0837  NA 134* 135 136  --   K 2.6* 2.9* 2.9*  --   CL 91* 93* 95*  --   CO2 29 29 31   --   GLUCOSE 242* 184* 257*  --   BUN 36* 29* 27*  --   CREATININE 0.45 0.40* 0.43*  --   CALCIUM 8.8* 8.5* 8.4*  --   MG 1.7  --   --  1.4*    Liver Function Tests: Recent Labs  Lab 05/19/19 1153  AST 15  ALT 10   ALKPHOS 161*  BILITOT 1.1  PROT 8.2*  ALBUMIN 3.0*   No results for input(s): LIPASE, AMYLASE in the last 168 hours. No results for input(s): AMMONIA in the last 168 hours.  CBC: Recent Labs  Lab 05/19/19 1153 05/20/19 0340  WBC 12.7* 14.1*  NEUTROABS 11.5* 12.0*  HGB 11.0* 9.9*  HCT 32.3* 28.9*  MCV 88.7 86.0  PLT 349 327    Cardiac Enzymes: No results for input(s): CKTOTAL, CKMB, CKMBINDEX, TROPONINI in the last 168 hours.  BNP: Invalid input(s): POCBNP  CBG: Recent Labs  Lab 05/20/19 0023 05/20/19 0753  GLUCAP 281* 223*    Microbiology: Results for orders placed or performed during the hospital encounter of 05/19/19  SARS Coronavirus 2 by RT PCR (hospital order, performed in [facility] hospital lab) Nasopharyngeal Nasopharyngeal Swab     Status: None   Collection Time: 05/19/19  1:47 PM   Specimen: Nasopharyngeal Swab  Result Value Ref Range Status   SARS Coronavirus 2 NEGATIVE NEGATIVE Final    Comment: (NOTE) SARS-CoV-2 target nucleic acids are NOT DETECTED. The SARS-CoV-2 RNA is generally  detectable in upper and lower respiratory specimens during the acute phase of infection. The lowest concentration of SARS-CoV-2 viral copies this assay can detect is 250 copies / mL. A negative result does not preclude SARS-CoV-2 infection and should not be used as the sole basis for treatment or other patient management decisions.  A negative result may occur with improper specimen collection / handling, submission of specimen other than nasopharyngeal swab, presence of viral mutation(s) within the areas targeted by this assay, and inadequate number of viral copies (<250 copies / mL). A negative result must be combined with clinical observations, patient history, and epidemiological information. Fact Sheet for Patients:   BoilerBrush.com.cy Fact Sheet for Healthcare Providers: https://pope.com/ This test is not yet  approved or cleared  by the [place] FDA and has been authorized for detection and/or diagnosis of SARS-CoV-2 by FDA under an Emergency Use Authorization (EUA).  This EUA will remain in effect (meaning this test can be used) for the duration of the COVID-19 declaration under Section 564(b)(1) of the Act, 21 U.S.C. section 360bbb-3(b)(1), unless the authorization is terminated or revoked sooner. Performed at [facility], [address] [place], [place] [address]     Coagulation Studies: Recent Labs    05/19/19 1153  LABPROT 14.2  INR 1.1    Urinalysis: No results for input(s): COLORURINE, LABSPEC, PHURINE, GLUCOSEU, HGBUR, BILIRUBINUR, KETONESUR, PROTEINUR, UROBILINOGEN, NITRITE, LEUKOCYTESUR in the last 168 hours.  Invalid input(s): APPERANCEUR  Lipid Panel:     Component Value Date/Time   CHOL 174 05/20/2019 0340   TRIG 140 05/20/2019 0340   HDL 37 (L) 05/20/2019 0340   CHOLHDL 4.7 05/20/2019 0340   VLDL 28 05/20/2019 0340   LDLCALC 109 (H) 05/20/2019 0340    HgbA1C: No results found for: HGBA1C  Urine Drug Screen:  No results found for: LABOPIA, COCAINSCRNUR, LABBENZ, AMPHETMU, THCU, LABBARB  Alcohol Level: No results for input(s): ETH in the last 168 hours.  Other results: EKG: sinus rhythm at 96 bpm.  Imaging: CT ABDOMEN PELVIS WO CONTRAST  Result Date: 05/19/2019 CLINICAL DATA:  Status post fall. EXAM: CT ABDOMEN AND PELVIS WITHOUT CONTRAST TECHNIQUE: Multidetector CT imaging of the abdomen and pelvis was performed following the standard protocol without IV contrast. COMPARISON:  April 21, 2019. FINDINGS: Lower chest: No acute abnormality. Hepatobiliary: Solitary gallstone is noted. No biliary dilatation is noted. However left hepatic pneumobilia is noted. Pancreas: Unremarkable. No pancreatic ductal dilatation or surrounding inflammatory changes. Spleen: Normal in size without focal abnormality. Adrenals/Urinary Tract: Adrenal glands appear normal.  Small nonobstructive left renal calculus is noted. No hydronephrosis or renal obstruction is noted. Mild urinary bladder distention is noted. Stomach/Bowel: Stomach is within normal limits. Appendix appears normal. No evidence of bowel wall thickening, distention, or inflammatory changes. Vascular/Lymphatic: Aortic atherosclerosis. No enlarged abdominal or pelvic lymph nodes. Reproductive: Uterus and bilateral adnexa are unremarkable. Other: Moderate size fat containing periumbilical hernia is noted in the pelvis. No ascites is noted. Musculoskeletal: Un healed comminuted fracture is seen involving the greater tuberosity of the proximal right femur. Multilevel degenerative disc disease is noted in the lumbar spine. There appears to be lytic destruction involving the adjacent articular surfaces of L3-4 disc space concerning for discitis and osteomyelitis. MRI is recommended for further evaluation. IMPRESSION: 1. Unhealed comminuted fracture is seen involving the greater tuberosity of the proximal right femur. 2. There appears to be lytic destruction involving the adjacent vertebral endplates of L3-4 disc space concerning for discitis and osteomyelitis. MRI is  recommended for further evaluation. 3. Solitary gallstone is noted. Left hepatic pneumobilia is noted which may be due to prior sphincterotomy. 4. Small nonobstructive left renal calculus. 5. Moderate size fat containing periumbilical hernia. Aortic Atherosclerosis (ICD10-I70.0). Electronically Signed   By: Marijo Conception M.D.   On: 05/19/2019 12:27   CT Head Wo Contrast  Result Date: 05/19/2019 CLINICAL DATA:  Head trauma, headache. Neck trauma, uncomplicated. Additional history provided: Reported fall last night hitting head. EXAM: CT HEAD WITHOUT CONTRAST CT CERVICAL SPINE WITHOUT CONTRAST TECHNIQUE: Multidetector CT imaging of the head and cervical spine was performed following the standard protocol without intravenous contrast. Multiplanar CT image  reconstructions of the cervical spine were also generated. COMPARISON:  Head CT 04/19/2019, CT cervical spine 07/03/2018. FINDINGS: CT HEAD FINDINGS Brain: There is a small acute/subacute appearing cortical infarct within the mid to anterior left frontal lobe. This was not present on prior head CT 04/19/2019 (series 2, image 19) (series 4, image 16). Scattered ill-defined hypoattenuation within the cerebral white matter is unchanged. Stable, mild generalized parenchymal atrophy. There is no acute intracranial hemorrhage. No extra-axial fluid collection. No evidence of intracranial mass. No midline shift. Vascular: No hyperdense vessel.  Atherosclerotic calcifications. Skull: Normal. Negative for fracture or focal lesion. Sinuses/Orbits: Visualized orbits show no acute finding. Minimal ethmoid sinus mucosal thickening. Trace fluid within the bilateral mastoid air cells. CT CERVICAL SPINE FINDINGS Alignment: Straightening of the expected cervical lordosis. Mild C3-C4 and C4-C5 grade 1 anterolisthesis. Skull base and vertebrae: The basion-dental and atlanto-dental intervals are maintained.No evidence of acute fracture to the cervical spine. Soft tissues and spinal canal: No prevertebral fluid or swelling. No visible canal hematoma. Disc levels: Cervical spondylosis with multilevel disc space narrowing, posterior disc osteophytes, uncovertebral and facet hypertrophy. Disc space narrowing is severe at C5-C6, C6-C7, C7-T1, T1-T2 and T2-T3. 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 Multilevel bony spinal canal stenosis, greatest at C5-C6 and C6-C7 (at least moderate in severity at these levels). Bulky multilevel ventrolateral osteophytes. Redemonstrated multilevel degenerative fusion. Upper chest: No consolidation within the imaged lung apices. No visible pneumothorax. Other: Atherosclerotic plaque within the proximal major branch vessels of the neck and bilateral carotid arteries. IMPRESSION:  CT head: 1. Small acute/subacute appearing cortically based infarct within the mid to anterior left frontal lobe. This finding was not present on prior head CT 04/19/2019. 2. No acute intracranial hemorrhage 3. Stable nonspecific cerebral white matter disease, most commonly due to small vessel ischemia. 4. Stable mild generalized parenchymal atrophy. 5. Trace bilateral mastoid effusions. CT cervical spine: 1. No evidence of acute fracture to the cervical spine. 2. 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 3. Unchanged C3-C4 and C4-C5 grade 1 anterolisthesis. Electronically Signed   By: Kellie Simmering DO   On: 05/19/2019 12:36   CT Cervical Spine Wo Contrast  Result Date: 05/19/2019 CLINICAL DATA:  Head trauma, headache. Neck trauma, uncomplicated. Additional history provided: Reported fall last night hitting head. EXAM: CT HEAD WITHOUT CONTRAST CT CERVICAL SPINE WITHOUT CONTRAST TECHNIQUE: Multidetector CT imaging of the head and cervical spine was performed following the standard protocol without intravenous contrast. Multiplanar CT image reconstructions of the cervical spine were also generated. COMPARISON:  Head CT 04/19/2019, CT cervical spine 07/03/2018. FINDINGS: CT HEAD FINDINGS Brain: There is a small acute/subacute appearing cortical infarct within the mid to anterior left frontal lobe. This was not present on prior head CT 04/19/2019 (series 2, image 19) (series 4, image  16). Scattered ill-defined hypoattenuation within the cerebral white matter is unchanged. Stable, mild generalized parenchymal atrophy. There is no acute intracranial hemorrhage. No extra-axial fluid collection. No evidence of intracranial mass. No midline shift. Vascular: No hyperdense vessel.  Atherosclerotic calcifications. Skull: Normal. Negative for fracture or focal lesion. Sinuses/Orbits: Visualized orbits show no acute finding. Minimal ethmoid sinus mucosal thickening. Trace  fluid within the bilateral mastoid air cells. CT CERVICAL SPINE FINDINGS Alignment: Straightening of the expected cervical lordosis. Mild C3-C4 and C4-C5 grade 1 anterolisthesis. Skull base and vertebrae: The basion-dental and atlanto-dental intervals are maintained.No evidence of acute fracture to the cervical spine. Soft tissues and spinal canal: No prevertebral fluid or swelling. No visible canal hematoma. Disc levels: Cervical spondylosis with multilevel disc space narrowing, posterior disc osteophytes, uncovertebral and facet hypertrophy. Disc space narrowing is severe at C5-C6, C6-C7, C7-T1, T1-T2 and T2-T3. 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 Multilevel bony spinal canal stenosis, greatest at C5-C6 and C6-C7 (at least moderate in severity at these levels). Bulky multilevel ventrolateral osteophytes. Redemonstrated multilevel degenerative fusion. Upper chest: No consolidation within the imaged lung apices. No visible pneumothorax. Other: Atherosclerotic plaque within the proximal major branch vessels of the neck and bilateral carotid arteries. IMPRESSION: CT head: 1. Small acute/subacute appearing cortically based infarct within the mid to anterior left frontal lobe. This finding was not present on prior head CT 04/19/2019. 2. No acute intracranial hemorrhage 3. Stable nonspecific cerebral white matter disease, most commonly due to small vessel ischemia. 4. Stable mild generalized parenchymal atrophy. 5. Trace bilateral mastoid effusions. CT cervical spine: 1. No evidence of acute fracture to the cervical spine. 2. 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 3. Unchanged C3-C4 and C4-C5 grade 1 anterolisthesis. Electronically Signed   By: Jackey Loge DO   On: 05/19/2019 12:36   MR ANGIO NECK W WO CONTRAST  Result Date: 05/19/2019 CLINICAL DATA:  Fall with altered mental status. EXAM: MRA NECK WITHOUT AND WITH CONTRAST  TECHNIQUE: Multiplanar and multiecho pulse sequences of the neck were obtained without and with intravenous contrast. Angiographic images of the neck were obtained using MRA technique without and with intravenous contrast. CONTRAST:  90mL GADAVIST GADOBUTROL 1 MMOL/ML IV SOLN COMPARISON:  None. FINDINGS: Branching pattern is normal without origin stenosis. Both common carotid arteries widely patent to the bifurcation. Both carotid bifurcations widely patent. Cervical internal carotid arteries widely patent. Both vertebral artery origins widely patent. Both vertebral arteries are widely patent through the cervical region to the foramen magnum. Circle Willis vessels included on this scan. Low resolution evaluation shows patency of the major vessels without evidence of aneurysm or correctable proximal stenosis. IMPRESSION: Negative MR angiography of the neck vessels. No stenosis or dissection. Electronically Signed   By: Paulina Fusi M.D.   On: 05/19/2019 16:53   MR BRAIN WO CONTRAST  Result Date: 05/19/2019 CLINICAL DATA:  Fall with altered mental status. Left frontal infarction seen by CT. EXAM: MRI HEAD WITHOUT CONTRAST TECHNIQUE: Multiplanar, multiecho pulse sequences of the brain and surrounding structures were obtained without intravenous contrast. COMPARISON:  Head CT earlier same day. FINDINGS: Brain: Diffusion imaging does not show any acute or subacute infarction. The left frontal abnormality described by CT does not show restricted diffusion but shows an area of edema with small areas of internal microhemorrhage, most consistent with a small hemorrhagic contusion. The differential diagnosis does include late subacute infarction, but I favor contusion. Elsewhere, the brainstem shows chronic small-vessel  ischemic changes. No focal cerebellar insult. Mild chronic small-vessel ischemic change elsewhere affects the cerebral hemispheric white matter. No hydrocephalus. No extra-axial collection Vascular: Major  vessels at the base of the brain show flow. Skull and upper cervical spine: Negative Sinuses/Orbits: Clear/normal Other: None IMPRESSION: No acute or subacute infarction. The left frontal abnormality shown by CT is most consistent with a small hemorrhagic contusion. Elsewhere, the brain shows chronic small-vessel ischemic changes. Electronically Signed   By: Paulina Fusi M.D.   On: 05/19/2019 16:51   MR THORACIC SPINE WO CONTRAST  Result Date: 05/19/2019 CLINICAL DATA:  Mental status changes.  Fall. EXAM: MRI THORACIC SPINE WITHOUT CONTRAST TECHNIQUE: Multiplanar, multisequence MR imaging of the thoracic spine was performed. No intravenous contrast was administered. COMPARISON:  None. FINDINGS: Alignment:  No traumatic malalignment. Vertebrae: No thoracic region fracture. Cord:  No cord compression or primary cord lesion. Paraspinal and other soft tissues: Negative Disc levels: Detail is limited because of motion. Degenerative spondylosis and facet arthritis in the thoracic region as often seen at this age. No evidence of compressive canal stenosis. Foraminal stenosis on the right at T4-5 due to osteophytic encroachment could possibly affect the right C5 nerve. The other foramina appear sufficiently patent. IMPRESSION: Motion degraded study. No evidence of acute thoracic region fracture or cord injury. Degenerative spondylosis and facet arthropathy, with limited evaluation due to motion. No apparent compressive canal stenosis. Right foraminal narrowing at T4-5 because of osteophytic encroachment. Electronically Signed   By: Paulina Fusi M.D.   On: 05/19/2019 16:59   MR CERVICAL SPINE W WO CONTRAST  Result Date: 05/19/2019 CLINICAL DATA:  Fall with trauma to the head and neck. EXAM: MRI CERVICAL SPINE WITHOUT AND WITH CONTRAST TECHNIQUE: Multiplanar and multiecho pulse sequences of the cervical spine, to include the craniocervical junction and cervicothoracic junction, were obtained without and with  intravenous contrast. CONTRAST:  5mL GADAVIST GADOBUTROL 1 MMOL/ML IV SOLN COMPARISON:  CT earlier same day. FINDINGS: The study suffers from considerable motion degradation. Alignment: Straightening of the normal cervical lordosis. No traumatic malalignment. Vertebrae: No evidence of any fracture or bone marrow edema. Cord: No cord compression or primary cord lesion. Posterior Fossa, vertebral arteries, paraspinal tissues: See results of brain MRI. Disc levels: Limited detail because of motion. Degenerative spondylosis from C3-4 through C7-T1. Narrowing of the ventral subarachnoid space but no compression of the cord. Mild bilateral foraminal narrowing throughout that region. IMPRESSION: Motion degraded exam with limited detail. The study does not suggest any traumatic injury to the cervical spine or spinal cord. 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 No compressive canal stenosis. Bilateral foraminal narrowing through that region is likely chronic. Electronically Signed   By: Paulina Fusi M.D.   On: 05/19/2019 16:57   MR Lumbar Spine W Wo Contrast  Result Date: 05/19/2019 CLINICAL DATA:  Larey Seat.  Abnormal CT of the lumbar region. EXAM: MRI LUMBAR SPINE WITHOUT AND WITH CONTRAST TECHNIQUE: Multiplanar and multiecho pulse sequences of the lumbar spine were obtained without and with intravenous contrast. CONTRAST:  5mL GADAVIST GADOBUTROL 1 MMOL/ML IV SOLN COMPARISON:  CT 05/19/2019 FINDINGS: Segmentation:  5 lumbar type vertebral bodies. Alignment:  Curvature convex to the left the apex at L2-3. Vertebrae: Chronic discogenic endplate changes at L2-3, L4-5 and L5-S1. At L3-4, the disc space is filled with fluid intensity material. There is edema and enhancement of the L3 and L4 vertebral bodies. There is regional epidural edema and enhancement. 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 There are fluid  collections within the psoas muscles bilaterally, also most consistent with  infection. Conus medullaris and cauda equina: Conus extends to the L1-2 level. Conus and cauda equina appear normal. Paraspinal and other soft tissues: Bilateral psoas muscle fluid collections most consistent with abscesses. Psoas hematomas could have a similar appearance in a different setting. Disc levels: L1-2: Disc bulge.  No compressive stenosis. L2-3: Endplate osteophytes and bulging of the disc. Mild facet hypertrophy. Moderate multifactorial stenosis. L3-4: As noted above, fluid intensity material filling the disc space. Edema of the L3 and L4 vertebral bodies. Epidural edema and enhancement. 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 L4-5: Spondylosis with endplate osteophytes and chronic disc protrusion. Moderate multifactorial stenosis that could cause neural compression. L5-S1: Endplate osteophytes and bulging of the disc. No central canal stenosis. Moderate left foraminal stenosis. 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 See above for full discussion. Electronically Signed   By: Paulina Fusi M.D.   On: 05/19/2019 17:04   DG Chest Portable 1 View  Result Date: 05/19/2019 CLINICAL DATA:  Fall, altered mental status EXAM: PORTABLE CHEST 1 VIEW COMPARISON:  04/28/2019 FINDINGS: The heart size and mediastinal contours are within normal limits. Interval resolution of previously seen right-sided airspace consolidation. No focal airspace consolidation, pleural effusion, or pneumothorax. Degenerative changes of the shoulders, right worse than left. IMPRESSION: No acute cardiopulmonary findings. Interval resolution of previously seen right-sided airspace consolidation. Electronically Signed   By: Duanne Guess D.O.   On: 05/19/2019 12:04     Assessment/Plan: 59 y.o. female who has a history of stroke (affecting  speech), BPD, DM, hypothyroidism and thrombocytopenia who presents with altered mental status.  History is complicated by the fact that it blends with a fall and hip fracture that occurred recently as well prior to the development of the mental status changes.  Findings on neurological examination reveal left and right sided abnormalities. MRI of the cervical, thoracic and lumbar spines have ben performed and personally reviewed.  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  Although this may explain the patient's altered mental status being secondary  to a metabolic encephaopathy, RLE weakness and incontinence it does not explain her left sided numbness and LUE weakness.  Head CT was concerning for an infarct but I have personally reviewed the MRI of the brain and no acute infarcts are noted.  Possible hemorrhagic contusion seen.  MRA of the neck is unremarkable.    Recommendations: 1. ID consult 2. Neurosurgery involvement 3. IR to be contacted for possible aspiration of abscess to obtain an organism 4. Left shoulder films 5. Echocardiogram  Thana Farr, MD Neurology [phone] 05/20/2019, 10:42 AM

## 2019-05-20 NOTE — Progress Notes (Signed)
PROGRESS NOTE  Alicia Andrade VOZ:[id number] DOB: 06/06/1960 DOA: 05/19/2019 PCP: Galvin Proffer, MD  Brief History   The patient is a 59 yr old woman who presented from [facility] where she was undergoing rehab for fracture of her proximal right femur. She presented with complaints of altered mental status, vomiting, and falls. She did sustain trauma to the back of her head from a fall that occurred 05/18/2019. 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 She had been having some difficulty with learning new techniques and processes at work according to her sister. Over the past 2 years the patient had lost 120 lbs. During this time she had stopped taking insulin as she felt that she no longer needed it. It appears from pharmacy's medication reconciliation that her glucoses were neither followed or treated at [facility] center. As per her sister, the patient's weight has dropped from 167 2 weeks ago to 125 upon admission.  The patient has been admitted to a progressive bed. MRI brain demonstrated no infarct as was suggested by CT, but a cerebral contusion instead. MRI of th eL spine demonstrated discitis at L3, L4 as well as bilateral psoas abscesses. Interventional radiology was consulted to obtain cultures from paraspinal space/psoas abscesses. They feel that abscesses are too small to be drained.  Neurology has been consulted. They had recommended neurosurgery consult. Dr. Adriana Simas has evaluated the patient and feels that there is no need for intervention at this time. They also recommend aspiration of intervertebral space to obtain cultures. They recommended treatment with antibiotics.   Consultants  . Neurology . Neurosurgery . Infectious Disease . Interventional radiology  Procedures  . None  Antibiotics  . None  Subjective  The patient's mental status has much improved. She has no new complaints.  Objective   Vitals:  Vitals:   05/20/19 0318  05/20/19 0753  BP: 117/69 (!) 153/78  Pulse: (!) 102 95  Resp: 17 18  Temp: 98.7 F (37.1 C) 98.8 F (37.1 C)  SpO2: 97% 99%   Exam:  Constitutional:  . The patient is awake, alert, and oriented x 3. No acute distress. Eyes:  . pupils and irises appear normal . Normal lids and conjunctivae . EOMBI Respiratory:  . No increased work of breathing. . No wheezes, rales, or rhonchi . No tactile fremitus Cardiovascular:  . Regular rate and rhythm . No murmurs, ectopy, or gallups. . No lateral PMI. No thrills. Abdomen:  . Abdomen is soft, non-tender, non-distended . No hernias, masses, or organomegaly . Normoactive bowel sounds.  Musculoskeletal:  . No cyanosis, clubbing, or edema Skin:  . No rashes, lesions, ulcers . palpation of skin: no induration or nodules Neurologic:  . CN 2-12 intact . Sensation all 4 extremities intact Psychiatric:  . Mental status o Mood, affect appropriate o Orientation to person, place, time  . judgment and insight appear intact  I have personally reviewed the following:   Today's Data  . Vitals, BMP, Magnesium, Lipid panel, CBC  Micro Data  . Blood cultures x 2 have had no growth.  Imaging  . CT head . MRI brain . MRA neck . MRI C-spine, T-spine, L-spine  Cardiology Data  . EKG . Echocardiogram: EF 50-55% with Grade 1 diastolic dysfunction. RV normal.  Scheduled Meds: . aspirin EC  325 mg Oral Daily  . collagenase   Topical Daily  . feeding supplement (GLUCERNA 1.2 CAL)  1,000 mL Oral TID  .  gabapentin  300 mg Oral TID  . insulin aspart  0-15 Units Subcutaneous TID WC  . insulin glargine  15 Units Subcutaneous Daily  . LamoTRIgine  1 tablet Oral Daily  . levothyroxine  125 mcg Oral Daily  . mupirocin ointment  1 application Nasal BID  . pneumococcal 23 valent vaccine  0.5 mL Intramuscular Tomorrow-1000  . simvastatin  10 mg Oral q1800  . [START ON 05/21/2019] venlafaxine XR  150 mg Oral Q breakfast   Continuous  Infusions: . sodium chloride 125 mL/hr at 05/20/19 1207  . magnesium sulfate bolus IVPB    . potassium chloride 10 mEq (05/20/19 1213)   Problem  Altered Mental Status  Cognitive Decline  DM Hyperosmolarity Type II, Uncontrolled (Hcc)  Hypothyroidism  Bipolar Disorder (Hcc)  Discitis of Lumbar Region  Psoas Muscle Abscess (Hcc)  Cerebral Contusion (Hcc)  Debility  Frequent Falls  Stroke (Hcc)  Pressure Injury of Skin     LOS: 1 day   A & P   Acute CVA of the mid - anterior frontal lobe: No evidence of CVA on MRI, although CVA was identified on CT head. Neurology was consulted and stroke work up to include Echocardiogram, MRI, and MRA of the neck has been initiated. PT/OT eval and treat. Start ASA, statin, will be lenient with BP control for the next 48 hours.   Cerebral contusion: MRI brain demonstrates cerebral contusion of the brain where area of "acute CVA" that was noted on CT head. Aspirin and lovenox have been discontinued due to area of contusion and concern for further intracranial hemorrhage.  Lytic lesions in the lumbar spine: MRI of the cervical, throracic, and lumbar has been performed. L-spine MRI demonstrated L3-L4 discitis and bilateral psoas abscesses.  SPEP and UPEP have been ordered. Interventional radiology has been consulted to to obtain aspirate from paraspinal area of discitis in the L-spine.   Weight loss: Very fast, although patient states that it was intentional. It has continued to accelerate per the sister during her last 2 weeks in the SNF.Benjamine Mola has stated that the patient has lost from 167 to 125 in the last 2 weeks at the [facility]. Nutrition consult.  Hypotension: Monitor.  Falls: PT/OT eval and treat.  Bipolar disorder: Continue home medications.   DM II: Will follow FSBS with [facility]. Over the past 24 hours blood sugars have run from 187 - 281. Lantus 15 units daily has been ordered. Pharmacy has reconciled the patient's medications from  the [facility]. It seems that the patient's glucoses were neither followed or addressed while she was inpatient.  Hypothyroidism: Check TSH. Continue levothyroxine as at home.  Major depressive syndrome: Continue home medications.  Rhabdomyolysis: Check CK. Monitor.  I have seen and examined this patient myself. I have spent 38 minutes in her evaluation and care.  DVT Prophylaxis: Lovenox CODE STATUS: Full Code Family Communication: Patient's sister, Benjamine Mola Disposition: Patient is from SNF. It is anticipated that she will dc to SNF. Barriers to discharge: Need for work up of lytic lesions, stroke work up, completion of evaluation including PT/OT.  Carrol Hougland, DO [facility] Direct contact: see [email or web address]  7PM-7AM contact night coverage as above 05/20/2019, 1:06 PM  LOS: 1 day

## 2019-05-20 NOTE — Progress Notes (Signed)
Initial Nutrition Assessment  DOCUMENTATION CODES:   Non-severe (moderate) malnutrition in context of social or environmental circumstances  INTERVENTION:  Provide Ensure Max Protein po BID, each supplement provides 150 kcal and 30 grams of protein. Patient prefers chocolate.  Provide daily MVI.  Encouraged adequate intake of calories and protein at meals.  NUTRITION DIAGNOSIS:   Moderate Malnutrition related to social / environmental circumstances(inadequate oral intake) as evidenced by mild fat depletion, mild muscle depletion, moderate muscle depletion.  GOAL:   Patient will meet greater than or equal to 90% of their needs  MONITOR:   PO intake, Supplement acceptance, Labs, Weight trends, I & O's, Skin  REASON FOR ASSESSMENT:   Malnutrition Screening Tool, Consult Assessment of nutrition requirement/status, Wound healing, Poor PO, Hip fracture protocol  ASSESSMENT:   59 year old female with PMHx of DM, bipolar 1 disorder, major depressive disorder, hypothyroidism who presents from [facility] where she was undergoing rehab for fracture of her proximal right femur with AMS, vomiting, and falls. Patient found to have acute CVA of mid-anterior frontal lobe, cerebral contusion, lytic lesions in lumbar spine.   Met with patient at bedside with SLP. RD later also returned to meet with patient once her sister was present in room. Patient reports she has had a decreased appetite for the past 8-9 weeks. She reports she has been eating small amounts at meals. She may only have crackers for breakfast. She reports eating a small lunch. For dinner she may have something soft like macaroni and cheese. She reports she enjoys chocolate Glucerna. When RD returned patient's sister was helping patient eat spaghetti at lunch. She reports patient needs soft foods at meals. Patient reports she will be able to order foods she can tolerate and does not want her diet downgraded to mechanical soft at  this time. Discussed oral nutrition supplement options and patient would like to try chocolate Ensure Max Protein. Patient's sister reported that even with Glucerna her CBGs are elevated so they wanted to try a different ONS. Encouraged adequate intake of calorie and protein at meals.  Patient reports her UBW used to be close to 300 lbs. She originally began losing weight intentionally but then continued to lose weight more quickly after she lost her appetite. She reports she has lost approximately 120 lbs over the past 2 years. She reports that she was 167 lbs 2-3 weeks ago. She is now 57.5 kg (126.76 lbs). That would be a weight loss of approximately 40 lbs (24% body weight) over 2 weeks, which is significant for time frame. However, this amount of weight loss likely occurred over a longer period of time. The admission weight at [facility] may have been inaccurate. No weight history available in chart to trend.  Medications reviewed and include: Novolog 0-15 units TID, Lantus 15 units daily, levothyroxine, 1/2 NS at 125 mL/hr, magnesium sulfate 4 grams IV once today, potassium chloride 10 mEQ IV x 4 today.  Labs reviewed: CBG 187-281, Potassium 2.1, chloride 95, BUN 27, Creatinine 0.43, Magnesium 1.4.  NUTRITION - FOCUSED PHYSICAL EXAM:    Most Recent Value  Orbital Region  Mild depletion  Upper Arm Region  Moderate depletion  Thoracic and Lumbar Region  Mild depletion  Buccal Region  Mild depletion  Temple Region  Moderate depletion  Clavicle Bone Region  Mild depletion  Clavicle and Acromion Bone Region  Mild depletion  Scapular Bone Region  Mild depletion  Dorsal Hand  Moderate depletion  Patellar Region  Severe  depletion  Anterior Thigh Region  Severe depletion  Posterior Calf Region  Severe depletion  Edema (RD Assessment)  None  Hair  Reviewed  Eyes  Reviewed  Mouth  Reviewed  Skin  Reviewed  Nails  Reviewed     Diet Order:   Diet Order            Diet Carb Modified Fluid  consistency: Thin; Room service appropriate? Yes  Diet effective now             EDUCATION NEEDS:   Education needs have been addressed  Skin:  Skin Assessment: Skin Integrity Issues: Skin Integrity Issues:: Stage II, Unstageable Stage II: left buttocks (1cm x 1.7cm); left buttocks (1.2cm x 3cm) Unstageable: sacrum (3.8cm x 4.2cm)  Last BM:  Unknown  Height:   Ht Readings from Last 1 Encounters:  05/19/19 _0  (1.575 m)   Weight:   Wt Readings from Last 1 Encounters:  05/19/19 57.5 kg   BMI:  Body mass index is 23.19 kg/m.  Estimated Nutritional Needs:   Kcal:  1725-2000  Protein:  85-95 grams  Fluid:  1.7-2 L/day  Jacklynn Barnacle, MS, RD, LDN Pager number available on Amion

## 2019-05-20 NOTE — Consult Note (Signed)
Neurosurgery-New Consultation Evaluation 05/20/2019 Tiffaney Heimann [id number]  Identifying Statement: Alicia Andrade is a 59 y.o. female from [place] [address] with spine infection  Physician Requesting Consultation: Dr. Fran Lowes  History of Present Illness: Alicia Andrade is here for evaluation of multiple medical problems including an unhealed fracture of the femur, subacute cerebral infarct, and new discitis/osteomyelitis.  She has had some derangements in her electrolytes which are being treated.  She does note that she is been having frequent falls and is now more weak as she does appear deconditioned and with the known fracture.  She does endorse some recent back pain but does not endorse any pain radiating down her legs.  She has some numbness just on the dorsum of her right foot but denies numbness in the rest of her legs.  She has had some urinary incontinence for about a year but denies any stool incontinence.  Overall, she does feel that her legs are weaker but she also notes pain when trying to lift them at times.  She did get a MRI of the lumbar spine with contrast for evaluation.  Blood cultures were sent this morning.  Her white blood cell count was slightly elevated.  Past Medical History:  Past Medical History:  Diagnosis Date  . Bipolar 1 disorder (HCC)   . Diabetes mellitus without complication (HCC)   . Hypokalemia   . Hypothyroidism   . Major depressive disorder   . Rhabdomyolysis   . Thrombocytopenia (HCC)     Social History: Social History   Socioeconomic History  . Marital status: Single    Spouse name: Not on file  . Number of children: Not on file  . Years of education: Not on file  . Highest education level: Not on file  Occupational History  . Not on file  Tobacco Use  . Smoking status: Never Smoker  . Smokeless tobacco: Never Used  Substance and Sexual Activity  . Alcohol use: Not on file  . Drug use: Not on file  . Sexual activity: Not on file  Other  Topics Concern  . Not on file  Social History Narrative  . Not on file   Social Determinants of Health   Financial Resource Strain:   . Difficulty of Paying Living Expenses:   Food Insecurity:   . Worried About Programme researcher, broadcasting/film/video in the Last Year:   . Barista in the Last Year:   Transportation Needs:   . Freight forwarder (Medical):   Marland Kitchen Lack of Transportation (Non-Medical):   Physical Activity:   . Days of Exercise per Week:   . Minutes of Exercise per Session:   Stress:   . Feeling of Stress :   Social Connections:   . Frequency of Communication with Friends and Family:   . Frequency of Social Gatherings with Friends and Family:   . Attends Religious Services:   . Active Member of Clubs or Organizations:   . Attends Banker Meetings:   Marland Kitchen Marital Status:   Intimate Partner Violence:   . Fear of Current or Ex-Partner:   . Emotionally Abused:   Marland Kitchen Physically Abused:   . Sexually Abused:    Living arrangements (living alone, with partner): Her power of attorney is in the room  Family History: History reviewed. No pertinent family history.  Review of Systems:  Review of Systems - General ROS: Negative Psychological ROS: Negative Ophthalmic ROS: Negative ENT ROS: Negative Hematological and Lymphatic ROS: Negative  Endocrine ROS: Negative Respiratory ROS: Negative Cardiovascular ROS: Negative Gastrointestinal ROS: Negative Genito-Urinary ROS: Positive for chronic urinary incontinence Musculoskeletal ROS: Positive for back pain Neurological ROS: Positive for leg weakness Dermatological ROS: Negative  Physical Exam: BP (!) 153/78 (BP Location: Right Arm)   Pulse 95   Temp 98.8 F (37.1 C) (Oral)   Resp 18   Ht 5\' 2"  (1.575 m)   Wt 57.5 kg   SpO2 99%   BMI 23.19 kg/m  Body mass index is 23.19 kg/m. Body surface area is 1.59 meters squared. General appearance: Alert, cooperative, in no acute distress Head: Normocephalic,  atraumatic Eyes: Normal, EOM intact Oropharynx: Oropharynx is dry Ext: Wasting noted in all extremities, minor areas of skin healing noted in bilateral legs  Neurologic exam:  Mental status: alertness: alert but slightly blunted,  affect: normal Speech: fluent and clear Motor:strength in bilateral iliopsoas is 3 out of 5, 4 out of 5 in knee extension, 5 out of 5 in dorsi and plantar flexion Sensory: intact to light touch in bilateral lower extremities throughout with exception of some decrease over the dorsum of the right foot Reflexes: 1+ at bilateral patella Gait: Not tested given deconditioned state  Laboratory: Results for orders placed or performed during the hospital encounter of 05/19/19  SARS Coronavirus 2 by RT PCR (hospital order, performed in [facility] hospital lab) Nasopharyngeal Nasopharyngeal Swab   Specimen: Nasopharyngeal Swab  Result Value Ref Range   SARS Coronavirus 2 NEGATIVE NEGATIVE  CBC with Differential  Result Value Ref Range   WBC 12.7 (H) 4.0 - 10.5 K/uL   RBC 3.64 (L) 3.87 - 5.11 MIL/uL   Hemoglobin 11.0 (L) 12.0 - 15.0 g/dL   HCT [facility] (L) 25.0 - 53.9 %   MCV 88.7 80.0 - 100.0 fL   MCH 30.2 26.0 - 34.0 pg   MCHC 34.1 30.0 - 36.0 g/dL   RDW 76.7 34.1 - 93.7 %   Platelets 349 150 - 400 K/uL   nRBC 0.0 0.0 - 0.2 %   Neutrophils Relative % 90 %   Neutro Abs 11.5 (H) 1.7 - 7.7 K/uL   Lymphocytes Relative 6 %   Lymphs Abs 0.7 0.7 - 4.0 K/uL   Monocytes Relative 3 %   Monocytes Absolute 0.4 0.1 - 1.0 K/uL   Eosinophils Relative 0 %   Eosinophils Absolute 0.0 0.0 - 0.5 K/uL   Basophils Relative 0 %   Basophils Absolute 0.0 0.0 - 0.1 K/uL   Immature Granulocytes 1 %   Abs Immature Granulocytes 0.06 0.00 - 0.07 K/uL  Comprehensive metabolic panel  Result Value Ref Range   Sodium 134 (L) 135 - 145 mmol/L   Potassium 2.6 (LL) 3.5 - 5.1 mmol/L   Chloride 91 (L) 98 - 111 mmol/L   CO2 29 22 - 32 mmol/L   Glucose, Bld 242 (H) 70 - 99 mg/dL   BUN 36 (H)  6 - 20 mg/dL   Creatinine, Ser 90.2 0.44 - 1.00 mg/dL   Calcium 8.8 (L) 8.9 - 10.3 mg/dL   Total Protein 8.2 (H) 6.5 - 8.1 g/dL   Albumin 3.0 (L) 3.5 - 5.0 g/dL   AST 15 15 - 41 U/L   ALT 10 0 - 44 U/L   Alkaline Phosphatase 161 (H) 38 - 126 U/L   Total Bilirubin 1.1 0.3 - 1.2 mg/dL   GFR calc non Af Amer >60 >60 mL/min   GFR calc Af Amer >60 >60 mL/min   Anion gap  14 5 - 15  Protime-INR  Result Value Ref Range   Prothrombin Time 14.2 11.4 - 15.2 seconds   INR 1.1 0.8 - 1.2  APTT  Result Value Ref Range   aPTT 28 24 - 36 seconds  Magnesium  Result Value Ref Range   Magnesium 1.7 1.7 - 2.4 mg/dL  Hemoglobin A1c  Result Value Ref Range   Hgb A1c MFr Bld 8.8 (H) 4.8 - 5.6 %   Mean Plasma Glucose 205.86 mg/dL  Lipid panel  Result Value Ref Range   Cholesterol 174 0 - 200 mg/dL   Triglycerides 140 <150 mg/dL   HDL 37 (L) >40 mg/dL   Total CHOL/HDL Ratio 4.7 RATIO   VLDL 28 0 - 40 mg/dL   LDL Cholesterol 109 (H) 0 - 99 mg/dL  Basic metabolic panel  Result Value Ref Range   Sodium 135 135 - 145 mmol/L   Potassium 2.9 (L) 3.5 - 5.1 mmol/L   Chloride 93 (L) 98 - 111 mmol/L   CO2 29 22 - 32 mmol/L   Glucose, Bld 184 (H) 70 - 99 mg/dL   BUN 29 (H) 6 - 20 mg/dL   Creatinine, Ser 0.40 (L) 0.44 - 1.00 mg/dL   Calcium 8.5 (L) 8.9 - 10.3 mg/dL   GFR calc non Af Amer >60 >60 mL/min   GFR calc Af Amer >60 >60 mL/min   Anion gap 13 5 - 15  Basic metabolic panel  Result Value Ref Range   Sodium 136 135 - 145 mmol/L   Potassium 2.9 (L) 3.5 - 5.1 mmol/L   Chloride 95 (L) 98 - 111 mmol/L   CO2 31 22 - 32 mmol/L   Glucose, Bld 257 (H) 70 - 99 mg/dL   BUN 27 (H) 6 - 20 mg/dL   Creatinine, Ser 0.43 (L) 0.44 - 1.00 mg/dL   Calcium 8.4 (L) 8.9 - 10.3 mg/dL   GFR calc non Af Amer >60 >60 mL/min   GFR calc Af Amer >60 >60 mL/min   Anion gap 10 5 - 15  CBC with Differential/Platelet  Result Value Ref Range   WBC 14.1 (H) 4.0 - 10.5 K/uL   RBC 3.36 (L) 3.87 - 5.11 MIL/uL   Hemoglobin  9.9 (L) 12.0 - 15.0 g/dL   HCT 28.9 (L) 36.0 - 46.0 %   MCV 86.0 80.0 - 100.0 fL   MCH 29.5 26.0 - 34.0 pg   MCHC 34.3 30.0 - 36.0 g/dL   RDW 14.3 11.5 - 15.5 %   Platelets 327 150 - 400 K/uL   nRBC 0.0 0.0 - 0.2 %   Neutrophils Relative % 86 %   Neutro Abs 12.0 (H) 1.7 - 7.7 K/uL   Lymphocytes Relative 9 %   Lymphs Abs 1.2 0.7 - 4.0 K/uL   Monocytes Relative 5 %   Monocytes Absolute 0.7 0.1 - 1.0 K/uL   Eosinophils Relative 0 %   Eosinophils Absolute 0.0 0.0 - 0.5 K/uL   Basophils Relative 0 %   Basophils Absolute 0.0 0.0 - 0.1 K/uL   Immature Granulocytes 0 %   Abs Immature Granulocytes 0.06 0.00 - 0.07 K/uL  Glucose, capillary  Result Value Ref Range   Glucose-Capillary 281 (H) 70 - 99 mg/dL  Glucose, capillary  Result Value Ref Range   Glucose-Capillary 223 (H) 70 - 99 mg/dL  Magnesium  Result Value Ref Range   Magnesium 1.4 (L) 1.7 - 2.4 mg/dL  Glucose, capillary  Result Value Ref Range  Glucose-Capillary 187 (H) 70 - 99 mg/dL   Comment 1 Notify RN   ECHOCARDIOGRAM COMPLETE  Result Value Ref Range   Weight 2,028.23 oz   Height 62 in   BP 153/78 mmHg  Type and screen [facility]  Result Value Ref Range   ABO/RH(D) O POS    Antibody Screen NEG    Sample Expiration      05/22/2019,2359 Performed at [facility], [address] [place], [place] [address]   Troponin I (High Sensitivity)  Result Value Ref Range   Troponin I (High Sensitivity) 5 <18 ng/L  Troponin I (High Sensitivity)  Result Value Ref Range   Troponin I (High Sensitivity) 5 <18 ng/L   I personally reviewed labs  Imaging: MRI lumbar spine with contrast: Paraspinal and other soft tissues: Bilateral psoas muscle fluid collections most consistent with abscesses. Psoas hematomas could have a similar appearance in a different setting.  Disc levels:  L1-2: Disc bulge.  No compressive stenosis.  L2-3: Endplate osteophytes and bulging of the disc. Mild  facet hypertrophy. Moderate multifactorial stenosis.  L3-4: As noted above, fluid intensity material filling the disc space. Edema of the L3 and L4 vertebral bodies. Epidural edema and enhancement. 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  L4-5: Spondylosis with endplate osteophytes and chronic disc protrusion. Moderate multifactorial stenosis that could cause neural compression.  L5-S1: Endplate osteophytes and bulging of the disc. No central canal stenosis. Moderate left foraminal stenosis.  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   Impression/Plan:  Alicia Andrade is here for evaluation of new findings of L3-4 discitis and osteomyelitis.  There does appear to be also evidence of bilateral psoas abscesses.  Given this finding, I do think this can cause weakness in hip flexion and recommend consulting radiology to see if they can place a drain.  She will need positive cultures to guide antibiotics for treatment and if blood cultures are negative, do recommend biopsy of the psoas or the disc by radiology in order to guide treatment.  There is no large epidural collection at this time that appears to be causing any symptoms that she has chronic urinary issues and no other focal signs of weakness.  Open surgery at this time is not recommended as it can lead to worsened infection and wound healing issues.  Recommend medical management of her current infection   1.  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  Plan -Recommend blood cultures and IR for biopsy if needed in order to guide antibiotic management -Patient can be offered a LSO brace to use when upright if having considerable amount of back pain.

## 2019-05-20 NOTE — Progress Notes (Signed)
CH visited pt. per OR for AD completion/education.  Pt. lying down in bed w/sister Lanora Manis @ bedside. Pt. said she suffered a fall in April that required hospitalization in [place]; sister said hospital experience there was very poor --> grateful to have pt. at [facility]. Sister said she is MPOA for pt.; shared she gave paperwork to ED staff for scanning into chart; CH notes no record of AD present in EPIC or paper chart --> borrowed original copy of HCPOA and Desire for Natural Death from sister to add to chart and submit to medical records for scanning.  Because pt.'s documents are different in format from usual [facility] form, CH discussed contents w/pt. and sister.  Sister initially stated pt. is DNR; pt. full code acc. to chart; when [facility] attempted to explore this further w/pt. and sister, pt. said she does not  want to be DNR @ this time.  Pt. does, however, want life prolonging treatment discontinued if her condition becomes 'terminal and incurable' and if she enters into what physicians determine to be a 'persistent vegetative state' according to Declaration of a Desire for a Natural Death.  (see scanned copy in ACP/Vynca)  At this time, pt. and sister express no further needs, but sister very grateful for [facility] visit.    05/20/19 1300  Clinical Encounter Type  Visited With Patient and family together  Visit Type Initial;Psychological support;Social support (Scientist, water quality education)  Referral From Nurse

## 2019-05-20 NOTE — Progress Notes (Signed)
OT Cancellation Note  Patient Details Name: Courtni Balash MRN: [id number] DOB: [birth year]   Cancelled Treatment:    Reason Eval/Treat Not Completed: Medical issues which prohibited therapy  OT consult received and chart reviewed. After consulting with RN and physician r/t multiple potential hindrances/considerations r/t  mobilization, Dr. Gerri Lins opted to hold off on therapy at this time, thus removing order. Please re-consult should this patient become appropriate. Thank you.  Rejeana Brock, MS, OTR/L ascom [phone] 05/20/19, 9:52 AM

## 2019-05-20 NOTE — Consult Note (Signed)
WOC Nurse Consult Note: Reason for Consult: pressure injury Wound type: Unstageable Pressure injury; sacrum Pressure Injury POA: Yes Measurement: 4cm x 3cm x 0.2cm  Wound bed:70% pink/30% yellow/grey slough Drainage (amount, consistency, odor) moderate, no odor Periwound: intact  Dressing procedure/placement/frequency: Add enzymatic debridement ointment daily. Top with saline moist gauze and dry dressing. Change daily.  Add air mattress for pressure redistribution.  Patient with pressure injury and fractures on both the left and right LE.   Discussed POC with patient and bedside nurse.  Re consult if needed, will not follow at this time. Thanks  Jackilyn Umphlett [employer], RN,CWOCN, CNS, CWON-AP [phone])

## 2019-05-20 NOTE — ACP (Advance Care Planning) (Signed)
Because pt.'s documents are different in format from usual [facility] form, CH discussed contents w/pt. and sister.  Sister initially stated pt. is DNR; pt. full code acc. to chart; when [facility] attempted to explore this further w/pt. and sister, pt. said she does not  want to be DNR @ this time.  Pt. does, however, want life prolonging treatment discontinued if her condition becomes 'terminal and incurable' and if she enters into what physicians determine to be a 'persistent vegetative state' according to Declaration of a Desire for a Natural Death.

## 2019-05-21 ENCOUNTER — Encounter: Payer: Self-pay | Admitting: Internal Medicine

## 2019-05-21 ENCOUNTER — Inpatient Hospital Stay: Payer: Commercial Managed Care - PPO

## 2019-05-21 DIAGNOSIS — R404 Transient alteration of awareness: Secondary | ICD-10-CM

## 2019-05-21 LAB — PROTEIN ELECTROPHORESIS, SERUM
A/G Ratio: 0.5 — ABNORMAL LOW (ref 0.7–1.7)
Albumin ELP: 2.2 g/dL — ABNORMAL LOW (ref 2.9–4.4)
Alpha-1-Globulin: 0.4 g/dL (ref 0.0–0.4)
Alpha-2-Globulin: 1 g/dL (ref 0.4–1.0)
Beta Globulin: 1.4 g/dL — ABNORMAL HIGH (ref 0.7–1.3)
Gamma Globulin: 1.3 g/dL (ref 0.4–1.8)
Globulin, Total: 4.1 g/dL — ABNORMAL HIGH (ref 2.2–3.9)
Total Protein ELP: 6.3 g/dL (ref 6.0–8.5)

## 2019-05-21 LAB — CBC WITH DIFFERENTIAL/PLATELET
Abs Immature Granulocytes: 0.04 10*3/uL (ref 0.00–0.07)
Basophils Absolute: 0.1 10*3/uL (ref 0.0–0.1)
Basophils Relative: 0 %
Eosinophils Absolute: 0.1 10*3/uL (ref 0.0–0.5)
Eosinophils Relative: 0 %
HCT: 30.9 % — ABNORMAL LOW (ref 36.0–46.0)
Hemoglobin: 10.2 g/dL — ABNORMAL LOW (ref 12.0–15.0)
Immature Granulocytes: 0 %
Lymphocytes Relative: 11 %
Lymphs Abs: 1.2 10*3/uL (ref 0.7–4.0)
MCH: 29.3 pg (ref 26.0–34.0)
MCHC: 33 g/dL (ref 30.0–36.0)
MCV: 88.8 fL (ref 80.0–100.0)
Monocytes Absolute: 0.6 10*3/uL (ref 0.1–1.0)
Monocytes Relative: 5 %
Neutro Abs: 9.6 10*3/uL — ABNORMAL HIGH (ref 1.7–7.7)
Neutrophils Relative %: 84 %
Platelets: 382 10*3/uL (ref 150–400)
RBC: 3.48 MIL/uL — ABNORMAL LOW (ref 3.87–5.11)
RDW: 14.4 % (ref 11.5–15.5)
WBC: 11.5 10*3/uL — ABNORMAL HIGH (ref 4.0–10.5)
nRBC: 0 % (ref 0.0–0.2)

## 2019-05-21 LAB — BASIC METABOLIC PANEL
Anion gap: 10 (ref 5–15)
BUN: 12 mg/dL (ref 6–20)
CO2: 30 mmol/L (ref 22–32)
Calcium: 8.5 mg/dL — ABNORMAL LOW (ref 8.9–10.3)
Chloride: 96 mmol/L — ABNORMAL LOW (ref 98–111)
Creatinine, Ser: <0.3 mg/dL — ABNORMAL LOW (ref 0.44–1.00)
Glucose, Bld: 168 mg/dL — ABNORMAL HIGH (ref 70–99)
Potassium: 3.2 mmol/L — ABNORMAL LOW (ref 3.5–5.1)
Sodium: 136 mmol/L (ref 135–145)

## 2019-05-21 LAB — HEPATITIS PANEL, ACUTE
HCV Ab: 0.1 s/co ratio — AB (ref 0.0–0.9)
Hep A IgM: NEGATIVE — AB
Hep B C IgM: NEGATIVE — AB
Hepatitis B Surface Ag: NEGATIVE — AB

## 2019-05-21 LAB — MAGNESIUM: Magnesium: 1.9 mg/dL (ref 1.7–2.4)

## 2019-05-21 LAB — HIV ANTIBODY (ROUTINE TESTING W REFLEX)
HIV Screen 4th Generation wRfx: NONREACTIVE
HIV Screen 4th Generation wRfx: NONREACTIVE

## 2019-05-21 LAB — GLUCOSE, CAPILLARY
Glucose-Capillary: 176 mg/dL — ABNORMAL HIGH (ref 70–99)
Glucose-Capillary: 181 mg/dL — ABNORMAL HIGH (ref 70–99)
Glucose-Capillary: 184 mg/dL — ABNORMAL HIGH (ref 70–99)
Glucose-Capillary: 192 mg/dL — ABNORMAL HIGH (ref 70–99)

## 2019-05-21 LAB — C-REACTIVE PROTEIN: CRP: 5.4 mg/dL — ABNORMAL HIGH (ref <1.0)

## 2019-05-21 LAB — SEDIMENTATION RATE: Sed Rate: 126 mm/hr — ABNORMAL HIGH (ref 0–30)

## 2019-05-21 LAB — CORTISOL-AM, BLOOD: Cortisol - AM: 27.5 ug/dL — ABNORMAL HIGH (ref 6.7–22.6)

## 2019-05-21 IMAGING — CT CT GUIDANCE NEEDLE PLACEMENT
1 of 2 series · 14 of 32 positions shown, 19 images · non-contrast
Comparison: none

INDICATION: LUMBAR SPINE OSTEO MYELITIS/DISCITIS, SMALL PARASPINOUS ABSCESSES

[Series 2: i-spiral 5.0 b30f · axial · 0.84mm/px · z∈[-244,-58]mm · 14 of 59 slices shown, 19 images]
[im 3/59  soft-tissue]
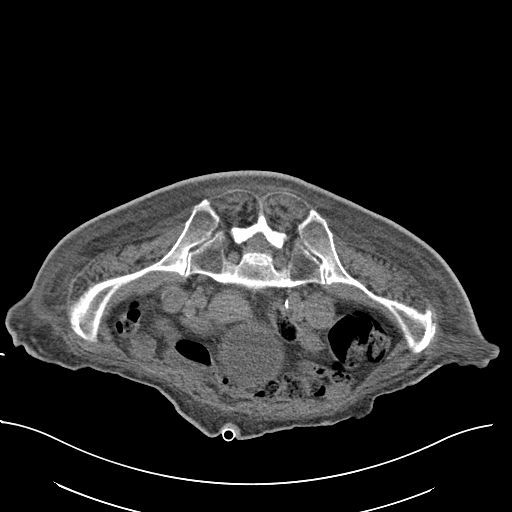
[im 3/59  bone]
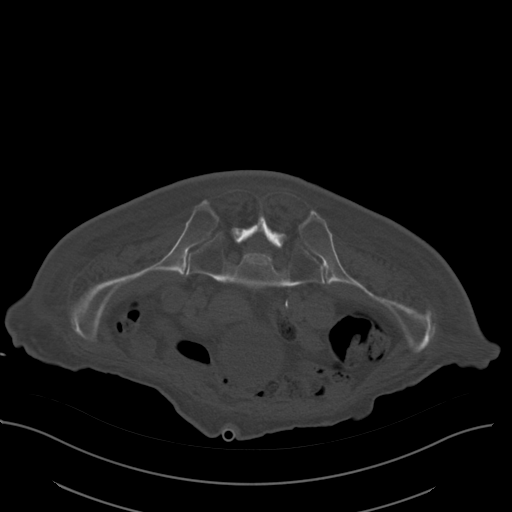
[im 9/59  soft-tissue]
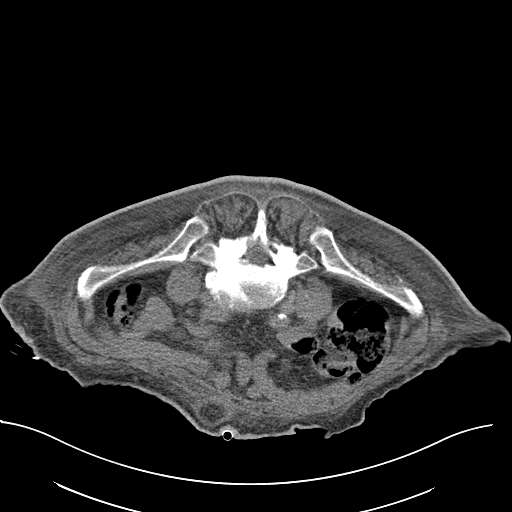
[im 12/59  soft-tissue]
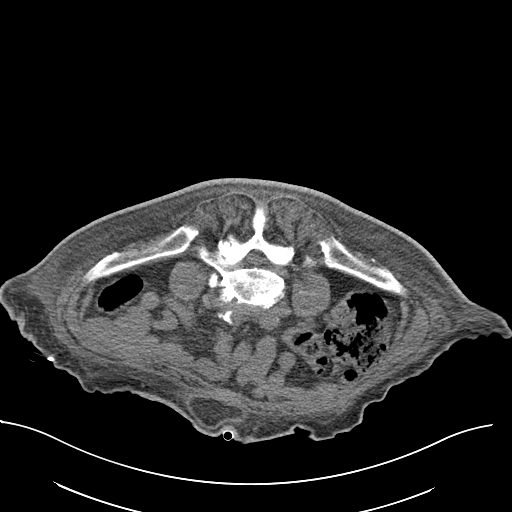
[im 17/59  soft-tissue]
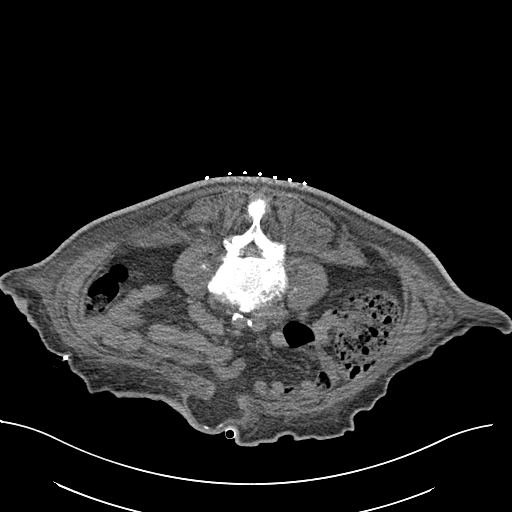
[im 20/59  soft-tissue]
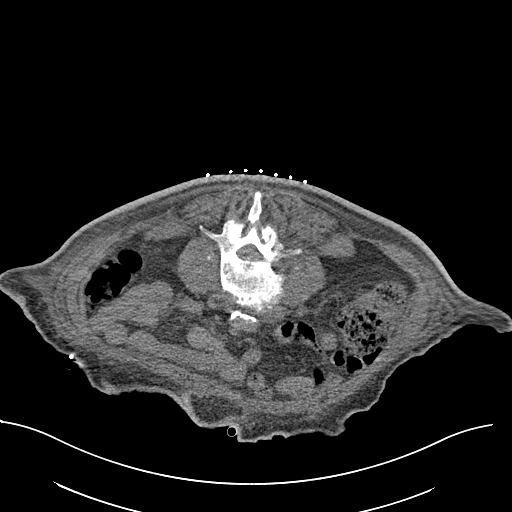
[im 25/59  soft-tissue]
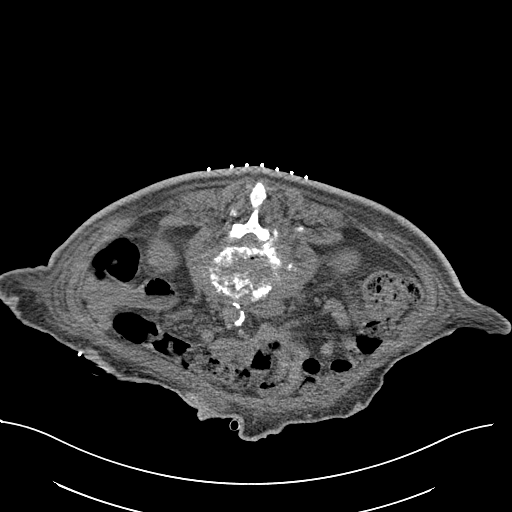
[im 31/59  soft-tissue]
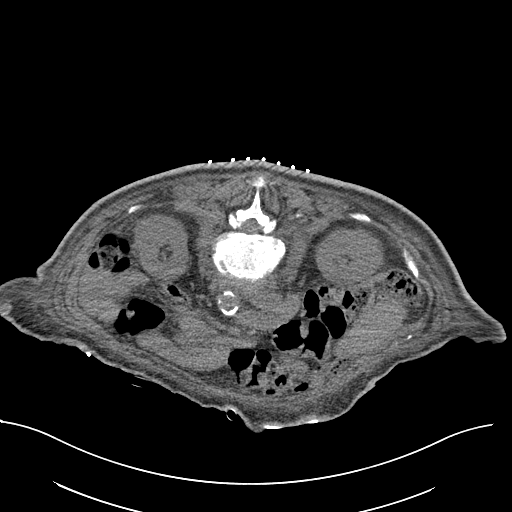
[im 34/59  soft-tissue]
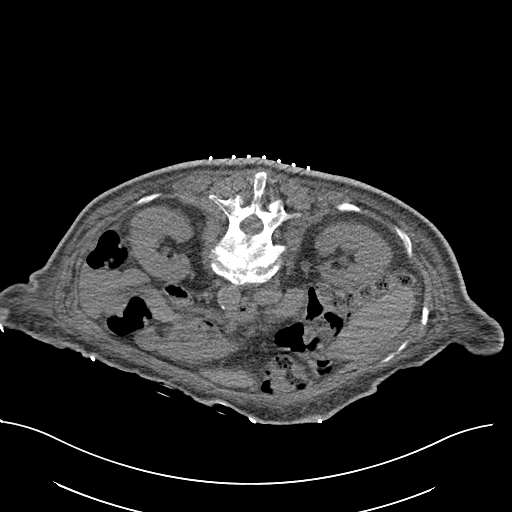
[im 39/59  soft-tissue]
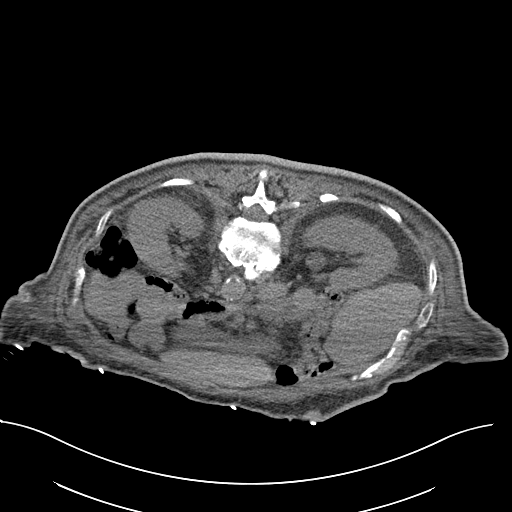
[im 39/59  bone]
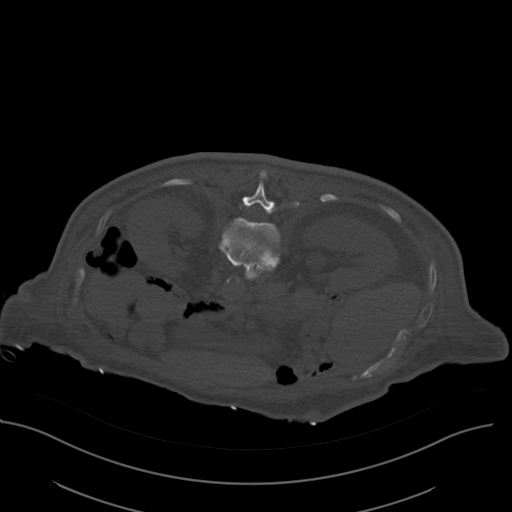
[im 42/59  soft-tissue]
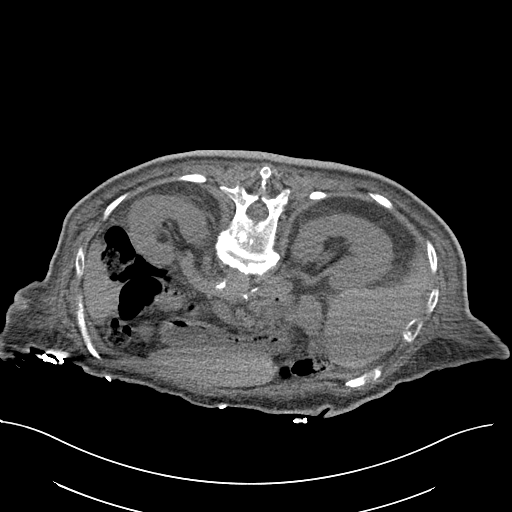
[im 47/59  soft-tissue]
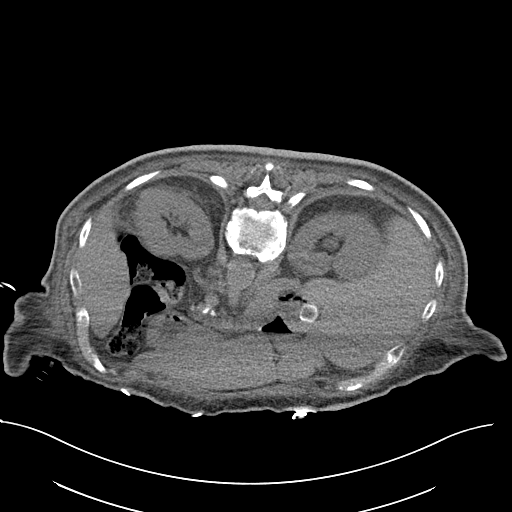
[im 47/59  lung]
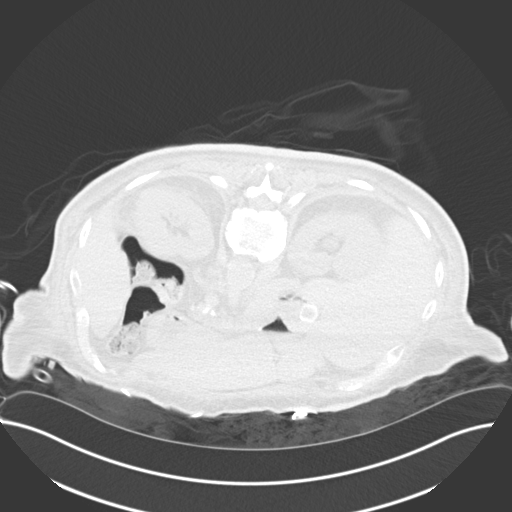
[im 50/59  soft-tissue]
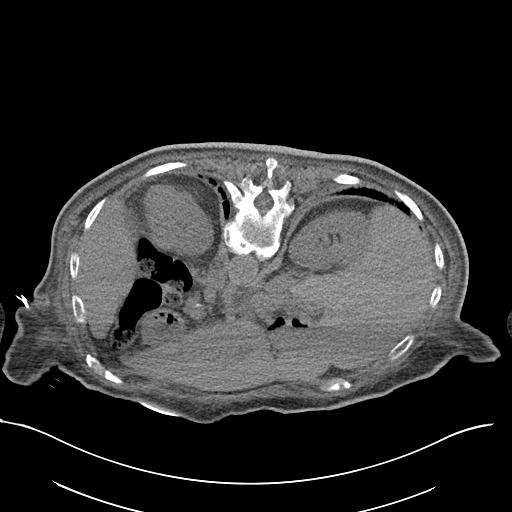
[im 50/59  lung]
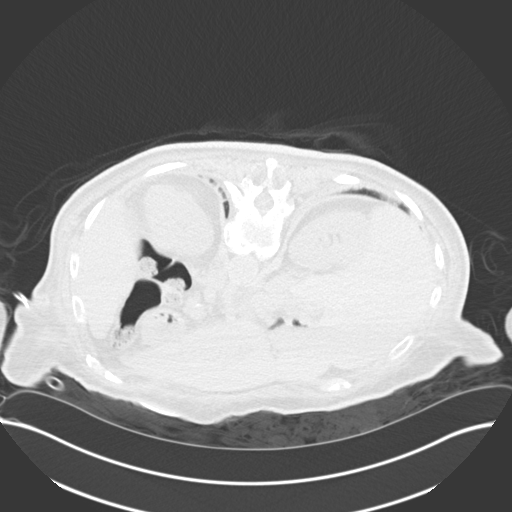
[im 53/59  lung]
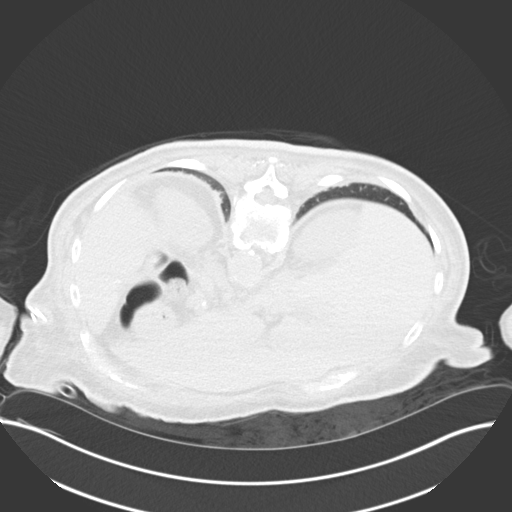
[im 56/59  soft-tissue]
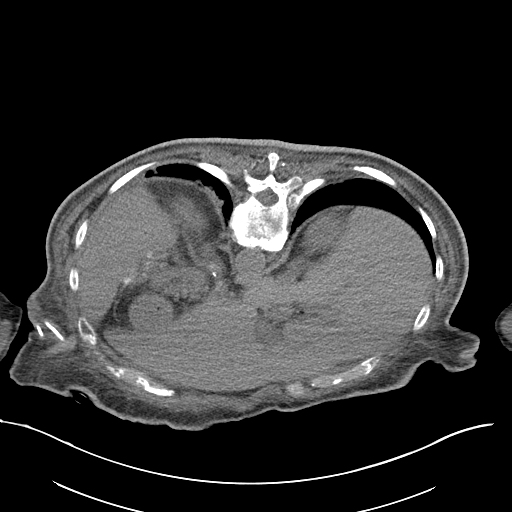
[im 56/59  lung]
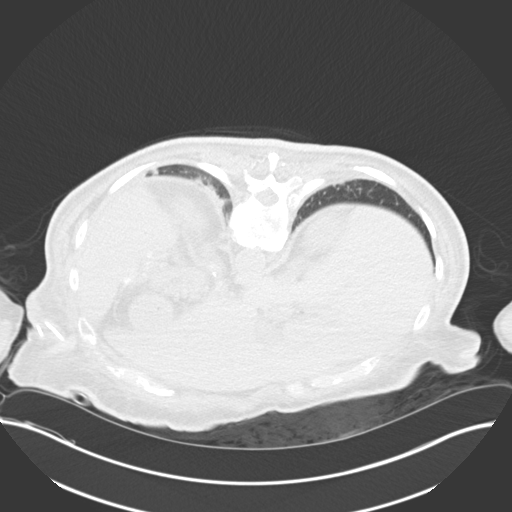

[14 of 32 positions shown; findings below may reference images not displayed]

EXAM:
CT ASPIRATION LEFT L3-4 PARASPINOUS ABSCESS

CT ASPIRATION RIGHT L3-4 PARASPINOUS ABSCESS

MEDICATIONS:
The patient is currently admitted to the hospital and receiving
intravenous antibiotics. The antibiotics were administered within an
appropriate time frame prior to the initiation of the procedure.

ANESTHESIA/SEDATION:
Fentanyl 25 mcg IV; Versed 0.5 mg IV

Moderate Sedation Time:  15 MINUTES

The patient was continuously monitored during the procedure by the
interventional radiology nurse under my direct supervision.

COMPLICATIONS:
None immediate.

PROCEDURE:
Informed written consent was obtained from the patient's power of
attorney after a thorough discussion of the procedural risks,
benefits and alternatives. All questions were addressed. Maximal
Sterile Barrier Technique was utilized including caps, mask, sterile
gowns, sterile gloves, sterile drape, hand hygiene and skin
antiseptic. A timeout was performed prior to the initiation of the
procedure.

Previous imaging reviewed. Patient position prone. Noncontrast
localization CT performed. The bilateral small L3-4 level
paraspinous abscesses at the level of the L3-4 destructive spondylo
discitis were localized and marked.

CT aspiration right paraspinous abscess: Under sterile conditions
and local anesthesia, an 18 gauge 10 cm access needle was advanced
into the right paraspinous abscess. Needle position confirmed with
CT. Syringe aspiration yielded only scant bloody fluid. Sample sent
for culture. Needle removed.

CT aspiration left paraspinous abscess: Also under sterile
conditions and local anesthesia, an 18 gauge 10 cm access needle was
advanced from a posterior approach to the left paraspinous abscess.
Needle position confirmed with CT. Syringe aspiration yielded scant
exudative fluid sample sent for culture. Needle removed.
Postprocedure imaging demonstrates no hemorrhage or hematoma.

Patient tolerated the aspirations well.
IMPRESSION: Successful CT aspirations of the bilateral paraspinous abscesses at
the L3-4 spondylo discitis.

## 2019-05-21 IMAGING — CT CT GUIDANCE NEEDLE PLACEMENT
1 of 2 series · 14 of 32 positions shown, 19 images · non-contrast
Comparison: none

INDICATION: LUMBAR SPINE OSTEO MYELITIS/DISCITIS, SMALL PARASPINOUS ABSCESSES

[Series 2: i-spiral 5.0 b30f · axial · 0.84mm/px · z∈[-244,-58]mm · 14 of 59 slices shown, 19 images]
[im 3/59  soft-tissue]
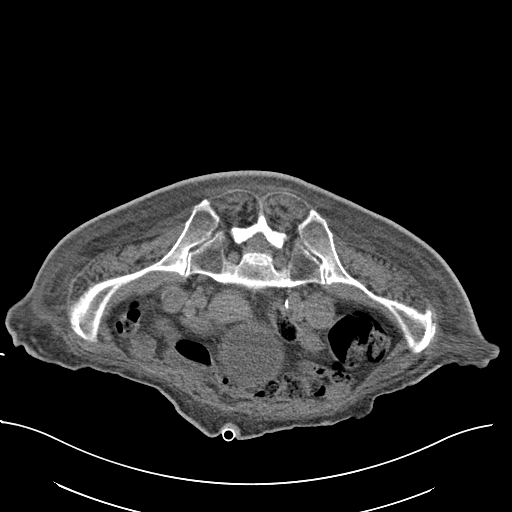
[im 3/59  bone]
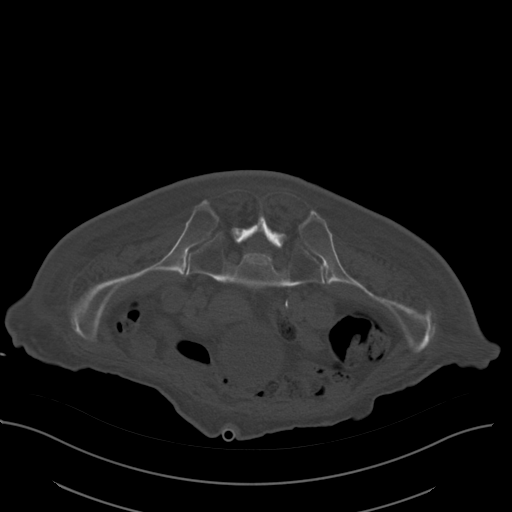
[im 9/59  soft-tissue]
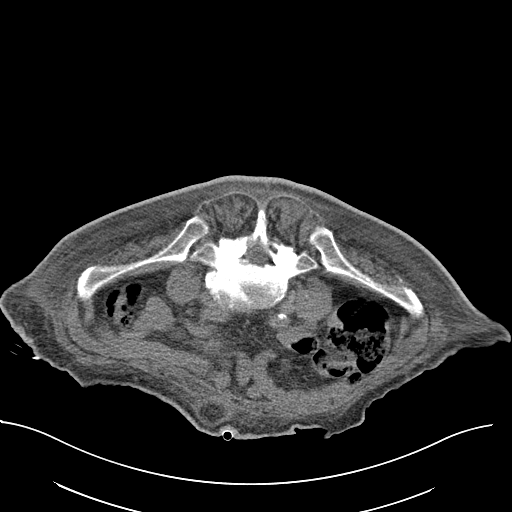
[im 12/59  soft-tissue]
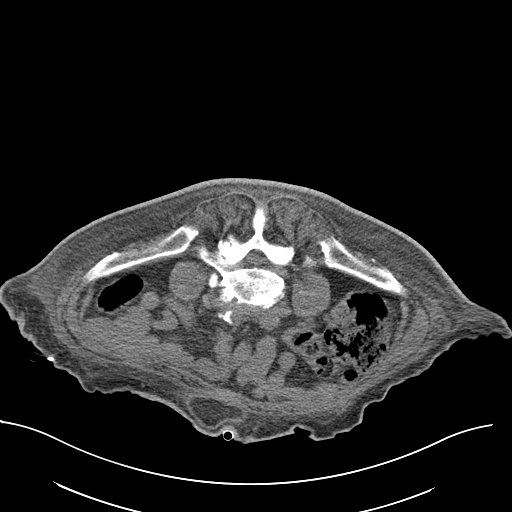
[im 17/59  soft-tissue]
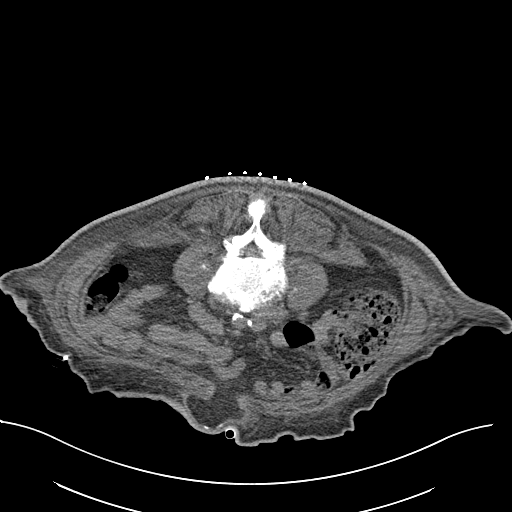
[im 20/59  soft-tissue]
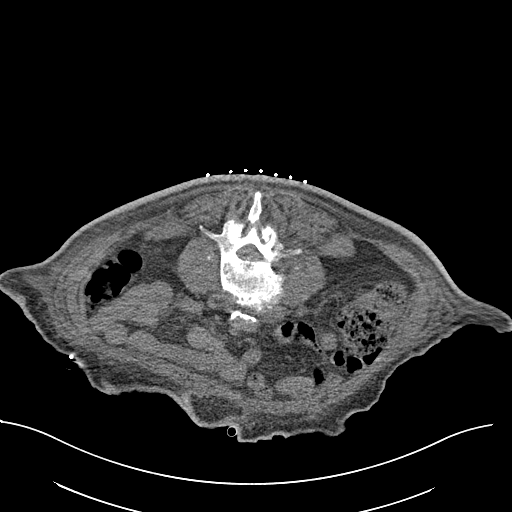
[im 25/59  soft-tissue]
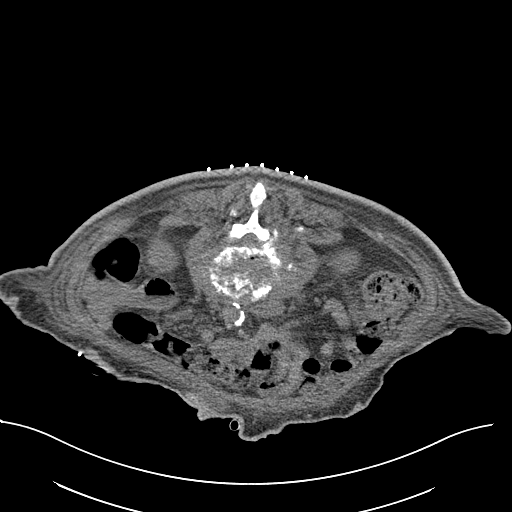
[im 31/59  soft-tissue]
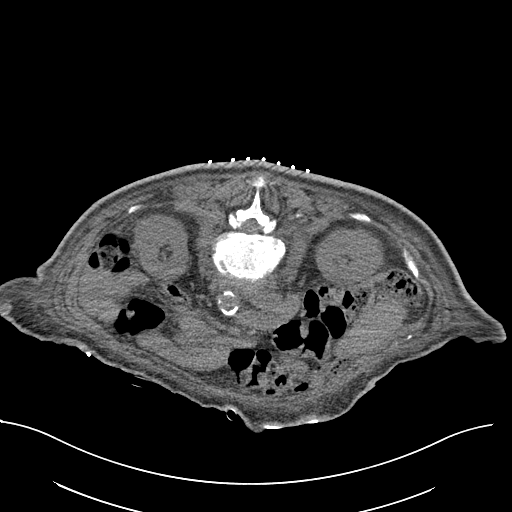
[im 34/59  soft-tissue]
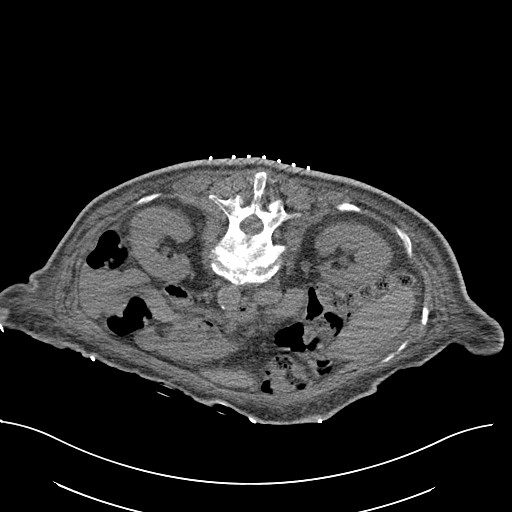
[im 39/59  soft-tissue]
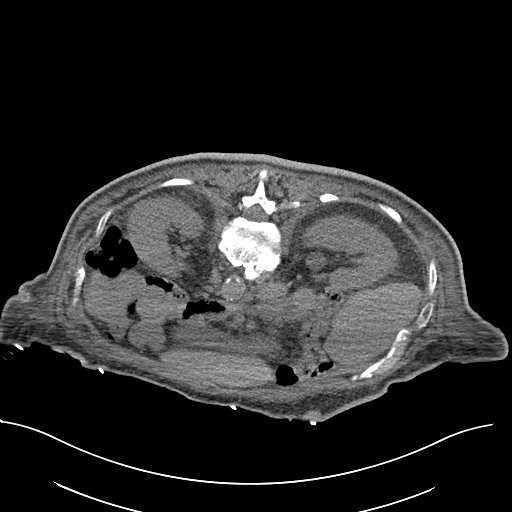
[im 39/59  bone]
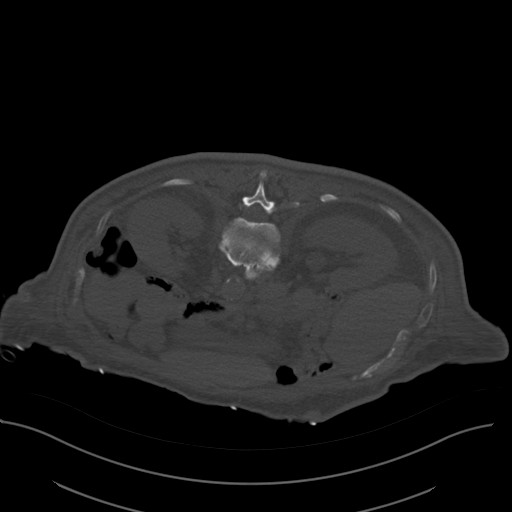
[im 42/59  soft-tissue]
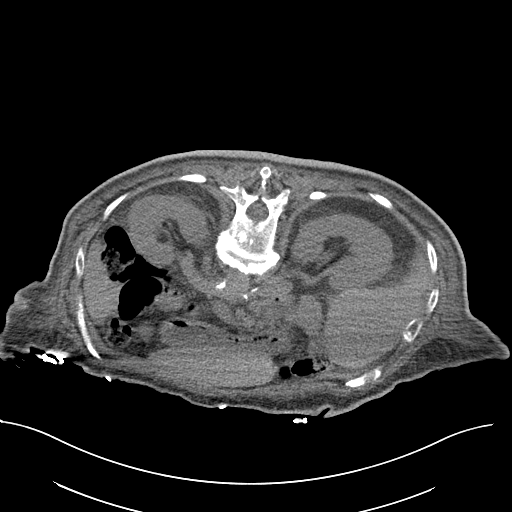
[im 47/59  soft-tissue]
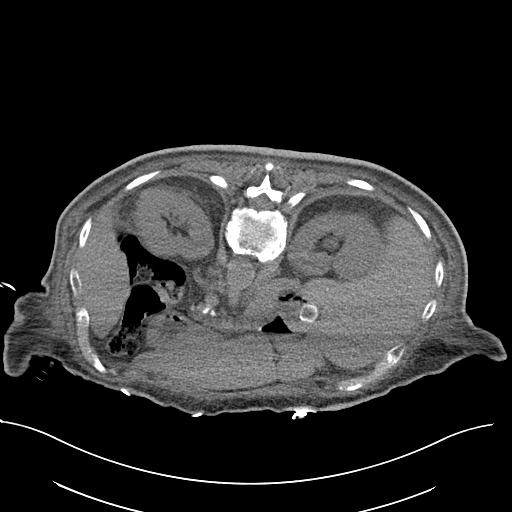
[im 47/59  lung]
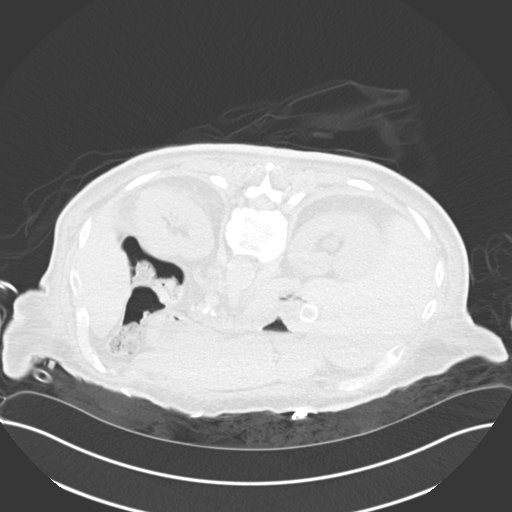
[im 50/59  soft-tissue]
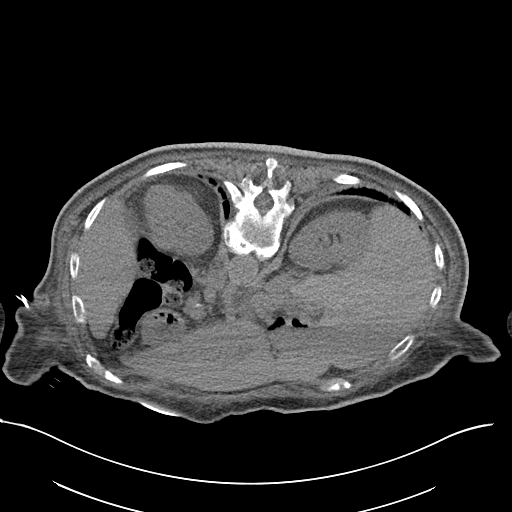
[im 50/59  lung]
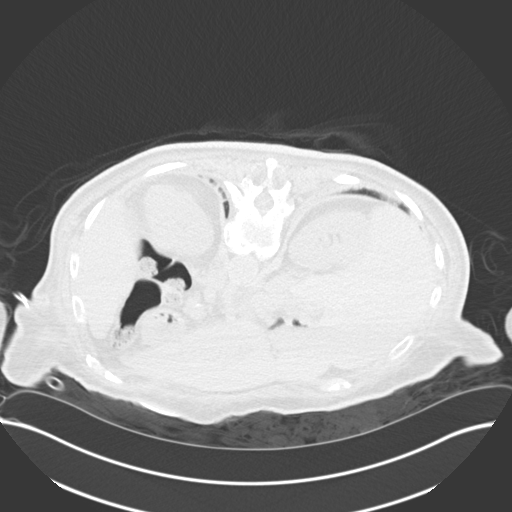
[im 53/59  lung]
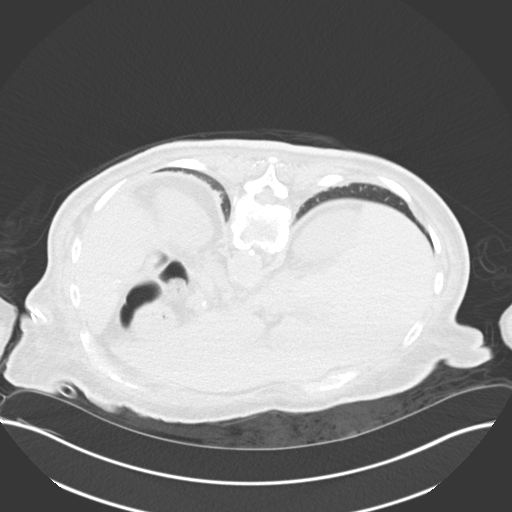
[im 56/59  soft-tissue]
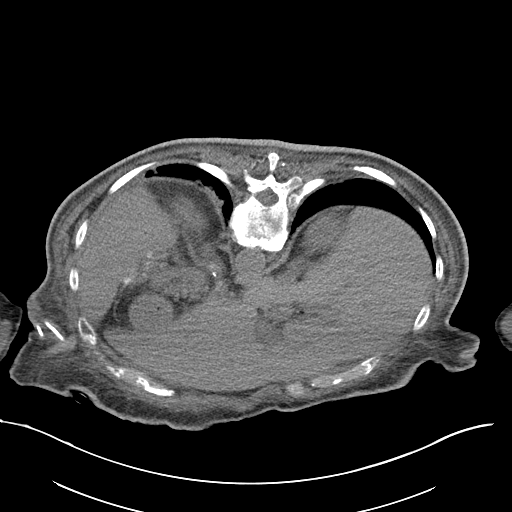
[im 56/59  lung]
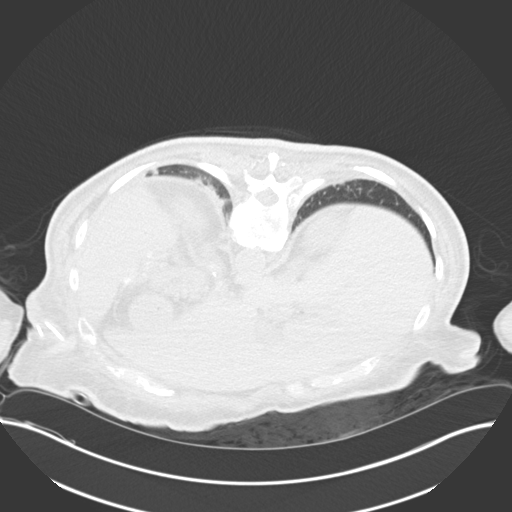

[14 of 32 positions shown; findings below may reference images not displayed]

EXAM:
CT ASPIRATION LEFT L3-4 PARASPINOUS ABSCESS

CT ASPIRATION RIGHT L3-4 PARASPINOUS ABSCESS

MEDICATIONS:
The patient is currently admitted to the hospital and receiving
intravenous antibiotics. The antibiotics were administered within an
appropriate time frame prior to the initiation of the procedure.

ANESTHESIA/SEDATION:
Fentanyl 25 mcg IV; Versed 0.5 mg IV

Moderate Sedation Time:  15 MINUTES

The patient was continuously monitored during the procedure by the
interventional radiology nurse under my direct supervision.

COMPLICATIONS:
None immediate.

PROCEDURE:
Informed written consent was obtained from the patient's power of
attorney after a thorough discussion of the procedural risks,
benefits and alternatives. All questions were addressed. Maximal
Sterile Barrier Technique was utilized including caps, mask, sterile
gowns, sterile gloves, sterile drape, hand hygiene and skin
antiseptic. A timeout was performed prior to the initiation of the
procedure.

Previous imaging reviewed. Patient position prone. Noncontrast
localization CT performed. The bilateral small L3-4 level
paraspinous abscesses at the level of the L3-4 destructive spondylo
discitis were localized and marked.

CT aspiration right paraspinous abscess: Under sterile conditions
and local anesthesia, an 18 gauge 10 cm access needle was advanced
into the right paraspinous abscess. Needle position confirmed with
CT. Syringe aspiration yielded only scant bloody fluid. Sample sent
for culture. Needle removed.

CT aspiration left paraspinous abscess: Also under sterile
conditions and local anesthesia, an 18 gauge 10 cm access needle was
advanced from a posterior approach to the left paraspinous abscess.
Needle position confirmed with CT. Syringe aspiration yielded scant
exudative fluid sample sent for culture. Needle removed.
Postprocedure imaging demonstrates no hemorrhage or hematoma.

Patient tolerated the aspirations well.
IMPRESSION: Successful CT aspirations of the bilateral paraspinous abscesses at
the L3-4 spondylo discitis.

## 2019-05-21 MED ORDER — FENTANYL CITRATE (PF) 100 MCG/2ML IJ SOLN
INTRAMUSCULAR | Status: AC | PRN
  Administered 2019-05-21: 25 ug via INTRAVENOUS

## 2019-05-21 MED ORDER — MIDAZOLAM HCL 5 MG/5ML IJ SOLN
INTRAMUSCULAR | Status: AC | PRN
  Administered 2019-05-21: 0.5 mg via INTRAVENOUS

## 2019-05-21 MED ORDER — MIDAZOLAM HCL 5 MG/5ML IJ SOLN
INTRAMUSCULAR | Status: AC
  Filled 2019-05-21: qty 5

## 2019-05-21 MED ORDER — SODIUM CHLORIDE 0.9 % IV SOLN
2.0000 g | Freq: Three times a day (TID) | INTRAVENOUS | Status: DC
  Filled 2019-05-21 (×2): qty 2

## 2019-05-21 MED ORDER — VANCOMYCIN HCL 1250 MG/250ML IV SOLN
1250.0000 mg | Freq: Once | INTRAVENOUS | Status: DC
  Filled 2019-05-21: qty 250

## 2019-05-21 MED ORDER — FENTANYL CITRATE (PF) 100 MCG/2ML IJ SOLN
INTRAMUSCULAR | Status: AC
  Filled 2019-05-21: qty 2

## 2019-05-21 MED ORDER — POTASSIUM CHLORIDE CRYS ER 20 MEQ PO TBCR
40.0000 meq | EXTENDED_RELEASE_TABLET | Freq: Once | ORAL | Status: AC
  Administered 2019-05-21: 40 meq via ORAL
  Filled 2019-05-21: qty 2

## 2019-05-21 MED ORDER — VANCOMYCIN HCL 750 MG/150ML IV SOLN
750.0000 mg | Freq: Two times a day (BID) | INTRAVENOUS | Status: DC
  Filled 2019-05-21: qty 150

## 2019-05-21 MED ORDER — POTASSIUM CHLORIDE 10 MEQ/100ML IV SOLN
10.0000 meq | Freq: Once | INTRAVENOUS | Status: AC
  Administered 2019-05-21: 13:00:00 10 meq via INTRAVENOUS
  Filled 2019-05-21: qty 100

## 2019-05-21 MED ORDER — POTASSIUM CHLORIDE 10 MEQ/100ML IV SOLN
10.0000 meq | INTRAVENOUS | Status: DC
  Administered 2019-05-21 (×2): 10 meq via INTRAVENOUS
  Filled 2019-05-21: qty 100

## 2019-05-21 MED ORDER — HYDROMORPHONE HCL 1 MG/ML IJ SOLN
0.5000 mg | Freq: Once | INTRAMUSCULAR | Status: AC
  Administered 2019-05-21: 07:00:00 0.5 mg via INTRAVENOUS
  Filled 2019-05-21: qty 1

## 2019-05-21 NOTE — Progress Notes (Signed)
Date of Admission:  05/19/2019      Subjective: Pt underwent aspiration of the psoas fluid today Says she did not sleep well last night because of back pain  Medications:  . ascorbic acid  500 mg Oral BID  . aspirin EC  325 mg Oral Daily  . collagenase   Topical Daily  . fentaNYL      . gabapentin  300 mg Oral TID  . insulin aspart  0-15 Units Subcutaneous TID WC  . insulin glargine  15 Units Subcutaneous Daily  . lamoTRIgine  100 mg Oral BID  . levothyroxine  125 mcg Oral Daily  . midazolam      . multivitamin with minerals  1 tablet Oral Daily  . mupirocin ointment  1 application Nasal BID  . pneumococcal 23 valent vaccine  0.5 mL Intramuscular Tomorrow-1000  . Ensure Max Protein  11 oz Oral BID BM  . simvastatin  10 mg Oral q1800  . venlafaxine XR  150 mg Oral Q breakfast    Objective: Vital signs in last 24 hours: Temp:  [97.1 F (36.2 C)-98.8 F (37.1 C)] 97.1 F (36.2 C) (05/12 1053) Pulse Rate:  [92-105] 96 (05/12 1053) Resp:  [10-22] 16 (05/12 1053) BP: (106-175)/(60-90) 146/78 (05/12 1053) SpO2:  [98 %-100 %] 100 % (05/12 1015)  PHYSICAL EXAM:  General: Alert, cooperative, chronically ill, temporal wasting Lungs:b/l air entry Heart: s1s2 Abdomen: Soft, non-tender,not distended. Bowel sounds normal. Umbilical hernia Extremities: b/l bony knee enlargement Skin: scratch marks hin Lymph: Cervical, supraclavicular normal. Neurologic: Grossly non-focal  Lab Results Recent Labs    05/20/19 0340 05/20/19 0340 05/20/19 1733 05/21/19 0400  WBC 14.1*  --   --  11.5*  HGB 9.9*  --   --  10.2*  HCT 28.9*  --   --  30.9*  NA 136   < > 137 136  K 2.9*   < > 3.6 3.2*  CL 95*   < > 96* 96*  CO2 31   < > 32 30  BUN 27*   < > 15 12  CREATININE 0.43*   < > 0.36* <0.30*   < > = values in this interval not displayed.   Liver Panel Recent Labs    05/19/19 1153  PROT 8.2*  ALBUMIN 3.0*  AST 15  ALT 10  ALKPHOS 161*  BILITOT 1.1   Sedimentation  Rate Recent Labs    05/21/19 0400  ESRSEDRATE 126*   C-Reactive Protein Recent Labs    05/21/19 0400  CRP 5.4*    Microbiology:  Studies/Results: CT ABDOMEN PELVIS WO CONTRAST  Result Date: 05/19/2019 CLINICAL DATA:  Status post fall. EXAM: CT ABDOMEN AND PELVIS WITHOUT CONTRAST TECHNIQUE: Multidetector CT imaging of the abdomen and pelvis was performed following the standard protocol without IV contrast. COMPARISON:  April 21, 2019. FINDINGS: Lower chest: No acute abnormality. Hepatobiliary: Solitary gallstone is noted. No biliary dilatation is noted. However left hepatic pneumobilia is noted. Pancreas: Unremarkable. No pancreatic ductal dilatation or surrounding inflammatory changes. Spleen: Normal in size without focal abnormality. Adrenals/Urinary Tract: Adrenal glands appear normal. Small nonobstructive left renal calculus is noted. No hydronephrosis or renal obstruction is noted. Mild urinary bladder distention is noted. Stomach/Bowel: Stomach is within normal limits. Appendix appears normal. No evidence of bowel wall thickening, distention, or inflammatory changes. Vascular/Lymphatic: Aortic atherosclerosis. No enlarged abdominal or pelvic lymph nodes. Reproductive: Uterus and bilateral adnexa are unremarkable. Other: Moderate size fat containing periumbilical hernia is noted in the  pelvis. No ascites is noted. Musculoskeletal: Un healed comminuted fracture is seen involving the greater tuberosity of the proximal right femur. Multilevel degenerative disc disease is noted in the lumbar spine. There appears to be lytic destruction involving the adjacent articular surfaces of L3-4 disc space concerning for discitis and osteomyelitis. MRI is recommended for further evaluation. IMPRESSION: 1. Unhealed comminuted fracture is seen involving the greater tuberosity of the proximal right femur. 2. There appears to be lytic destruction involving the adjacent vertebral endplates of L3-4 disc space  concerning for discitis and osteomyelitis. MRI is recommended for further evaluation. 3. Solitary gallstone is noted. Left hepatic pneumobilia is noted which may be due to prior sphincterotomy. 4. Small nonobstructive left renal calculus. 5. Moderate size fat containing periumbilical hernia. Aortic Atherosclerosis (ICD10-I70.0). Electronically Signed   By: Lupita Raider M.D.   On: 05/19/2019 12:27   CT Head Wo Contrast  Result Date: 05/19/2019 CLINICAL DATA:  Head trauma, headache. Neck trauma, uncomplicated. Additional history provided: Reported fall last night hitting head. EXAM: CT HEAD WITHOUT CONTRAST CT CERVICAL SPINE WITHOUT CONTRAST TECHNIQUE: Multidetector CT imaging of the head and cervical spine was performed following the standard protocol without intravenous contrast. Multiplanar CT image reconstructions of the cervical spine were also generated. COMPARISON:  Head CT 04/19/2019, CT cervical spine 07/03/2018. FINDINGS: CT HEAD FINDINGS Brain: There is a small acute/subacute appearing cortical infarct within the mid to anterior left frontal lobe. This was not present on prior head CT 04/19/2019 (series 2, image 19) (series 4, image 16). Scattered ill-defined hypoattenuation within the cerebral white matter is unchanged. Stable, mild generalized parenchymal atrophy. There is no acute intracranial hemorrhage. No extra-axial fluid collection. No evidence of intracranial mass. No midline shift. Vascular: No hyperdense vessel.  Atherosclerotic calcifications. Skull: Normal. Negative for fracture or focal lesion. Sinuses/Orbits: Visualized orbits show no acute finding. Minimal ethmoid sinus mucosal thickening. Trace fluid within the bilateral mastoid air cells. CT CERVICAL SPINE FINDINGS Alignment: Straightening of the expected cervical lordosis. Mild C3-C4 and C4-C5 grade 1 anterolisthesis. Skull base and vertebrae: The basion-dental and atlanto-dental intervals are maintained.No evidence of acute  fracture to the cervical spine. Soft tissues and spinal canal: No prevertebral fluid or swelling. No visible canal hematoma. Disc levels: Cervical spondylosis with multilevel disc space narrowing, posterior disc osteophytes, uncovertebral and facet hypertrophy. Disc space narrowing is severe at C5-C6, C6-C7, C7-T1, T1-T2 and T2-T3. 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 Multilevel bony spinal canal stenosis, greatest at C5-C6 and C6-C7 (at least moderate in severity at these levels). Bulky multilevel ventrolateral osteophytes. Redemonstrated multilevel degenerative fusion. Upper chest: No consolidation within the imaged lung apices. No visible pneumothorax. Other: Atherosclerotic plaque within the proximal major branch vessels of the neck and bilateral carotid arteries. IMPRESSION: CT head: 1. Small acute/subacute appearing cortically based infarct within the mid to anterior left frontal lobe. This finding was not present on prior head CT 04/19/2019. 2. No acute intracranial hemorrhage 3. Stable nonspecific cerebral white matter disease, most commonly due to small vessel ischemia. 4. Stable mild generalized parenchymal atrophy. 5. Trace bilateral mastoid effusions. CT cervical spine: 1. No evidence of acute fracture to the cervical spine. 2. 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 3. Unchanged C3-C4 and C4-C5 grade 1 anterolisthesis. Electronically Signed   By: Jackey Loge DO   On: 05/19/2019 12:36   CT Cervical Spine Wo Contrast  Result Date: 05/19/2019 CLINICAL DATA:  Head trauma, headache. Neck trauma,  uncomplicated. Additional history provided: Reported fall last night hitting head. EXAM: CT HEAD WITHOUT CONTRAST CT CERVICAL SPINE WITHOUT CONTRAST TECHNIQUE: Multidetector CT imaging of the head and cervical spine was performed following the standard protocol without intravenous contrast. Multiplanar CT image  reconstructions of the cervical spine were also generated. COMPARISON:  Head CT 04/19/2019, CT cervical spine 07/03/2018. FINDINGS: CT HEAD FINDINGS Brain: There is a small acute/subacute appearing cortical infarct within the mid to anterior left frontal lobe. This was not present on prior head CT 04/19/2019 (series 2, image 19) (series 4, image 16). Scattered ill-defined hypoattenuation within the cerebral white matter is unchanged. Stable, mild generalized parenchymal atrophy. There is no acute intracranial hemorrhage. No extra-axial fluid collection. No evidence of intracranial mass. No midline shift. Vascular: No hyperdense vessel.  Atherosclerotic calcifications. Skull: Normal. Negative for fracture or focal lesion. Sinuses/Orbits: Visualized orbits show no acute finding. Minimal ethmoid sinus mucosal thickening. Trace fluid within the bilateral mastoid air cells. CT CERVICAL SPINE FINDINGS Alignment: Straightening of the expected cervical lordosis. Mild C3-C4 and C4-C5 grade 1 anterolisthesis. Skull base and vertebrae: The basion-dental and atlanto-dental intervals are maintained.No evidence of acute fracture to the cervical spine. Soft tissues and spinal canal: No prevertebral fluid or swelling. No visible canal hematoma. Disc levels: Cervical spondylosis with multilevel disc space narrowing, posterior disc osteophytes, uncovertebral and facet hypertrophy. Disc space narrowing is severe at C5-C6, C6-C7, C7-T1, T1-T2 and T2-T3. 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 Multilevel bony spinal canal stenosis, greatest at C5-C6 and C6-C7 (at least moderate in severity at these levels). Bulky multilevel ventrolateral osteophytes. Redemonstrated multilevel degenerative fusion. Upper chest: No consolidation within the imaged lung apices. No visible pneumothorax. Other: Atherosclerotic plaque within the proximal major branch vessels of the neck and bilateral carotid arteries. IMPRESSION:  CT head: 1. Small acute/subacute appearing cortically based infarct within the mid to anterior left frontal lobe. This finding was not present on prior head CT 04/19/2019. 2. No acute intracranial hemorrhage 3. Stable nonspecific cerebral white matter disease, most commonly due to small vessel ischemia. 4. Stable mild generalized parenchymal atrophy. 5. Trace bilateral mastoid effusions. CT cervical spine: 1. No evidence of acute fracture to the cervical spine. 2. 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 3. Unchanged C3-C4 and C4-C5 grade 1 anterolisthesis. Electronically Signed   By: Jackey Loge DO   On: 05/19/2019 12:36   MR ANGIO NECK W WO CONTRAST  Result Date: 05/19/2019 CLINICAL DATA:  Fall with altered mental status. EXAM: MRA NECK WITHOUT AND WITH CONTRAST TECHNIQUE: Multiplanar and multiecho pulse sequences of the neck were obtained without and with intravenous contrast. Angiographic images of the neck were obtained using MRA technique without and with intravenous contrast. CONTRAST:  5mL GADAVIST GADOBUTROL 1 MMOL/ML IV SOLN COMPARISON:  None. FINDINGS: Branching pattern is normal without origin stenosis. Both common carotid arteries widely patent to the bifurcation. Both carotid bifurcations widely patent. Cervical internal carotid arteries widely patent. Both vertebral artery origins widely patent. Both vertebral arteries are widely patent through the cervical region to the foramen magnum. Circle Willis vessels included on this scan. Low resolution evaluation shows patency of the major vessels without evidence of aneurysm or correctable proximal stenosis. IMPRESSION: Negative MR angiography of the neck vessels. No stenosis or dissection. Electronically Signed   By: Paulina Fusi M.D.   On: 05/19/2019 16:53   MR BRAIN WO CONTRAST  Result Date: 05/19/2019 CLINICAL DATA:  Fall with altered mental  status. Left frontal infarction seen by CT. EXAM:  MRI HEAD WITHOUT CONTRAST TECHNIQUE: Multiplanar, multiecho pulse sequences of the brain and surrounding structures were obtained without intravenous contrast. COMPARISON:  Head CT earlier same day. FINDINGS: Brain: Diffusion imaging does not show any acute or subacute infarction. The left frontal abnormality described by CT does not show restricted diffusion but shows an area of edema with small areas of internal microhemorrhage, most consistent with a small hemorrhagic contusion. The differential diagnosis does include late subacute infarction, but I favor contusion. Elsewhere, the brainstem shows chronic small-vessel ischemic changes. No focal cerebellar insult. Mild chronic small-vessel ischemic change elsewhere affects the cerebral hemispheric white matter. No hydrocephalus. No extra-axial collection Vascular: Major vessels at the base of the brain show flow. Skull and upper cervical spine: Negative Sinuses/Orbits: Clear/normal Other: None IMPRESSION: No acute or subacute infarction. The left frontal abnormality shown by CT is most consistent with a small hemorrhagic contusion. Elsewhere, the brain shows chronic small-vessel ischemic changes. Electronically Signed   By: Paulina Fusi M.D.   On: 05/19/2019 16:51   MR THORACIC SPINE WO CONTRAST  Result Date: 05/19/2019 CLINICAL DATA:  Mental status changes.  Fall. EXAM: MRI THORACIC SPINE WITHOUT CONTRAST TECHNIQUE: Multiplanar, multisequence MR imaging of the thoracic spine was performed. No intravenous contrast was administered. COMPARISON:  None. FINDINGS: Alignment:  No traumatic malalignment. Vertebrae: No thoracic region fracture. Cord:  No cord compression or primary cord lesion. Paraspinal and other soft tissues: Negative Disc levels: Detail is limited because of motion. Degenerative spondylosis and facet arthritis in the thoracic region as often seen at this age. No evidence of compressive canal stenosis. Foraminal stenosis on the right at T4-5 due to  osteophytic encroachment could possibly affect the right C5 nerve. The other foramina appear sufficiently patent. IMPRESSION: Motion degraded study. No evidence of acute thoracic region fracture or cord injury. Degenerative spondylosis and facet arthropathy, with limited evaluation due to motion. No apparent compressive canal stenosis. Right foraminal narrowing at T4-5 because of osteophytic encroachment. Electronically Signed   By: Paulina Fusi M.D.   On: 05/19/2019 16:59   MR CERVICAL SPINE W WO CONTRAST  Result Date: 05/19/2019 CLINICAL DATA:  Fall with trauma to the head and neck. EXAM: MRI CERVICAL SPINE WITHOUT AND WITH CONTRAST TECHNIQUE: Multiplanar and multiecho pulse sequences of the cervical spine, to include the craniocervical junction and cervicothoracic junction, were obtained without and with intravenous contrast. CONTRAST:  8mL GADAVIST GADOBUTROL 1 MMOL/ML IV SOLN COMPARISON:  CT earlier same day. FINDINGS: The study suffers from considerable motion degradation. Alignment: Straightening of the normal cervical lordosis. No traumatic malalignment. Vertebrae: No evidence of any fracture or bone marrow edema. Cord: No cord compression or primary cord lesion. Posterior Fossa, vertebral arteries, paraspinal tissues: See results of brain MRI. Disc levels: Limited detail because of motion. Degenerative spondylosis from C3-4 through C7-T1. Narrowing of the ventral subarachnoid space but no compression of the cord. Mild bilateral foraminal narrowing throughout that region. IMPRESSION: Motion degraded exam with limited detail. The study does not suggest any traumatic injury to the cervical spine or spinal cord. 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 No compressive canal stenosis. Bilateral foraminal narrowing through that region is likely chronic. Electronically Signed   By: Paulina Fusi M.D.   On: 05/19/2019 16:57   MR Lumbar Spine W Wo Contrast  Result Date: 05/19/2019 CLINICAL DATA:  Larey Seat.   Abnormal CT of the lumbar region. EXAM: MRI LUMBAR SPINE WITHOUT AND WITH CONTRAST TECHNIQUE: Multiplanar and  multiecho pulse sequences of the lumbar spine were obtained without and with intravenous contrast. CONTRAST:  68mL GADAVIST GADOBUTROL 1 MMOL/ML IV SOLN COMPARISON:  CT 05/19/2019 FINDINGS: Segmentation:  5 lumbar type vertebral bodies. Alignment:  Curvature convex to the left the apex at L2-3. Vertebrae: Chronic discogenic endplate changes at L2-3, L4-5 and L5-S1. At L3-4, the disc space is filled with fluid intensity material. There is edema and enhancement of the L3 and L4 vertebral bodies. There is regional epidural edema and enhancement. 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 There are fluid collections within the psoas muscles bilaterally, also most consistent with infection. Conus medullaris and cauda equina: Conus extends to the L1-2 level. Conus and cauda equina appear normal. Paraspinal and other soft tissues: Bilateral psoas muscle fluid collections most consistent with abscesses. Psoas hematomas could have a similar appearance in a different setting. Disc levels: L1-2: Disc bulge.  No compressive stenosis. L2-3: Endplate osteophytes and bulging of the disc. Mild facet hypertrophy. Moderate multifactorial stenosis. L3-4: As noted above, fluid intensity material filling the disc space. Edema of the L3 and L4 vertebral bodies. Epidural edema and enhancement. 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 L4-5: Spondylosis with endplate osteophytes and chronic disc protrusion. Moderate multifactorial stenosis that could cause neural compression. L5-S1: Endplate osteophytes and bulging of the disc. No central canal stenosis. Moderate left foraminal stenosis. 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 See above for full discussion. Electronically Signed   By: Paulina Fusi M.D.   On: 05/19/2019 17:04   DG Chest Portable 1 View  Result Date: 05/19/2019 CLINICAL DATA:  Fall, altered mental status EXAM: PORTABLE CHEST 1 VIEW COMPARISON:  04/28/2019 FINDINGS: The heart size and mediastinal contours are within normal limits. Interval resolution of previously seen right-sided airspace consolidation. No focal airspace consolidation, pleural effusion, or pneumothorax. Degenerative changes of the shoulders, right worse than left. IMPRESSION: No acute cardiopulmonary findings. Interval resolution of previously seen right-sided airspace consolidation. Electronically Signed   By: Duanne Guess D.O.   On: 05/19/2019 12:04   ECHOCARDIOGRAM COMPLETE  Result Date: 05/20/2019    ECHOCARDIOGRAM REPORT   Patient Name:   Alicia Andrade Date of Exam: 05/20/2019 Medical Rec #:  [id number]   Height:       62.0 in Accession #:    [id number]  Weight:       126.8 lb Date of Birth:  1960-12-02   BSA:          1.575 m Patient Age:    58 years    BP:           153/78 mmHg Patient Gender: F           HR:           101 bpm. Exam Location:  [facility] Procedure: 2D Echo, Color Doppler, Cardiac Doppler and Saline Contrast Bubble            Study Indications:     I163.9 Stroke  History:         Patient has no prior history of Echocardiogram examinations.                  Risk Factors:Diabetes.  Sonographer:     Humphrey Rolls RDCS (AE) Referring Phys:  [id number] Gerri Lins Diagnosing Phys: Harold Hedge MD IMPRESSIONS  1. Left ventricular ejection fraction, by estimation,  is 55 to 60%. The left ventricle has normal function. The left ventricle has no regional wall motion abnormalities. Left ventricular diastolic parameters are consistent with Grade I diastolic dysfunction (impaired relaxation).  2. Right ventricular systolic function is normal. The right ventricular size is normal.  3. Left atrial size was mildly dilated.  4. The mitral valve is  grossly normal. Trivial mitral valve regurgitation. Mild mitral stenosis.  5. The aortic valve was not well visualized. Aortic valve regurgitation is trivial. Mild to moderate aortic valve sclerosis/calcification is present, without any evidence of aortic stenosis.  6. Aortic dilatation noted. There is borderline dilatation of the aortic root measuring 39 mm.  7. Agitated saline contrast bubble study was negative, with no evidence of any interatrial shunt. FINDINGS  Left Ventricle: Left ventricular ejection fraction, by estimation, is 55 to 60%. The left ventricle has normal function. The left ventricle has no regional wall motion abnormalities. The left ventricular internal cavity size was normal in size. There is  no left ventricular hypertrophy. Left ventricular diastolic parameters are consistent with Grade I diastolic dysfunction (impaired relaxation). Right Ventricle: The right ventricular size is normal. No increase in right ventricular wall thickness. Right ventricular systolic function is normal. Left Atrium: Left atrial size was mildly dilated. Right Atrium: Right atrial size was normal in size. Pericardium: There is no evidence of pericardial effusion. Mitral Valve: The mitral valve is grossly normal. Trivial mitral valve regurgitation. Mild mitral valve stenosis. MV peak gradient, 24.2 mmHg. The mean mitral valve gradient is 12.0 mmHg. Tricuspid Valve: The tricuspid valve is not well visualized. Tricuspid valve regurgitation is trivial. Aortic Valve: The aortic valve was not well visualized. Aortic valve regurgitation is trivial. Mild to moderate aortic valve sclerosis/calcification is present, without any evidence of aortic stenosis. Aortic valve mean gradient measures 5.0 mmHg. Aortic  valve peak gradient measures 8.8 mmHg. Aortic valve area, by VTI measures 4.01 cm. Pulmonic Valve: The pulmonic valve was not well visualized. Pulmonic valve regurgitation is trivial. Aorta: Aortic dilatation noted.  There is borderline dilatation of the aortic root measuring 39 mm. IAS/Shunts: The atrial septum is grossly normal. Agitated saline contrast was given intravenously to evaluate for intracardiac shunting. Agitated saline contrast bubble study was negative, with no evidence of any interatrial shunt.  LEFT VENTRICLE PLAX 2D LVIDd:         4.49 cm  Diastology LVIDs:         3.24 cm  LV e' lateral:   4.35 cm/s LV PW:         0.91 cm  LV E/e' lateral: 35.2 LV IVS:        1.08 cm  LV e' medial:    4.35 cm/s LVOT diam:     2.30 cm  LV E/e' medial:  35.2 LV SV:         89 LV SV Index:   57 LVOT Area:     4.15 cm  RIGHT VENTRICLE RV Basal diam:  3.68 cm LEFT ATRIUM             Index       RIGHT ATRIUM           Index LA diam:        3.60 cm 2.29 cm/m  RA Area:     17.60 cm LA Vol (A2C):   87.0 ml 55.24 ml/m RA Volume:   46.70 ml  29.65 ml/m LA Vol (A4C):   68.8 ml 43.69 ml/m LA Biplane Vol: 79.4  ml 50.42 ml/m  AORTIC VALVE                    PULMONIC VALVE AV Area (Vmax):    3.34 cm     PV Vmax:       0.88 m/s AV Area (Vmean):   3.47 cm     PV Vmean:      60.800 cm/s AV Area (VTI):     4.01 cm     PV VTI:        0.143 m AV Vmax:           148.00 cm/s  PV Peak grad:  3.1 mmHg AV Vmean:          105.000 cm/s PV Mean grad:  2.0 mmHg AV VTI:            0.223 m AV Peak Grad:      8.8 mmHg AV Mean Grad:      5.0 mmHg LVOT Vmax:         119.00 cm/s LVOT Vmean:        87.600 cm/s LVOT VTI:          0.215 m LVOT/AV VTI ratio: 0.96  AORTA Ao Root diam: 3.00 cm MITRAL VALVE MV Area (PHT): 7.37 cm     SHUNTS MV Peak grad:  24.2 mmHg    Systemic VTI:  0.22 m MV Mean grad:  12.0 mmHg    Systemic Diam: 2.30 cm MV Vmax:       2.46 m/s MV Vmean:      159.0 cm/s MV Decel Time: 103 msec MV E velocity: 153.00 cm/s MV A velocity: 227.00 cm/s MV E/A ratio:  0.67 Harold Hedge MD Electronically signed by Harold Hedge MD Signature Date/Time: 05/20/2019/1:21:38 PM    Final      Assessment/Plan: 59 year old female was been chronically  ill for the past 6 to 8 weeks with uncontrolled diabetes, frequent falls, unstable gait, pain in her knees and back and weight loss. Does she have a overarching diagnosis for all of these symptoms?  Low back pain.  MRI shows changes in L3-L4 disc and also bilateral psoas collection.  Need to rule out discitis and psoas abscess. Drainage of psoas fluid done by IR , I spoke to Dr.Shick. less than 1 cc drained and sent for culture-  IF this does not yield any bacteria then she will need disc aspiration/biopsy- Spoke to [facility] Neurosurgeon who recommend IR do the procedure and if unable to do it here patient will have to be transferred to [facility] or [facility]  As patient clinically stale and not septic will not start antibiotics until a microbiological diagnosis is established  She also had E. coli bacteremia in April 2021 at the previous hospital. Hold off antibiotics as she is clinically stable until the procedure is done and cultures have been sent.  Diabetes mellitus poorly controlled.  In April 2020 hemoglobin was more than 14 but now it is 8.8.  Hypokalemia, hyponatremia and hypomagnesemia ? Weight loss and malnutrition.  Unclear etiology Rule out malignancy.  History of frequent falls ?neuropathy  Encephalopathy and memory loss  History of's CVA.  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

## 2019-05-21 NOTE — Progress Notes (Signed)
SLP Cancellation Note  Patient Details Name: Alicia Andrade MRN: [id number] DOB: [birth year]   Cancelled treatment:       Reason Eval/Treat Not Completed: Patient at procedure or test/unavailable   Pt was out of room for procedure. Pt's sister in room and gave update on pt's mentation. This morning pt didn't recognize her sister and thought she was a doctor. Pt required maximal cues by sister to recognize her. ST will continue to follow pt.   Alicia Andrade B. Dreama Saa M.S., CCC-SLP, [facility] Speech-Language Pathologist Rehabilitation Services Office [phone]    Alicia Andrade 05/21/2019, 9:34 AM

## 2019-05-21 NOTE — TOC Progression Note (Signed)
Transition of Care [facility]) - Progression Note    Patient Details  Name: Alicia Andrade MRN: [id number] Date of Birth: 15-Mar-1960  Transition of Care [facility]) CM/SW Contact  Daiki Dicostanzo, Gardiner Rhyme, LCSW Phone Number: 05/21/2019, 2:19 PM  Clinical Narrative:   Met with pt and sister to discuss facilities and sister had several she wanted this worker to contact and see if could offer pt a bed. Will call then and send some information and await therapy eval. Hopefully can find a facility close to sister for pt to go to. Work on this and follow to provide support.    Expected Discharge Plan: [place] Barriers to Discharge: Continued Medical Work up  Expected Discharge Plan and Services Expected Discharge Plan: [place] In-house Referral: Clinical Social Work     Living arrangements for the past 2 months: Single Family Home                                       Social Determinants of Health (SDOH) Interventions    Readmission Risk Interventions No flowsheet data found.

## 2019-05-21 NOTE — Progress Notes (Addendum)
Pharmacy Antibiotic Note  Alicia Andrade is a 59 y.o. 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  Pharmacy has been consulted for vancomycin and cefepime dosing.  Today, 05/21/2019 - antibiotics to start today after IR aspiration of bilateral paraspinal abscesses - Scr quite low and not likely a reliable predictor of actual renal function - afebrile - WBC elevated - MRSA PCR neg  Plan:  Vancomycin 1250mg  IV x 1 then 750mg  IV q12h based on current weight and estimated CrCl ~68 ml/min using a SCr 0.8 mg/dl  Goal trough mcg/ml (no AUC dosing during vancomycin level reagent shortage)  Cefepime 2gm IV q8h  On lamotrogine but not for seizure, for bipolar  Monitor renal function  Anticipate will need trough on day 3-4 of vancomycin therapy if suspect that vancomycin will continue (ie. Unrevealing cultures, MRSA on cx, etc).   ADDENDUM - ID stopping antibiotics and will await culture results as patient may be biopsy.    Height: 5\' 2"  (157.5 cm) Weight: 57.5 kg (126 lb 12.2 oz) IBW/kg (Calculated) : 50.1  Temp (24hrs), Avg:97.8 F (36.6 C), Min:97.1 F (36.2 C), Max:98.8 F (37.1 C)  Recent Labs  Lab 05/19/19 1153 05/19/19 2045 05/20/19 0340 05/20/19 1733 05/21/19 0400  WBC 12.7*  --  14.1*  --  11.5*  CREATININE 0.45 0.40* 0.43* 0.36* <0.30*    CrCl cannot be calculated (This lab value cannot be used to calculate CrCl because it is not a number: <0.30).    No Known Allergies  Antimicrobials this admission: 5/12 vanco >> 5/12 Cefepime >>  Dose adjustments this admission:   Microbiology results: 5/11 BCx: NGTD 5/12 Paraspinous abscess: 5/11 MRSA PCR: neg  Thank you for allowing pharmacy to be a part of this patient's care.  7/11, PharmD, BCPS.   Work Cell: [phone] 05/21/2019 12:04 PM

## 2019-05-21 NOTE — Procedures (Signed)
Interventional Radiology Procedure Note  Procedure: CT ASPIRATION PARASPINOUS ABSCESSES AT L3/4 BILATERALLY  Complications: None  Estimated Blood Loss: MIN  Findings: SCANT BLOOD TINGED EXUDATIVE FLD BILATERAL ASPIRATES  CXS SENT

## 2019-05-21 NOTE — Progress Notes (Signed)
PROGRESS NOTE    Alicia Andrade  QJJ:[id number] DOB: 1960/01/20 DOA: 05/19/2019 PCP: Galvin Proffer, MD      Brief Narrative:  [name] is a 59 y.o. F with bipolar disorder, HTN, DM, hypothyroidism, and recent femur fracture if with fall, confusion, generalized weakness.  The ER, CT imaging of the back showed findings concerning for discitis versus osteomyelitis.  The patient was admitted to the hospital service.      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  Here, imaging of the brain ruled out stroke, but imaging with MR and contrast of the entire spine showed findings consistent with L3-L4 discitis, as well as L3 and L4 osteomyelitis, bilateral psoas abscess.  Other differential included hematoma, cancer.  Underwent IR guided disc space aspiration today. -Follow disc space aspirate culture -Consult infectious disease, appreciate cares -Discussed with Dr. Joylene Draft: Patient clinically very stable, we will hold antibiotics, follow culture data, if culture negative, will pursue biopsy -Analgesics with oxycodone or morphine as needed -Follow-up SPEP   Bipolar disorder -Continue lamotrigine, venlafaxine  Recent femur fracture Vitamin D deficiency  Cerebral contusion Initially thought to be related to stroke.  Stroke ruled out with MRI. -Supportive care  Hypothyroidism -Continue levothyroxine  Hypertension -Blood pressure elevated -Resume losartan, HCTZ  Diabetes with Peripheral neuropathy Glucoses one thirties to one eighties -Continue sliding scale corrections -Continue baby aspirin, simvastatin -Continue Lantus -Continue gabapentin  Moderate protein calorie malnutrition -Consult dietitian  Pressure injury, sacrum, unstageable, present on admission Pressure injury buttocks, left, stage II, present on admission  Anemia of  chronic disease Stable, no clinical bleeding  Hypokalemia Mild to -Supplement potassium            Disposition: Status is: Inpatient  Remains inpatient appropriate because:Patient requires ongoing work-up in the hospital for suspected discitis, possibly long-term IV antibiotics   Dispo: The patient is from: Home              Anticipated d/c is to: SNF              Anticipated d/c date is: > 3 days              Patient currently is not medically stable to d/c.    The patient had a disc aspirated today, she will need monitoring of this culture, if no culture further work-up with biopsy.  If the culture is growing bacteria, we will start IV antibiotics, place PICC, plan for long-term IV antibiotics under the direction of infectious disease.           MDM: The below labs and imaging reports were reviewed and summarized above.  Medication management as above.   DVT prophylaxis: SCD Code Status: Full code Family Communication: Sister at the bedside    Consultants:   Infectious disease  IR  Neurosurgery  Procedures:   5/12 CT-guided disc space aspiration  Antimicrobials:      Culture data:   5/11 blood culture  5/12 disc aspirate --pending          Subjective: Patient has severe back pain.  She just came back from her disc space aspiration.  No fever overnight.  No confusion.  No vomiting, diarrhea.  No respiratory distress.  No leg weakness.  Objective: Vitals:   05/21/19 1000 05/21/19 1015 05/21/19 1053 05/21/19 1400  BP: (!) 162/81 (!) 148/83 (!) 146/78 131/68  Pulse: Marland Kitchen)  104 (!) 101 96   Resp: 15 (!) Temp:   (!) 97.1 F (36.2 C) (!) 97 F (36.1 C)  TempSrc:   Axillary Oral  SpO2: 100% 100%  100%  Weight:      Height:        Intake/Output Summary (Last 24 hours) at 05/21/2019 1533 Last data filed at 05/21/2019 1345 Gross per 24 hour  Intake 120 ml  Output 1100 ml  Net -980 ml   Filed Weights   05/19/19 1138  05/19/19 1718  Weight: 56.7 kg 57.5 kg    Examination: General appearance:  adult female, awake, but still recovering from sedation, in obvious pain from her back.    HEENT: Eyes closed, lids and lashes normal. No nasal deformity, discharge, epistaxis.  Lips moist, partially edentulous, oropharynx moist, no oral lesions.   Skin: Warm and dry.  No jaundice.  No suspicious rashes or lesions. Cardiac: RRR, nl S1-S2, no murmurs appreciated.  Capillary refill is brisk.  JVPnot visible.  No LE edema.  Radial pulses 2+ and symmetric. Respiratory: Normal respiratory rate and rhythm.  CTAB without rales or wheezes. Abdomen: Abdomen soft.  No TTP or guarding. No ascites, distension, hepatosplenomegaly.   MSK: No deformities or effusions. Neuro: Awake but still recovering from sedation, eyes closed, mostly not following exams.  EOMI, moves all extremities with generalized weakness, limited by pain. Speech fluent.    Psych: Sensorium intact and responding to questions, and diminished, affect blunted by pain.  Judgment and insight unable to assess.    Data Reviewed: I have personally reviewed following labs and imaging studies:  CBC: Recent Labs  Lab 05/19/19 1153 05/20/19 0340 05/21/19 0400  WBC 12.7* 14.1* 11.5*  NEUTROABS 11.5* 12.0* 9.6*  HGB 11.0* 9.9* 10.2*  HCT 32.3* 28.9* 30.9*  MCV 88.7 86.0 88.8  PLT 349 327 382   Basic Metabolic Panel: Recent Labs  Lab 05/19/19 1153 05/19/19 2045 05/20/19 0340 05/20/19 0837 05/20/19 1733 05/21/19 0400  NA 134* 135 136  --  137 136  K 2.6* 2.9* 2.9*  --  3.6 3.2*  CL 91* 93* 95*  --  96* 96*  CO2 --  32 30  GLUCOSE 242* 184* 257*  --  174* 168*  BUN 36* 29* 27*  --  15 12  CREATININE 0.45 0.40* 0.43*  --  0.36* <0.30*  CALCIUM 8.8* 8.5* 8.4*  --  8.7* 8.5*  MG 1.7  --   --  1.4*  --  1.9   GFR: CrCl cannot be calculated (This lab value cannot be used to calculate CrCl because it is not a number: <0.30). Liver Function  Tests: Recent Labs  Lab 05/19/19 1153  AST 15  ALT 10  ALKPHOS 161*  BILITOT 1.1  PROT 8.2*  ALBUMIN 3.0*   No results for input(s): LIPASE, AMYLASE in the last 168 hours. No results for input(s): AMMONIA in the last 168 hours. Coagulation Profile: Recent Labs  Lab 05/19/19 1153  INR 1.1   Cardiac Enzymes: No results for input(s): CKTOTAL, CKMB, CKMBINDEX, TROPONINI in the last 168 hours. BNP (last 3 results) No results for input(s): PROBNP in the last 8760 hours. HbA1C: Recent Labs    05/20/19 0340  HGBA1C 8.8*   CBG: Recent Labs  Lab 05/20/19 1714 05/20/19 2104 05/21/19 0736 05/21/19 0828 05/21/19 1138  GLUCAP 163* 138* 181* 176* 184*   Lipid Profile: Recent Labs    05/20/19 0340  CHOL 174  HDL 37*  LDLCALC 109*  TRIG 140  CHOLHDL 4.7   Thyroid Function Tests: No results for input(s): TSH, T4TOTAL, FREET4, T3FREE, THYROIDAB in the last 72 hours. Anemia Panel: Recent Labs    05/20/19 1734  VITAMINB12 435  FOLATE 8.8   Urine analysis:    Component Value Date/Time   COLORURINE YELLOW (A) 05/20/2019 1500   APPEARANCEUR TURBID (A) 05/20/2019 1500   LABSPEC 1.004 (L) 05/20/2019 1500   PHURINE 5.0 05/20/2019 1500   GLUCOSEU NEGATIVE 05/20/2019 1500   HGBUR MODERATE (A) 05/20/2019 1500   BILIRUBINUR NEGATIVE 05/20/2019 1500   KETONESUR NEGATIVE 05/20/2019 1500   PROTEINUR NEGATIVE 05/20/2019 1500   NITRITE NEGATIVE 05/20/2019 1500   LEUKOCYTESUR LARGE (A) 05/20/2019 1500   Sepsis Labs: @LABRCNTIP (procalcitonin:4,lacticacidven:4)  ) Recent Results (from the past 240 hour(s))  SARS Coronavirus 2 by RT PCR (hospital order, performed in [facility] hospital lab) Nasopharyngeal Nasopharyngeal Swab     Status: None   Collection Time: 05/19/19  1:47 PM   Specimen: Nasopharyngeal Swab  Result Value Ref Range Status   SARS Coronavirus 2 NEGATIVE NEGATIVE Final    Comment: (NOTE) SARS-CoV-2 target nucleic acids are NOT DETECTED. The SARS-CoV-2 RNA  is generally detectable in upper and lower respiratory specimens during the acute phase of infection. The lowest concentration of SARS-CoV-2 viral copies this assay can detect is 250 copies / mL. A negative result does not preclude SARS-CoV-2 infection and should not be used as the sole basis for treatment or other patient management decisions.  A negative result may occur with improper specimen collection / handling, submission of specimen other than nasopharyngeal swab, presence of viral mutation(s) within the areas targeted by this assay, and inadequate number of viral copies (<250 copies / mL). A negative result must be combined with clinical observations, patient history, and epidemiological information. Fact Sheet for Patients:   07/19/19 Fact Sheet for Healthcare Providers: BoilerBrush.com.cy This test is not yet approved or cleared  by the https://pope.com/ FDA and has been authorized for detection and/or diagnosis of SARS-CoV-2 by FDA under an Emergency Use Authorization (EUA).  This EUA will remain in effect (meaning this test can be used) for the duration of the COVID-19 declaration under Section 564(b)(1) of the Act, 21 U.S.C. section 360bbb-3(b)(1), unless the authorization is terminated or revoked sooner. Performed at [facility], [address] [place], [place]   CULTURE, BLOOD (ROUTINE X 2) w Reflex to ID Panel     Status: None (Preliminary result)   Collection Time: 05/20/19  9:33 AM   Specimen: BLOOD  Result Value Ref Range Status   Specimen Description BLOOD RIGHT ANTECUBITAL  Final   Special Requests   Final    BOTTLES DRAWN AEROBIC AND ANAEROBIC Blood Culture adequate volume   Culture   Final    NO GROWTH < 24 HOURS Performed at [facility], [address] [place], [place]    Report Status PENDING  Incomplete  CULTURE, BLOOD (ROUTINE X 2) w Reflex to ID Panel      Status: None (Preliminary result)   Collection Time: 05/20/19  9:43 AM   Specimen: BLOOD  Result Value Ref Range Status   Specimen Description BLOOD RIGHT WRIST  Final   Special Requests   Final    BOTTLES DRAWN AEROBIC AND ANAEROBIC Blood Culture adequate volume   Culture   Final    NO GROWTH < 24 HOURS Performed at [facility], [address], [place] [address]    Report Status PENDING  Incomplete  MRSA PCR Screening     Status: None   Collection Time: 05/20/19  8:31 PM   Specimen: Nasopharyngeal  Result Value Ref Range Status   MRSA by PCR NEGATIVE NEGATIVE Final    Comment:        The GeneXpert MRSA Assay (FDA approved for NASAL specimens only), is one component of a comprehensive MRSA colonization surveillance program. It is not intended to diagnose MRSA infection nor to guide or monitor treatment for MRSA infections. Performed at [facility], [address], [place], [place] [address]          Radiology Studies: MR ANGIO NECK W WO CONTRAST  Result Date: 05/19/2019 CLINICAL DATA:  Fall with altered mental status. EXAM: MRA NECK WITHOUT AND WITH CONTRAST TECHNIQUE: Multiplanar and multiecho pulse sequences of the neck were obtained without and with intravenous contrast. Angiographic images of the neck were obtained using MRA technique without and with intravenous contrast. CONTRAST:  5mL GADAVIST GADOBUTROL 1 MMOL/ML IV SOLN COMPARISON:  None. FINDINGS: Branching pattern is normal without origin stenosis. Both common carotid arteries widely patent to the bifurcation. Both carotid bifurcations widely patent. Cervical internal carotid arteries widely patent. Both vertebral artery origins widely patent. Both vertebral arteries are widely patent through the cervical region to the foramen magnum. Circle Willis vessels included on this scan. Low resolution evaluation shows patency of the major vessels without evidence of aneurysm or correctable  proximal stenosis. IMPRESSION: Negative MR angiography of the neck vessels. No stenosis or dissection. Electronically Signed   By: Paulina Fusi M.D.   On: 05/19/2019 16:53   MR BRAIN WO CONTRAST  Result Date: 05/19/2019 CLINICAL DATA:  Fall with altered mental status. Left frontal infarction seen by CT. EXAM: MRI HEAD WITHOUT CONTRAST TECHNIQUE: Multiplanar, multiecho pulse sequences of the brain and surrounding structures were obtained without intravenous contrast. COMPARISON:  Head CT earlier same day. FINDINGS: Brain: Diffusion imaging does not show any acute or subacute infarction. The left frontal abnormality described by CT does not show restricted diffusion but shows an area of edema with small areas of internal microhemorrhage, most consistent with a small hemorrhagic contusion. The differential diagnosis does include late subacute infarction, but I favor contusion. Elsewhere, the brainstem shows chronic small-vessel ischemic changes. No focal cerebellar insult. Mild chronic small-vessel ischemic change elsewhere affects the cerebral hemispheric white matter. No hydrocephalus. No extra-axial collection Vascular: Major vessels at the base of the brain show flow. Skull and upper cervical spine: Negative Sinuses/Orbits: Clear/normal Other: None IMPRESSION: No acute or subacute infarction. The left frontal abnormality shown by CT is most consistent with a small hemorrhagic contusion. Elsewhere, the brain shows chronic small-vessel ischemic changes. Electronically Signed   By: Paulina Fusi M.D.   On: 05/19/2019 16:51   MR THORACIC SPINE WO CONTRAST  Result Date: 05/19/2019 CLINICAL DATA:  Mental status changes.  Fall. EXAM: MRI THORACIC SPINE WITHOUT CONTRAST TECHNIQUE: Multiplanar, multisequence MR imaging of the thoracic spine was performed. No intravenous contrast was administered. COMPARISON:  None. FINDINGS: Alignment:  No traumatic malalignment. Vertebrae: No thoracic region fracture. Cord:  No cord  compression or primary cord lesion. Paraspinal and other soft tissues: Negative Disc levels: Detail is limited because of motion. Degenerative spondylosis and facet arthritis in the thoracic region as often seen at this age. No evidence of compressive canal stenosis. Foraminal stenosis on the right at T4-5 due to osteophytic encroachment could  possibly affect the right C5 nerve. The other foramina appear sufficiently patent. IMPRESSION: Motion degraded study. No evidence of acute thoracic region fracture or cord injury. Degenerative spondylosis and facet arthropathy, with limited evaluation due to motion. No apparent compressive canal stenosis. Right foraminal narrowing at T4-5 because of osteophytic encroachment. Electronically Signed   By: Paulina Fusi M.D.   On: 05/19/2019 16:59   MR CERVICAL SPINE W WO CONTRAST  Result Date: 05/19/2019 CLINICAL DATA:  Fall with trauma to the head and neck. EXAM: MRI CERVICAL SPINE WITHOUT AND WITH CONTRAST TECHNIQUE: Multiplanar and multiecho pulse sequences of the cervical spine, to include the craniocervical junction and cervicothoracic junction, were obtained without and with intravenous contrast. CONTRAST:  5mL GADAVIST GADOBUTROL 1 MMOL/ML IV SOLN COMPARISON:  CT earlier same day. FINDINGS: The study suffers from considerable motion degradation. Alignment: Straightening of the normal cervical lordosis. No traumatic malalignment. Vertebrae: No evidence of any fracture or bone marrow edema. Cord: No cord compression or primary cord lesion. Posterior Fossa, vertebral arteries, paraspinal tissues: See results of brain MRI. Disc levels: Limited detail because of motion. Degenerative spondylosis from C3-4 through C7-T1. Narrowing of the ventral subarachnoid space but no compression of the cord. Mild bilateral foraminal narrowing throughout that region. IMPRESSION: Motion degraded exam with limited detail. The study does not suggest any traumatic injury to the cervical spine  or spinal cord. 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 No compressive canal stenosis. Bilateral foraminal narrowing through that region is likely chronic. Electronically Signed   By: Paulina Fusi M.D.   On: 05/19/2019 16:57   MR Lumbar Spine W Wo Contrast  Result Date: 05/19/2019 CLINICAL DATA:  Larey Seat.  Abnormal CT of the lumbar region. EXAM: MRI LUMBAR SPINE WITHOUT AND WITH CONTRAST TECHNIQUE: Multiplanar and multiecho pulse sequences of the lumbar spine were obtained without and with intravenous contrast. CONTRAST:  5mL GADAVIST GADOBUTROL 1 MMOL/ML IV SOLN COMPARISON:  CT 05/19/2019 FINDINGS: Segmentation:  5 lumbar type vertebral bodies. Alignment:  Curvature convex to the left the apex at L2-3. Vertebrae: Chronic discogenic endplate changes at L2-3, L4-5 and L5-S1. At L3-4, the disc space is filled with fluid intensity material. There is edema and enhancement of the L3 and L4 vertebral bodies. There is regional epidural edema and enhancement. 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 There are fluid collections within the psoas muscles bilaterally, also most consistent with infection. Conus medullaris and cauda equina: Conus extends to the L1-2 level. Conus and cauda equina appear normal. Paraspinal and other soft tissues: Bilateral psoas muscle fluid collections most consistent with abscesses. Psoas hematomas could have a similar appearance in a different setting. Disc levels: L1-2: Disc bulge.  No compressive stenosis. L2-3: Endplate osteophytes and bulging of the disc. Mild facet hypertrophy. Moderate multifactorial stenosis. L3-4: As noted above, fluid intensity material filling the disc space. Edema of the L3 and L4 vertebral bodies. Epidural edema and enhancement. 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 L4-5: Spondylosis with endplate osteophytes and chronic disc protrusion.  Moderate multifactorial stenosis that could cause neural compression. L5-S1: Endplate osteophytes and bulging of the disc. No central canal stenosis. Moderate left foraminal stenosis. 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 See above for full discussion. Electronically Signed   By: Paulina Fusi M.D.   On: 05/19/2019 17:04   CT ASPIRATION  Result Date: 05/21/2019 INDICATION: LUMBAR SPINE  OSTEO MYELITIS/DISCITIS, SMALL PARASPINOUS ABSCESSES EXAM: CT ASPIRATION LEFT L3-4 PARASPINOUS ABSCESS CT ASPIRATION RIGHT L3-4 PARASPINOUS ABSCESS MEDICATIONS: The patient is currently admitted to the hospital and receiving intravenous antibiotics. The antibiotics were administered within an appropriate time frame prior to the initiation of the procedure. ANESTHESIA/SEDATION: Fentanyl 25 mcg IV; Versed 0.5 mg IV Moderate Sedation Time:  15 MINUTES The patient was continuously monitored during the procedure by the interventional radiology nurse under my direct supervision. COMPLICATIONS: None immediate. PROCEDURE: Informed written consent was obtained from the patient's power of attorney after a thorough discussion of the procedural risks, benefits and alternatives. All questions were addressed. Maximal Sterile Barrier Technique was utilized including caps, mask, sterile gowns, sterile gloves, sterile drape, hand hygiene and skin antiseptic. A timeout was performed prior to the initiation of the procedure. Previous imaging reviewed. Patient position prone. Noncontrast localization CT performed. 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 CT aspiration right paraspinous abscess: Under sterile conditions and local anesthesia, an 18 gauge 10 cm access needle was advanced into the right paraspinous abscess. Needle position confirmed with CT. Syringe  aspiration yielded only scant bloody fluid. Sample sent for culture. Needle removed. CT aspiration left paraspinous abscess: Also under sterile conditions and local anesthesia, an 18 gauge 10 cm access needle was advanced from a posterior approach to the left paraspinous abscess. Needle position confirmed with CT. Syringe aspiration yielded scant exudative fluid sample sent for culture. Needle removed. Postprocedure imaging demonstrates no hemorrhage or hematoma. Patient tolerated the aspirations well. 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 Electronically Signed   By: Judie Petit.  Shick M.D.   On: 05/21/2019 12:10   CT ASPIRATION  Result Date: 05/21/2019 INDICATION: LUMBAR SPINE OSTEO MYELITIS/DISCITIS, SMALL PARASPINOUS ABSCESSES EXAM: CT ASPIRATION LEFT L3-4 PARASPINOUS ABSCESS CT ASPIRATION RIGHT L3-4 PARASPINOUS ABSCESS MEDICATIONS: The patient is currently admitted to the hospital and receiving intravenous antibiotics. The antibiotics were administered within an appropriate time frame prior to the initiation of the procedure. ANESTHESIA/SEDATION: Fentanyl 25 mcg IV; Versed 0.5 mg IV Moderate Sedation Time:  15 MINUTES The patient was continuously monitored during the procedure by the interventional radiology nurse under my direct supervision. COMPLICATIONS: None immediate. PROCEDURE: Informed written consent was obtained from the patient's power of attorney after a thorough discussion of the procedural risks, benefits and alternatives. All questions were addressed. Maximal Sterile Barrier Technique was utilized including caps, mask, sterile gowns, sterile gloves, sterile drape, hand hygiene and skin antiseptic. A timeout was performed prior to the initiation of the procedure. Previous imaging reviewed. Patient position prone. Noncontrast localization CT performed. The bilateral small L3-4 level paraspinous abscesses at the level of the L3-4  destructive spondylo discitis were localized and marked. CT aspiration right paraspinous abscess: Under sterile conditions and local anesthesia, an 18 gauge 10 cm access needle was advanced into the right paraspinous abscess. Needle position confirmed with CT. Syringe aspiration yielded only scant bloody fluid. Sample sent for culture. Needle removed. CT aspiration left paraspinous abscess: Also under sterile conditions and local anesthesia, an 18 gauge 10 cm access needle was advanced from a posterior approach to the left paraspinous abscess. Needle position confirmed with CT. Syringe aspiration yielded scant exudative fluid sample sent for culture. Needle removed. Postprocedure imaging demonstrates no hemorrhage or hematoma. Patient tolerated the aspirations well. 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 Electronically  Signed   By: Judie Petit.  Shick M.D.   On: 05/21/2019 12:10   ECHOCARDIOGRAM COMPLETE  Result Date: 05/20/2019    ECHOCARDIOGRAM REPORT   Patient Name:   BROOKELYNNE Seher Date of Exam: 05/20/2019 Medical Rec #:  [id number]   Height:       62.0 in Accession #:    [id number]  Weight:       126.8 lb Date of Birth:  10-21-1960   BSA:          1.575 m Patient Age:    58 years    BP:           153/78 mmHg Patient Gender: F           HR:           101 bpm. Exam Location:  [facility] Procedure: 2D Echo, Color Doppler, Cardiac Doppler and Saline Contrast Bubble            Study Indications:     I163.9 Stroke  History:         Patient has no prior history of Echocardiogram examinations.                  Risk Factors:Diabetes.  Sonographer:     Humphrey Rolls RDCS (AE) Referring Phys:  [id number] AVA Gerri Lins Diagnosing Phys: Harold Hedge MD IMPRESSIONS  1. Left ventricular ejection fraction, by estimation, is 55 to 60%. The left ventricle has normal function. The left ventricle has no regional wall motion abnormalities. Left ventricular diastolic parameters are consistent  with Grade I diastolic dysfunction (impaired relaxation).  2. Right ventricular systolic function is normal. The right ventricular size is normal.  3. Left atrial size was mildly dilated.  4. The mitral valve is grossly normal. Trivial mitral valve regurgitation. Mild mitral stenosis.  5. The aortic valve was not well visualized. Aortic valve regurgitation is trivial. Mild to moderate aortic valve sclerosis/calcification is present, without any evidence of aortic stenosis.  6. Aortic dilatation noted. There is borderline dilatation of the aortic root measuring 39 mm.  7. Agitated saline contrast bubble study was negative, with no evidence of any interatrial shunt. FINDINGS  Left Ventricle: Left ventricular ejection fraction, by estimation, is 55 to 60%. The left ventricle has normal function. The left ventricle has no regional wall motion abnormalities. The left ventricular internal cavity size was normal in size. There is  no left ventricular hypertrophy. Left ventricular diastolic parameters are consistent with Grade I diastolic dysfunction (impaired relaxation). Right Ventricle: The right ventricular size is normal. No increase in right ventricular wall thickness. Right ventricular systolic function is normal. Left Atrium: Left atrial size was mildly dilated. Right Atrium: Right atrial size was normal in size. Pericardium: There is no evidence of pericardial effusion. Mitral Valve: The mitral valve is grossly normal. Trivial mitral valve regurgitation. Mild mitral valve stenosis. MV peak gradient, 24.2 mmHg. The mean mitral valve gradient is 12.0 mmHg. Tricuspid Valve: The tricuspid valve is not well visualized. Tricuspid valve regurgitation is trivial. Aortic Valve: The aortic valve was not well visualized. Aortic valve regurgitation is trivial. Mild to moderate aortic valve sclerosis/calcification is present, without any evidence of aortic stenosis. Aortic valve mean gradient measures 5.0 mmHg. Aortic  valve  peak gradient measures 8.8 mmHg. Aortic valve area, by VTI measures 4.01 cm. Pulmonic Valve: The pulmonic valve was not well visualized. Pulmonic valve regurgitation is trivial. Aorta: Aortic dilatation noted. There is borderline dilatation of the aortic root measuring 39 mm. IAS/Shunts: The  atrial septum is grossly normal. Agitated saline contrast was given intravenously to evaluate for intracardiac shunting. Agitated saline contrast bubble study was negative, with no evidence of any interatrial shunt.  LEFT VENTRICLE PLAX 2D LVIDd:         4.49 cm  Diastology LVIDs:         3.24 cm  LV e' lateral:   4.35 cm/s LV PW:         0.91 cm  LV E/e' lateral: 35.2 LV IVS:        1.08 cm  LV e' medial:    4.35 cm/s LVOT diam:     2.30 cm  LV E/e' medial:  35.2 LV SV:         89 LV SV Index:   57 LVOT Area:     4.15 cm  RIGHT VENTRICLE RV Basal diam:  3.68 cm LEFT ATRIUM             Index       RIGHT ATRIUM           Index LA diam:        3.60 cm 2.29 cm/m  RA Area:     17.60 cm LA Vol (A2C):   87.0 ml 55.24 ml/m RA Volume:   46.70 ml  29.65 ml/m LA Vol (A4C):   68.8 ml 43.69 ml/m LA Biplane Vol: 79.4 ml 50.42 ml/m  AORTIC VALVE                    PULMONIC VALVE AV Area (Vmax):    3.34 cm     PV Vmax:       0.88 m/s AV Area (Vmean):   3.47 cm     PV Vmean:      60.800 cm/s AV Area (VTI):     4.01 cm     PV VTI:        0.143 m AV Vmax:           148.00 cm/s  PV Peak grad:  3.1 mmHg AV Vmean:          105.000 cm/s PV Mean grad:  2.0 mmHg AV VTI:            0.223 m AV Peak Grad:      8.8 mmHg AV Mean Grad:      5.0 mmHg LVOT Vmax:         119.00 cm/s LVOT Vmean:        87.600 cm/s LVOT VTI:          0.215 m LVOT/AV VTI ratio: 0.96  AORTA Ao Root diam: 3.00 cm MITRAL VALVE MV Area (PHT): 7.37 cm     SHUNTS MV Peak grad:  24.2 mmHg    Systemic VTI:  0.22 m MV Mean grad:  12.0 mmHg    Systemic Diam: 2.30 cm MV Vmax:       2.46 m/s MV Vmean:      159.0 cm/s MV Decel Time: 103 msec MV E velocity: 153.00 cm/s MV A  velocity: 227.00 cm/s MV E/A ratio:  0.67 Harold Hedge MD Electronically signed by Harold Hedge MD Signature Date/Time: 05/20/2019/1:21:38 PM    Final         Scheduled Meds: . ascorbic acid  500 mg Oral BID  . aspirin EC  325 mg Oral Daily  . collagenase   Topical Daily  . fentaNYL      . gabapentin  300 mg Oral TID  .  insulin aspart  0-15 Units Subcutaneous TID WC  . insulin glargine  15 Units Subcutaneous Daily  . lamoTRIgine  100 mg Oral BID  . levothyroxine  125 mcg Oral Daily  . midazolam      . multivitamin with minerals  1 tablet Oral Daily  . mupirocin ointment  1 application Nasal BID  . pneumococcal 23 valent vaccine  0.5 mL Intramuscular Tomorrow-1000  . potassium chloride  40 mEq Oral Once  . Ensure Max Protein  11 oz Oral BID BM  . simvastatin  10 mg Oral q1800  . venlafaxine XR  150 mg Oral Q breakfast   Continuous Infusions: . sodium chloride 125 mL/hr at 05/21/19 1040     LOS: 2 days    Time spent: 25 min    Edwin Dada, MD [facility] 05/21/2019, 3:33 PM     Please page though Elmo or Epic secure chat:  For [employer], Adult nurse

## 2019-05-22 DIAGNOSIS — E440 Moderate protein-calorie malnutrition: Secondary | ICD-10-CM

## 2019-05-22 LAB — RHEUMATOID FACTOR: Rheumatoid fact SerPl-aCnc: 13.4 IU/mL (ref 0.0–13.9)

## 2019-05-22 LAB — CBC
HCT: 29.1 % — ABNORMAL LOW (ref 36.0–46.0)
Hemoglobin: 9.9 g/dL — ABNORMAL LOW (ref 12.0–15.0)
MCH: 29.9 pg (ref 26.0–34.0)
MCHC: 34 g/dL (ref 30.0–36.0)
MCV: 87.9 fL (ref 80.0–100.0)
Platelets: 351 10*3/uL (ref 150–400)
RBC: 3.31 MIL/uL — ABNORMAL LOW (ref 3.87–5.11)
RDW: 14.9 % (ref 11.5–15.5)
WBC: 11.4 10*3/uL — ABNORMAL HIGH (ref 4.0–10.5)
nRBC: 0 % (ref 0.0–0.2)

## 2019-05-22 LAB — BASIC METABOLIC PANEL
Anion gap: 8 (ref 5–15)
BUN: 10 mg/dL (ref 6–20)
CO2: 30 mmol/L (ref 22–32)
Calcium: 8.4 mg/dL — ABNORMAL LOW (ref 8.9–10.3)
Chloride: 100 mmol/L (ref 98–111)
Creatinine, Ser: 0.38 mg/dL — ABNORMAL LOW (ref 0.44–1.00)
GFR calc Af Amer: >60 mL/min (ref >60)
GFR calc non Af Amer: >60 mL/min (ref >60)
Glucose, Bld: 160 mg/dL — ABNORMAL HIGH (ref 70–99)
Potassium: 4.1 mmol/L (ref 3.5–5.1)
Sodium: 138 mmol/L (ref 135–145)

## 2019-05-22 LAB — GLUCOSE, CAPILLARY
Glucose-Capillary: 151 mg/dL — ABNORMAL HIGH (ref 70–99)
Glucose-Capillary: 164 mg/dL — ABNORMAL HIGH (ref 70–99)
Glucose-Capillary: 162 mg/dL — ABNORMAL HIGH (ref 70–99)
Glucose-Capillary: 208 mg/dL — ABNORMAL HIGH (ref 70–99)
Glucose-Capillary: 103 mg/dL — ABNORMAL HIGH (ref 70–99)

## 2019-05-22 LAB — RPR: RPR Ser Ql: NONREACTIVE

## 2019-05-22 MED ORDER — MELATONIN 5 MG PO TABS
5.0000 mg | ORAL_TABLET | Freq: Every evening | ORAL | Status: DC | PRN
  Administered 2019-05-22 – 2019-05-26 (×5): 5 mg via ORAL
  Filled 2019-05-22 (×6): qty 1

## 2019-05-22 MED ORDER — DOXYLAMINE SUCCINATE (SLEEP) 25 MG PO TABS
25.0000 mg | ORAL_TABLET | Freq: Every evening | ORAL | Status: DC | PRN
  Filled 2019-05-22: qty 1

## 2019-05-22 NOTE — Progress Notes (Signed)
Patient confused and anxious this shift, patient could not sleep, melatonin given, ativan PRN given with improvement.

## 2019-05-22 NOTE — Progress Notes (Signed)
  Speech Language Pathology Treatment: Cognitive-Linquistic  Patient Details Name: Alicia Andrade MRN: [id number] DOB: [birth year] Today's Date: 05/22/2019 Time: 4403-4742 SLP Time Calculation (min) (ACUTE ONLY): 30 min  Assessment / Plan / Recommendation Clinical Impression  Skilled treatment session targeted pt's overall confusion with current situation. Pt's sister was present and provided cues to help pt re-orient to present situation. Pt's sister is very effective in providing cues that redirects pt. Pt is also very receptive and calm when sister provides this redirection. At this time, formal cognitive communication therapy is best suited for a less chaotic environment that has structure and increased familiarity. Although pt continues to present with severe cognitive communication impairments, will defer ST services to next venue of care. Pt's sister is a valuable part of pt's cognitive recovery and will provide support during acute setting and at next venue of care.    HPI HPI: Alicia Andrade is an 59 y.o. female who has a history of stroke (affecting speech), BPD, DM, hypothyroidism and thrombocytopenia who had a fall in April.  Pt was hospitalized at [facility] for her fall and she was found to have blood sugars over 600.  During this hospitalization pt was diagnosed with right hip fracture. This hospitalization was prolonged but patient was discharged to rehab on at the end of April.  About 5 days prior to discharge from [facility], sister noted that the patient was confused and was told it was hospital delirium. Pt discharged to [facility] for rehab and on 05/18/2019 pt sustained trauma to the back of her head from a fall. She presented to [facility] on 05/29/2019 with complaints of altered mental status, vomiting, and falls. 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 She had been having some difficulty with learning new techniques and  processes at work according to her sister. Over the past 2 years the patient had lost 120 lbs. During this time she had stopped taking insulin as she felt that she no longer needed it. It appears from pharmacy's medication reconciliation that her glucoses were neither followed or treated at [facility] center. MRI brain demonstrated no infarct as was suggested by CT, but a cerebral contusion instead.      SLP Plan  Discharge SLP treatment due to (comment)(pt has appropriate support at this time, defer to next venue)       Recommendations   SNF                Follow up Recommendations: Skilled Nursing facility SLP Visit Diagnosis: Cognitive communication deficit (R41.841) Plan: Discharge SLP treatment due to (comment)(pt has appropriate support at this time, defer to next venue)       GO             Alicia Andrade B. Dreama Saa M.S., CCC-SLP, [facility] Speech-Language Pathologist Rehabilitation Services Office [phone]    Reuel Derby 05/22/2019, 8:19 AM

## 2019-05-22 NOTE — Progress Notes (Signed)
Date of Admission:  05/19/2019      Subjective: Patient doing better Eating dinner  Medications:  . ascorbic acid  500 mg Oral BID  . aspirin EC  325 mg Oral Daily  . collagenase   Topical Daily  . gabapentin  300 mg Oral TID  . insulin aspart  0-15 Units Subcutaneous TID WC  . insulin glargine  15 Units Subcutaneous Daily  . lamoTRIgine  100 mg Oral BID  . levothyroxine  125 mcg Oral Daily  . multivitamin with minerals  1 tablet Oral Daily  . mupirocin ointment  1 application Nasal BID  . Ensure Max Protein  11 oz Oral BID BM  . simvastatin  10 mg Oral q1800  . venlafaxine XR  150 mg Oral Q breakfast    Objective: Vital signs in last 24 hours: Temp:  [97.7 F (36.5 C)-98.9 F (37.2 C)] 98.1 F (36.7 C) (05/13 1544) Pulse Rate:  [100-105] 100 (05/13 0437) Resp:  [13-19] 14 (05/13 1544) BP: (111-154)/(62-84) 134/77 (05/13 1544) SpO2:  [95 %-100 %] 95 % (05/13 1544)  PHYSICAL EXAM:  General: Alert, cooperative, chronically ill, temporal wasting  Lab Results Recent Labs    05/21/19 0400 05/22/19 0501  WBC 11.5* 11.4*  HGB 10.2* 9.9*  HCT 30.9* 29.1*  NA 136 138  K 3.2* 4.1  CL 96* 100  CO2 30 30  BUN 12 10  CREATININE <0.30* 0.38*   Liver Panel No results for input(s): PROT, ALBUMIN, AST, ALT, ALKPHOS, BILITOT, BILIDIR, IBILI in the last 72 hours. Sedimentation Rate Recent Labs    05/21/19 0400  ESRSEDRATE 126*   C-Reactive Protein Recent Labs    05/21/19 0400  CRP 5.4*    Microbiology:  Studies/Results: CT ASPIRATION  Result Date: 05/21/2019 INDICATION: LUMBAR SPINE OSTEO MYELITIS/DISCITIS, SMALL PARASPINOUS ABSCESSES EXAM: CT ASPIRATION LEFT L3-4 PARASPINOUS ABSCESS CT ASPIRATION RIGHT L3-4 PARASPINOUS ABSCESS MEDICATIONS: The patient is currently admitted to the hospital and receiving intravenous antibiotics. The antibiotics were administered within an appropriate time frame prior to the initiation of the procedure. ANESTHESIA/SEDATION:  Fentanyl 25 mcg IV; Versed 0.5 mg IV Moderate Sedation Time:  15 MINUTES The patient was continuously monitored during the procedure by the interventional radiology nurse under my direct supervision. COMPLICATIONS: None immediate. PROCEDURE: Informed written consent was obtained from the patient's power of attorney after a thorough discussion of the procedural risks, benefits and alternatives. All questions were addressed. Maximal Sterile Barrier Technique was utilized including caps, mask, sterile gowns, sterile gloves, sterile drape, hand hygiene and skin antiseptic. A timeout was performed prior to the initiation of the procedure. Previous imaging reviewed. Patient position prone. Noncontrast localization CT performed. 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 CT aspiration right paraspinous abscess: Under sterile conditions and local anesthesia, an 18 gauge 10 cm access needle was advanced into the right paraspinous abscess. Needle position confirmed with CT. Syringe aspiration yielded only scant bloody fluid. Sample sent for culture. Needle removed. CT aspiration left paraspinous abscess: Also under sterile conditions and local anesthesia, an 18 gauge 10 cm access needle was advanced from a posterior approach to the left paraspinous abscess. Needle position confirmed with CT. Syringe aspiration yielded scant exudative fluid sample sent for culture. Needle removed. Postprocedure imaging demonstrates no hemorrhage or hematoma. Patient tolerated the aspirations well. 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 Electronically Signed   By:  M.  Shick M.D.   On: 05/21/2019 12:10   CT ASPIRATION  Result Date: 05/21/2019 INDICATION: LUMBAR SPINE OSTEO MYELITIS/DISCITIS, SMALL PARASPINOUS ABSCESSES EXAM: CT ASPIRATION LEFT L3-4 PARASPINOUS ABSCESS CT ASPIRATION  RIGHT L3-4 PARASPINOUS ABSCESS MEDICATIONS: The patient is currently admitted to the hospital and receiving intravenous antibiotics. The antibiotics were administered within an appropriate time frame prior to the initiation of the procedure. ANESTHESIA/SEDATION: Fentanyl 25 mcg IV; Versed 0.5 mg IV Moderate Sedation Time:  15 MINUTES The patient was continuously monitored during the procedure by the interventional radiology nurse under my direct supervision. COMPLICATIONS: None immediate. PROCEDURE: Informed written consent was obtained from the patient's power of attorney after a thorough discussion of the procedural risks, benefits and alternatives. All questions were addressed. Maximal Sterile Barrier Technique was utilized including caps, mask, sterile gowns, sterile gloves, sterile drape, hand hygiene and skin antiseptic. A timeout was performed prior to the initiation of the procedure. Previous imaging reviewed. Patient position prone. Noncontrast localization CT performed. 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 CT aspiration right paraspinous abscess: Under sterile conditions and local anesthesia, an 18 gauge 10 cm access needle was advanced into the right paraspinous abscess. Needle position confirmed with CT. Syringe aspiration yielded only scant bloody fluid. Sample sent for culture. Needle removed. CT aspiration left paraspinous abscess: Also under sterile conditions and local anesthesia, an 18 gauge 10 cm access needle was advanced from a posterior approach to the left paraspinous abscess. Needle position confirmed with CT. Syringe aspiration yielded scant exudative fluid sample sent for culture. Needle removed. Postprocedure imaging demonstrates no hemorrhage or hematoma. Patient tolerated the aspirations well. IMPRESSION: Successful CT aspirations of the bilateral paraspinous abscesses at the L3-4 spondylo  discitis. Electronically Signed   By: Jerilynn Mages.  Shick M.D.   On: 05/21/2019 12:10     Assessment/Plan:   Low back pain.  MRI shows changes in L3-L4 disc and also bilateral psoas collection.  Need to rule out discitis and psoas abscess. Culture from both fluid is showing gram neg rod- contacted lab this evening --  they are unable to tell what the ID is today- will find out tomorrow- Do not want to prematurely start antibiotic in case the culture does not yield an organism. In that case she will need further biopsy or Next generation sequencing  As patient clinically stable and not septic will not start antibiotics until a microbiological diagnosis is established  She also had E. coli bacteremia in April 2021 at the previous hospital. .  Diabetes mellitus poorly controlled.  In April 2020 hemoglobin was more than 14 but now it is 8.8.  Hypokalemia, hyponatremia and hypomagnesemia ? Weight loss and malnutrition.  Unclear etiology Rule out malignancy.  History of frequent falls ?neuropathy  Encephalopathy and memory loss  History of's CVA.  Currently MRI brain shows left frontal hemorrhagic contusion  History of rheumatoid arthritis not on any medication.  Rheumatoid factor within range  If blood culture becomes  positive will need TEE  Discussed  the management with patient, sister and Hospitalist

## 2019-05-22 NOTE — Progress Notes (Addendum)
Subjective: Per sister remains altered.  More confused at night.    Objective: Current vital signs: BP (!) 154/78 (BP Location: Left Arm)   Pulse 100   Temp 97.7 F (36.5 C) (Oral)   Resp 13   Ht 5\' 2"  (1.575 m)   Wt 57.5 kg   SpO2 100%   BMI 23.19 kg/m  Vital signs in last 24 hours: Temp:  [97 F (36.1 C)-98.9 F (37.2 C)] 97.7 F (36.5 C) (05/13 0736) Pulse Rate:  [96-105] 100 (05/13 0437) Resp:  [10-22] 13 (05/13 0736) BP: (111-175)/(62-88) 154/78 (05/13 0736) SpO2:  [96 %-100 %] 100 % (05/13 0736)  Intake/Output from previous day: 05/12 0701 - 05/13 0700 In: 360 [P.O.:360] Out: 2150 [Urine:2150] Intake/Output this shift: No intake/output data recorded. Nutritional status:  Diet Order            Diet heart healthy/carb modified Room service appropriate? Yes; Fluid consistency: Thin  Diet effective now              Neurologic Exam: Mental Status: Alert, oriented to person and place.  Speech fluent without evidence of aphasia.  Able to follow 3 step commands without difficulty. Cranial Nerves: II: Visual fields grossly normal III,IV, VI: ptosis not present, extra-ocular motions intact bilaterally V,VII: smile symmetric, facial light touch sensation normal bilaterally VIII: hearing normal bilaterally IX,X: gag reflex present XI: bilateral shoulder shrug XII: midline tongue extension Motor: Continued decreased ROM in the LUE.  Unable to lift the RLE off the bed  Sensory: Pinprick and light touch decreased in the LLE  Lab Results: Basic Metabolic Panel: Recent Labs  Lab 05/19/19 1153 05/19/19 1153 05/19/19 2045 05/19/19 2045 05/20/19 0340 05/20/19 0340 05/20/19 0837 05/20/19 1733 05/21/19 0400 05/22/19 0501  NA 134*   < > 135  --  136  --   --  137 136 138  K 2.6*   < > 2.9*  --  2.9*  --   --  3.6 3.2* 4.1  CL 91*   < > 93*  --  95*  --   --  96* 96* 100  CO2 29   < > 29  --  31  --   --  32 30 30  GLUCOSE 242*   < > 184*  --  257*  --   --   174* 168* 160*  BUN 36*   < > 29*  --  27*  --   --  15 12 10   CREATININE 0.45   < > 0.40*  --  0.43*  --   --  0.36* <0.30* 0.38*  CALCIUM 8.8*   < > 8.5*   < > 8.4*   < >  --  8.7* 8.5* 8.4*  MG 1.7  --   --   --   --   --  1.4*  --  1.9  --    < > = values in this interval not displayed.    Liver Function Tests: Recent Labs  Lab 05/19/19 1153  AST 15  ALT 10  ALKPHOS 161*  BILITOT 1.1  PROT 8.2*  ALBUMIN 3.0*   No results for input(s): LIPASE, AMYLASE in the last 168 hours. No results for input(s): AMMONIA in the last 168 hours.  CBC: Recent Labs  Lab 05/19/19 1153 05/20/19 0340 05/21/19 0400 05/22/19 0501  WBC 12.7* 14.1* 11.5* 11.4*  NEUTROABS 11.5* 12.0* 9.6*  --   HGB 11.0* 9.9* 10.2* 9.9*  HCT 32.3* 28.9* 30.9*  29.1*  MCV 88.7 86.0 88.8 87.9  PLT 349 327 382 351    Cardiac Enzymes: No results for input(s): CKTOTAL, CKMB, CKMBINDEX, TROPONINI in the last 168 hours.  Lipid Panel: Recent Labs  Lab 05/20/19 0340  CHOL 174  TRIG 140  HDL 37*  CHOLHDL 4.7  VLDL 28  LDLCALC 109*    CBG: Recent Labs  Lab 05/21/19 0828 05/21/19 1138 05/21/19 1642 05/21/19 2102 05/22/19 0734  GLUCAP 176* 184* 192* 151* 164*    Microbiology: Results for orders placed or performed during the hospital encounter of 05/19/19  SARS Coronavirus 2 by RT PCR (hospital order, performed in [facility] hospital lab) Nasopharyngeal Nasopharyngeal Swab     Status: None   Collection Time: 05/19/19  1:47 PM   Specimen: Nasopharyngeal Swab  Result Value Ref Range Status   SARS Coronavirus 2 NEGATIVE NEGATIVE Final    Comment: (NOTE) SARS-CoV-2 target nucleic acids are NOT DETECTED. The SARS-CoV-2 RNA is generally detectable in upper and lower respiratory specimens during the acute phase of infection. The lowest concentration of SARS-CoV-2 viral copies this assay can detect is 250 copies / mL. A negative result does not preclude SARS-CoV-2 infection and should not be used as  the sole basis for treatment or other patient management decisions.  A negative result may occur with improper specimen collection / handling, submission of specimen other than nasopharyngeal swab, presence of viral mutation(s) within the areas targeted by this assay, and inadequate number of viral copies (<250 copies / mL). A negative result must be combined with clinical observations, patient history, and epidemiological information. Fact Sheet for Patients:   StrictlyIdeas.no Fact Sheet for Healthcare Providers: BankingDealers.co.za This test is not yet approved or cleared  by the [place] FDA and has been authorized for detection and/or diagnosis of SARS-CoV-2 by FDA under an Emergency Use Authorization (EUA).  This EUA will remain in effect (meaning this test can be used) for the duration of the COVID-19 declaration under Section 564(b)(1) of the Act, 21 U.S.C. section 360bbb-3(b)(1), unless the authorization is terminated or revoked sooner. Performed at [facility], [address] [place], [place] [address]   CULTURE, BLOOD (ROUTINE X 2) w Reflex to ID Panel     Status: None (Preliminary result)   Collection Time: 05/20/19  9:33 AM   Specimen: BLOOD  Result Value Ref Range Status   Specimen Description BLOOD RIGHT ANTECUBITAL  Final   Special Requests   Final    BOTTLES DRAWN AEROBIC AND ANAEROBIC Blood Culture adequate volume   Culture   Final    NO GROWTH 2 DAYS Performed at [facility], [address] [place], [place] [address]    Report Status PENDING  Incomplete  CULTURE, BLOOD (ROUTINE X 2) w Reflex to ID Panel     Status: None (Preliminary result)   Collection Time: 05/20/19  9:43 AM   Specimen: BLOOD  Result Value Ref Range Status   Specimen Description BLOOD RIGHT WRIST  Final   Special Requests   Final    BOTTLES DRAWN AEROBIC AND ANAEROBIC Blood Culture adequate volume   Culture   Final     NO GROWTH 2 DAYS Performed at [facility], [address], [place], [place] [address]    Report Status PENDING  Incomplete  MRSA PCR Screening     Status: None   Collection Time: 05/20/19  8:31 PM   Specimen: Nasopharyngeal  Result Value Ref Range Status   MRSA by  PCR NEGATIVE NEGATIVE Final    Comment:        The GeneXpert MRSA Assay (FDA approved for NASAL specimens only), is one component of a comprehensive MRSA colonization surveillance program. It is not intended to diagnose MRSA infection nor to guide or monitor treatment for MRSA infections. Performed at [facility], [address] [place], [place] [address]   Aerobic/Anaerobic Culture (surgical/deep wound)     Status: None (Preliminary result)   Collection Time: 05/21/19  9:58 AM   Specimen: Abscess  Result Value Ref Range Status   Specimen Description   Final    ABSCESS Performed at [facility], [address], [place], [place] [address]    Special Requests   Final    NONE Performed at [facility], [address] [place], [place] [address]    Gram Stain   Final    NO WBC SEEN RARE GRAM POSITIVE COCCI Performed at [facility], [address] [place], [place] [address]    Culture PENDING  Incomplete   Report Status PENDING  Incomplete  Aerobic/Anaerobic Culture (surgical/deep wound)     Status: None (Preliminary result)   Collection Time: 05/21/19 10:01 AM   Specimen: Abscess  Result Value Ref Range Status   Specimen Description   Final    ABSCESS Performed at [facility], [address], [place], [place] [address]    Special Requests   Final    NONE Performed at [facility], [address] [place], [place] [address]    Gram Stain   Final    FEW WBC PRESENT,BOTH PMN AND MONONUCLEAR NO ORGANISMS SEEN Performed at [facility], [address], [place], [place] [address]    Culture PENDING  Incomplete   Report Status  PENDING  Incomplete    Coagulation Studies: Recent Labs    05/19/19 1153  LABPROT 14.2  INR 1.1    Imaging: CT ASPIRATION  Result Date: 05/21/2019 INDICATION: LUMBAR SPINE OSTEO MYELITIS/DISCITIS, SMALL PARASPINOUS ABSCESSES EXAM: CT ASPIRATION LEFT L3-4 PARASPINOUS ABSCESS CT ASPIRATION RIGHT L3-4 PARASPINOUS ABSCESS MEDICATIONS: The patient is currently admitted to the hospital and receiving intravenous antibiotics. The antibiotics were administered within an appropriate time frame prior to the initiation of the procedure. ANESTHESIA/SEDATION: Fentanyl 25 mcg IV; Versed 0.5 mg IV Moderate Sedation Time:  15 MINUTES The patient was continuously monitored during the procedure by the interventional radiology nurse under my direct supervision. COMPLICATIONS: None immediate. PROCEDURE: Informed written consent was obtained from the patient's power of attorney after a thorough discussion of the procedural risks, benefits and alternatives. All questions were addressed. Maximal Sterile Barrier Technique was utilized including caps, mask, sterile gowns, sterile gloves, sterile drape, hand hygiene and skin antiseptic. A timeout was performed prior to the initiation of the procedure. Previous imaging reviewed. Patient position prone. Noncontrast localization CT performed. 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 CT aspiration right paraspinous abscess: Under sterile conditions and local anesthesia, an 18 gauge 10 cm access needle was advanced into the right paraspinous abscess. Needle position confirmed with CT. Syringe aspiration yielded only scant bloody fluid. Sample sent for culture. Needle removed. CT aspiration left paraspinous abscess: Also under sterile conditions and local anesthesia, an 18 gauge 10 cm access needle was advanced from a posterior approach to the left paraspinous abscess. Needle position  confirmed with CT. Syringe aspiration yielded scant exudative fluid sample  sent for culture. Needle removed. Postprocedure imaging demonstrates no hemorrhage or hematoma. Patient tolerated the aspirations well. 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 Electronically Signed   By: Judie Petit.  Shick M.D.   On: 05/21/2019 12:10   CT ASPIRATION  Result Date: 05/21/2019 INDICATION: LUMBAR SPINE OSTEO MYELITIS/DISCITIS, SMALL PARASPINOUS ABSCESSES EXAM: CT ASPIRATION LEFT L3-4 PARASPINOUS ABSCESS CT ASPIRATION RIGHT L3-4 PARASPINOUS ABSCESS MEDICATIONS: The patient is currently admitted to the hospital and receiving intravenous antibiotics. The antibiotics were administered within an appropriate time frame prior to the initiation of the procedure. ANESTHESIA/SEDATION: Fentanyl 25 mcg IV; Versed 0.5 mg IV Moderate Sedation Time:  15 MINUTES The patient was continuously monitored during the procedure by the interventional radiology nurse under my direct supervision. COMPLICATIONS: None immediate. PROCEDURE: Informed written consent was obtained from the patient's power of attorney after a thorough discussion of the procedural risks, benefits and alternatives. All questions were addressed. Maximal Sterile Barrier Technique was utilized including caps, mask, sterile gowns, sterile gloves, sterile drape, hand hygiene and skin antiseptic. A timeout was performed prior to the initiation of the procedure. Previous imaging reviewed. Patient position prone. Noncontrast localization CT performed. 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 CT aspiration right paraspinous abscess: Under sterile conditions and local anesthesia, an 18 gauge 10 cm access needle was advanced into the right paraspinous abscess. Needle position confirmed with CT. Syringe aspiration yielded only scant  bloody fluid. Sample sent for culture. Needle removed. CT aspiration left paraspinous abscess: Also under sterile conditions and local anesthesia, an 18 gauge 10 cm access needle was advanced from a posterior approach to the left paraspinous abscess. Needle position confirmed with CT. Syringe aspiration yielded scant exudative fluid sample sent for culture. Needle removed. Postprocedure imaging demonstrates no hemorrhage or hematoma. Patient tolerated the aspirations well. 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 Electronically Signed   By: Judie Petit.  Shick M.D.   On: 05/21/2019 12:10   ECHOCARDIOGRAM COMPLETE  Result Date: 05/20/2019    ECHOCARDIOGRAM REPORT   Patient Name:   JAIDYNN Miler Date of Exam: 05/20/2019 Medical Rec #:  [id number]   Height:       62.0 in Accession #:    [id number]  Weight:       126.8 lb Date of Birth:  18-Jan-1960   BSA:          1.575 m Patient Age:    58 years    BP:           153/78 mmHg Patient Gender: F           HR:           101 bpm. Exam Location:  [facility] Procedure: 2D Echo, Color Doppler, Cardiac Doppler and Saline Contrast Bubble            Study Indications:     I163.9 Stroke  History:         Patient has no prior history of Echocardiogram examinations.                  Risk Factors:Diabetes.  Sonographer:     Humphrey Rolls RDCS (AE) Referring Phys:  [id number] Gerri Lins Diagnosing Phys: Harold Hedge MD IMPRESSIONS  1. Left ventricular ejection fraction, by estimation, is 55 to 60%. The left ventricle has normal function. The left ventricle has no regional wall  motion abnormalities. Left ventricular diastolic parameters are consistent with Grade I diastolic dysfunction (impaired relaxation).  2. Right ventricular systolic function is normal. The right ventricular size is normal.  3. Left atrial size was mildly dilated.  4. The mitral valve is grossly normal. Trivial mitral valve regurgitation. Mild mitral stenosis.  5. The  aortic valve was not well visualized. Aortic valve regurgitation is trivial. Mild to moderate aortic valve sclerosis/calcification is present, without any evidence of aortic stenosis.  6. Aortic dilatation noted. There is borderline dilatation of the aortic root measuring 39 mm.  7. Agitated saline contrast bubble study was negative, with no evidence of any interatrial shunt. FINDINGS  Left Ventricle: Left ventricular ejection fraction, by estimation, is 55 to 60%. The left ventricle has normal function. The left ventricle has no regional wall motion abnormalities. The left ventricular internal cavity size was normal in size. There is  no left ventricular hypertrophy. Left ventricular diastolic parameters are consistent with Grade I diastolic dysfunction (impaired relaxation). Right Ventricle: The right ventricular size is normal. No increase in right ventricular wall thickness. Right ventricular systolic function is normal. Left Atrium: Left atrial size was mildly dilated. Right Atrium: Right atrial size was normal in size. Pericardium: There is no evidence of pericardial effusion. Mitral Valve: The mitral valve is grossly normal. Trivial mitral valve regurgitation. Mild mitral valve stenosis. MV peak gradient, 24.2 mmHg. The mean mitral valve gradient is 12.0 mmHg. Tricuspid Valve: The tricuspid valve is not well visualized. Tricuspid valve regurgitation is trivial. Aortic Valve: The aortic valve was not well visualized. Aortic valve regurgitation is trivial. Mild to moderate aortic valve sclerosis/calcification is present, without any evidence of aortic stenosis. Aortic valve mean gradient measures 5.0 mmHg. Aortic  valve peak gradient measures 8.8 mmHg. Aortic valve area, by VTI measures 4.01 cm. Pulmonic Valve: The pulmonic valve was not well visualized. Pulmonic valve regurgitation is trivial. Aorta: Aortic dilatation noted. There is borderline dilatation of the aortic root measuring 39 mm. IAS/Shunts: The  atrial septum is grossly normal. Agitated saline contrast was given intravenously to evaluate for intracardiac shunting. Agitated saline contrast bubble study was negative, with no evidence of any interatrial shunt.  LEFT VENTRICLE PLAX 2D LVIDd:         4.49 cm  Diastology LVIDs:         3.24 cm  LV e' lateral:   4.35 cm/s LV PW:         0.91 cm  LV E/e' lateral: 35.2 LV IVS:        1.08 cm  LV e' medial:    4.35 cm/s LVOT diam:     2.30 cm  LV E/e' medial:  35.2 LV SV:         89 LV SV Index:   57 LVOT Area:     4.15 cm  RIGHT VENTRICLE RV Basal diam:  3.68 cm LEFT ATRIUM             Index       RIGHT ATRIUM           Index LA diam:        3.60 cm 2.29 cm/m  RA Area:     17.60 cm LA Vol (A2C):   87.0 ml 55.24 ml/m RA Volume:   46.70 ml  29.65 ml/m LA Vol (A4C):   68.8 ml 43.69 ml/m LA Biplane Vol: 79.4 ml 50.42 ml/m  AORTIC VALVE  PULMONIC VALVE AV Area (Vmax):    3.34 cm     PV Vmax:       0.88 m/s AV Area (Vmean):   3.47 cm     PV Vmean:      60.800 cm/s AV Area (VTI):     4.01 cm     PV VTI:        0.143 m AV Vmax:           148.00 cm/s  PV Peak grad:  3.1 mmHg AV Vmean:          105.000 cm/s PV Mean grad:  2.0 mmHg AV VTI:            0.223 m AV Peak Grad:      8.8 mmHg AV Mean Grad:      5.0 mmHg LVOT Vmax:         119.00 cm/s LVOT Vmean:        87.600 cm/s LVOT VTI:          0.215 m LVOT/AV VTI ratio: 0.96  AORTA Ao Root diam: 3.00 cm MITRAL VALVE MV Area (PHT): 7.37 cm     SHUNTS MV Peak grad:  24.2 mmHg    Systemic VTI:  0.22 m MV Mean grad:  12.0 mmHg    Systemic Diam: 2.30 cm MV Vmax:       2.46 m/s MV Vmean:      159.0 cm/s MV Decel Time: 103 msec MV E velocity: 153.00 cm/s MV A velocity: 227.00 cm/s MV E/A ratio:  0.67 Harold Hedge MD Electronically signed by Harold Hedge MD Signature Date/Time: 05/20/2019/1:21:38 PM    Final     Medications:  I have reviewed the patient's current medications. Scheduled: . ascorbic acid  500 mg Oral BID  . aspirin EC  325 mg Oral Daily   . collagenase   Topical Daily  . gabapentin  300 mg Oral TID  . insulin aspart  0-15 Units Subcutaneous TID WC  . insulin glargine  15 Units Subcutaneous Daily  . lamoTRIgine  100 mg Oral BID  . levothyroxine  125 mcg Oral Daily  . multivitamin with minerals  1 tablet Oral Daily  . mupirocin ointment  1 application Nasal BID  . pneumococcal 23 valent vaccine  0.5 mL Intramuscular Tomorrow-1000  . Ensure Max Protein  11 oz Oral BID BM  . simvastatin  10 mg Oral q1800  . venlafaxine XR  150 mg Oral Q breakfast    Assessment/Plan: 59 y.o. female who has a history of stroke (affecting speech), BPD, DM, hypothyroidism and thrombocytopenia who presents with altered mental status.  History is complicated by the fact that it blends with a fall and hip fracture that occurred recently as well prior to the development of the mental status changes.  Findings on neurological examination reveal left and right sided abnormalities. MRI of the cervical, thoracic and lumbar spines have been performed and personally reviewed.  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  MRI of the brain and no acute infarcts are noted.  Possible hemorrhagic contusion seen.  MRA of the neck is unremarkable.   AMS likely multifactorial and related to contusion, hospital delirium and infection.    Recommendations: 1. Awaiting organism isolation from CT-guided aspirate 2. Will continue to follow with you   LOS: 3 days   Thana Farr, MD Neurology [phone] 05/22/2019  9:40 AM

## 2019-05-22 NOTE — Progress Notes (Signed)
PROGRESS NOTE    Alicia Andrade  ONG:[id number] DOB: 24-Aug-1960 DOA: 05/19/2019 PCP: Galvin Proffer, MD      Brief Narrative:  Alicia Andrade is a 59 y.o. F with bipolar disorder, HTN, DM, hypothyroidism, and recent femur fracture if with fall, confusion, generalized weakness.  The ER, CT imaging of the back showed findings concerning for discitis versus osteomyelitis.  The patient was admitted to the hospital service.      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  Here, imaging of the brain ruled out stroke, but imaging with MR and contrast of the entire spine showed findings consistent with L3-L4 discitis, as well as L3 and L4 osteomyelitis, bilateral psoas abscess.  Other differential included hematoma, cancer.  Underwent IR guided disc space aspiration 5/12, gram stain with GPCs, culture pending.  Discussed with Dr. Joylene Draft yesterday, given clinical stability, we will hold antibiotics, follow culture data.   If culture negative, will pursue biopsy -Analgesics with oxycodone or morphine as needed  -Follow disc space aspirate culture -Consult infectious disease, appreciate cares  -Follow SPEP, FLC   Delirium Patient given ativan last night, today somewhat disoriented. Likely due to medications, recent procedure, underlying infection. -Hold all benzodiapzepines  Bipolar disorder -Continue lamotrigine, venlafaxine  Recent femur fracture Vitamin D deficiency  Cerebral contusion Initially thought to be related to stroke.  Stroke ruled out with MRI. -Supportive care  Hypothyroidism -Continue levothyroxine  Hypertension BP normal -Continue losartan, HCTZ  Diabetes with Peripheral neuropathy Glucoses well controlled -Continue sliding scale corrections -Continue baby aspirin, simvastatin -Continue Lantus -Continue  gabapentin  Moderate protein calorie malnutrition -Consult dietitian  Pressure injury, sacrum, unstageable, present on admission Pressure injury buttocks, left, stage II, present on admission  Anemia of chronic disease stable, no clinical bleeding  Hypokalemia Resolved             Disposition: Status is: Inpatient  Remains inpatient appropriate because: patient has serious and life-threatening infection of the back, this requires ongoing work up only can be perofrmed in hospital, to define definitive therapy   Dispo: The patient is from: Home              [facility]ticipated [facility]              Anticipated d/c date is: > 3 days              Patient currently is not medically stable to d/c.    The patient had a disc aspirated 5/12.  Culture pending.  If the culture is growing bacteria, we will start IV antibiotics, place PICC, plan for long-term IV antibiotics under the direction of infectious disease.           MDM: The below labs and imaging reports reviewed and summarized above.  Medication management as above.    DVT prophylaxis: SCD Code Status: Full code Family Communication: Sister at the bedside    Consultants:   Infectious disease  IR  Neurosurgery  Procedures:   5/12 CT-guided disc space aspiration  Antimicrobials:      Culture data:   5/11 blood culture -- ng  5/12 disc aspirate -- GPC on gram stain, culture pending          Subjective: Patient disoriented and uncooperative overnight.  Calm but disoriented this morning.  No fever.  No tachycardia or respiratory distress.  No vomitng,  leg weakness.       Objective: Vitals:   05/21/19 2011 05/22/19 0000 05/22/19 0437 05/22/19 0736  BP: 111/62 (!) 145/84 124/73 (!) 154/78  Pulse: (!) 105 (!) 103 100   Resp: 14 19 19 13   Temp: 98 F (36.7 C) 98.9 F (37.2 C) 97.8 F (36.6 C) 97.7 F (36.5 C)  TempSrc: Oral Oral Oral Oral  SpO2: 97% 97% 96% 100%   Weight:      Height:        Intake/Output Summary (Last 24 hours) at 05/22/2019 0851 Last data filed at 05/22/2019 0031 Gross per 24 hour  Intake 360 ml  Output 2150 ml  Net -1790 ml   Filed Weights   05/19/19 1138 05/19/19 1718  Weight: 56.7 kg 57.5 kg    Examination: General appearance: adult female, lying in bed, no acute distress     HEENT: anicteric, conjunctiva lids and lashes normal, nose normal, no deformity, epistaxis, lips dry, dentition normal, OP moist, no oral lesions, hearing normal   Skin: no rashes Cardiac: RRR, no murmurs, normal JVP, no LE edema  Respiratory: Respiratory rate normal, no rales or wheezing Abdomen: No TTP or guarding, no distension or HSM  MSK: Normal muscle bulk and tone Neuro: EOMI, leg movement appears symmetric, limited by pain only, sensation intact to light touch, bladder control normal   Psych: Disoriented, oriented to self and "hospital" only, but not to situation.  Attention seems diminised, affect pleasant    Data Reviewed: I have personally reviewed following labs and imaging studies:  CBC: Recent Labs  Lab 05/19/19 1153 05/20/19 0340 05/21/19 0400 05/22/19 0501  WBC 12.7* 14.1* 11.5* 11.4*  NEUTROABS 11.5* 12.0* 9.6*  --   HGB 11.0* 9.9* 10.2* 9.9*  HCT 32.3* 28.9* 30.9* 29.1*  MCV 88.7 86.0 88.8 87.9  PLT 349 327 382 351   Basic Metabolic Panel: Recent Labs  Lab 05/19/19 1153 05/19/19 1153 05/19/19 2045 05/20/19 0340 05/20/19 0837 05/20/19 1733 05/21/19 0400 05/22/19 0501  NA 134*   < > 135 136  --  137 136 138  K 2.6*   < > 2.9* 2.9*  --  3.6 3.2* 4.1  CL 91*   < > 93* 95*  --  96* 96* 100  CO2 29   < > 29 31  --  32 30 30  GLUCOSE 242*   < > 184* 257*  --  174* 168* 160*  BUN 36*   < > 29* 27*  --  15 12 10   CREATININE 0.45   < > 0.40* 0.43*  --  0.36* <0.30* 0.38*  CALCIUM 8.8*   < > 8.5* 8.4*  --  8.7* 8.5* 8.4*  MG 1.7  --   --   --  1.4*  --  1.9  --    < > = values in this interval not displayed.    GFR: Estimated Creatinine Clearance: 59.9 mL/min (A) (by C-G formula based on SCr of 0.38 mg/dL (L)). Liver Function Tests: Recent Labs  Lab 05/19/19 1153  AST 15  ALT 10  ALKPHOS 161*  BILITOT 1.1  PROT 8.2*  ALBUMIN 3.0*   No results for input(s): LIPASE, AMYLASE in the last 168 hours. No results for input(s): AMMONIA in the last 168 hours. Coagulation Profile: Recent Labs  Lab 05/19/19 1153  INR 1.1   Cardiac Enzymes: No results for input(s): CKTOTAL, CKMB, CKMBINDEX, TROPONINI in the last 168 hours. BNP (last 3 results) No results for input(s):  PROBNP in the last 8760 hours. HbA1C: Recent Labs    05/20/19 0340  HGBA1C 8.8*   CBG: Recent Labs  Lab 05/21/19 0828 05/21/19 1138 05/21/19 1642 05/21/19 2102 05/22/19 0734  GLUCAP 176* 184* 192* 151* 164*   Lipid Profile: Recent Labs    05/20/19 0340  CHOL 174  HDL 37*  LDLCALC 109*  TRIG 140  CHOLHDL 4.7   Thyroid Function Tests: No results for input(s): TSH, T4TOTAL, FREET4, T3FREE, THYROIDAB in the last 72 hours. Anemia Panel: Recent Labs    05/20/19 1734  VITAMINB12 435  FOLATE 8.8   Urine analysis:    Component Value Date/Time   COLORURINE YELLOW (A) 05/20/2019 1500   APPEARANCEUR TURBID (A) 05/20/2019 1500   LABSPEC 1.004 (L) 05/20/2019 1500   PHURINE 5.0 05/20/2019 1500   GLUCOSEU NEGATIVE 05/20/2019 1500   HGBUR MODERATE (A) 05/20/2019 1500   BILIRUBINUR NEGATIVE 05/20/2019 1500   KETONESUR NEGATIVE 05/20/2019 1500   PROTEINUR NEGATIVE 05/20/2019 1500   NITRITE NEGATIVE 05/20/2019 1500   LEUKOCYTESUR LARGE (A) 05/20/2019 1500   Sepsis Labs: (procalcitonin:4,lacticacidven:4)  ) Recent Results (from the past 240 hour(s))  SARS Coronavirus 2 by RT PCR (hospital order, performed in [facility] hospital lab) Nasopharyngeal Nasopharyngeal Swab     Status: None   Collection Time: 05/19/19  1:47 PM   Specimen: Nasopharyngeal Swab  Result Value Ref Range Status   SARS  Coronavirus 2 NEGATIVE NEGATIVE Final    Comment: (NOTE) SARS-CoV-2 target nucleic acids are NOT DETECTED. The SARS-CoV-2 RNA is generally detectable in upper and lower respiratory specimens during the acute phase of infection. The lowest concentration of SARS-CoV-2 viral copies this assay can detect is 250 copies / mL. A negative result does not preclude SARS-CoV-2 infection and should not be used as the sole basis for treatment or other patient management decisions.  A negative result may occur with improper specimen collection / handling, submission of specimen other than nasopharyngeal swab, presence of viral mutation(s) within the areas targeted by this assay, and inadequate number of viral copies (<250 copies / mL). A negative result must be combined with clinical observations, patient history, and epidemiological information. Fact Sheet for Patients:   BoilerBrush.com.cy Fact Sheet for Healthcare Providers: https://pope.com/ This test is not yet approved or cleared  by the [place] FDA and has been authorized for detection and/or diagnosis of SARS-CoV-2 by FDA under an Emergency Use Authorization (EUA).  This EUA will remain in effect (meaning this test can be used) for the duration of the COVID-19 declaration under Section 564(b)(1) of the Act, 21 U.S.C. section 360bbb-3(b)(1), unless the authorization is terminated or revoked sooner. Performed at [facility], [address] [place], [place] [address]   CULTURE, BLOOD (ROUTINE X 2) w Reflex to ID Panel     Status: None (Preliminary result)   Collection Time: 05/20/19  9:33 AM   Specimen: BLOOD  Result Value Ref Range Status   Specimen Description BLOOD RIGHT ANTECUBITAL  Final   Special Requests   Final    BOTTLES DRAWN AEROBIC AND ANAEROBIC Blood Culture adequate volume   Culture   Final    NO GROWTH 2 DAYS Performed at [facility], [address] [place], [place] [address]    Report Status PENDING  Incomplete  CULTURE, BLOOD (ROUTINE X 2) w Reflex to ID Panel     Status: None (Preliminary result)   Collection Time: 05/20/19  9:43 AM   Specimen: BLOOD  Result  Value Ref Range Status   Specimen Description BLOOD RIGHT WRIST  Final   Special Requests   Final    BOTTLES DRAWN AEROBIC AND ANAEROBIC Blood Culture adequate volume   Culture   Final    NO GROWTH 2 DAYS Performed at [facility], [address] [place], [place] [address]    Report Status PENDING  Incomplete  MRSA PCR Screening     Status: None   Collection Time: 05/20/19  8:31 PM   Specimen: Nasopharyngeal  Result Value Ref Range Status   MRSA by PCR NEGATIVE NEGATIVE Final    Comment:        The GeneXpert MRSA Assay (FDA approved for NASAL specimens only), is one component of a comprehensive MRSA colonization surveillance program. It is not intended to diagnose MRSA infection nor to guide or monitor treatment for MRSA infections. Performed at [facility], [address] [place], [place] [address]   Aerobic/Anaerobic Culture (surgical/deep wound)     Status: None (Preliminary result)   Collection Time: 05/21/19  9:58 AM   Specimen: Abscess  Result Value Ref Range Status   Specimen Description   Final    ABSCESS Performed at [facility], [address], [place], [place] [address]    Special Requests   Final    NONE Performed at [facility], [address] [place], [place] [address]    Gram Stain   Final    NO WBC SEEN RARE GRAM POSITIVE COCCI Performed at [facility], [address] [place], [place] [address]    Culture PENDING  Incomplete   Report Status PENDING  Incomplete  Aerobic/Anaerobic Culture (surgical/deep wound)     Status: None (Preliminary result)   Collection Time: 05/21/19 10:01 AM   Specimen: Abscess  Result Value Ref Range Status   Specimen Description   Final     ABSCESS Performed at [facility], [address] [place], [place] [address]    Special Requests   Final    NONE Performed at [facility], [address] [place], [place] [address]    Gram Stain   Final    FEW WBC PRESENT,BOTH PMN AND MONONUCLEAR NO ORGANISMS SEEN Performed at [facility], [address] [place], [place] [address]    Culture PENDING  Incomplete   Report Status PENDING  Incomplete         Radiology Studies: CT ASPIRATION  Result Date: 05/21/2019 INDICATION: LUMBAR SPINE OSTEO MYELITIS/DISCITIS, SMALL PARASPINOUS ABSCESSES EXAM: CT ASPIRATION LEFT L3-4 PARASPINOUS ABSCESS CT ASPIRATION RIGHT L3-4 PARASPINOUS ABSCESS MEDICATIONS: The patient is currently admitted to the hospital and receiving intravenous antibiotics. The antibiotics were administered within an appropriate time frame prior to the initiation of the procedure. ANESTHESIA/SEDATION: Fentanyl 25 mcg IV; Versed 0.5 mg IV Moderate Sedation Time:  15 MINUTES The patient was continuously monitored during the procedure by the interventional radiology nurse under my direct supervision. COMPLICATIONS: None immediate. PROCEDURE: Informed written consent was obtained from the patient's power of attorney after a thorough discussion of the procedural risks, benefits and alternatives. All questions were addressed. Maximal Sterile Barrier Technique was utilized including caps, mask, sterile gowns, sterile gloves, sterile drape, hand hygiene and skin antiseptic. A timeout was performed prior to the initiation of the procedure. Previous imaging reviewed. Patient position prone. Noncontrast localization CT performed. 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 CT  aspiration right paraspinous abscess: Under sterile conditions and local anesthesia, an 18 gauge 10 cm access needle was advanced into the right  paraspinous abscess. Needle position confirmed with CT. Syringe aspiration yielded only scant bloody fluid. Sample sent for culture. Needle removed. CT aspiration left paraspinous abscess: Also under sterile conditions and local anesthesia, an 18 gauge 10 cm access needle was advanced from a posterior approach to the left paraspinous abscess. Needle position confirmed with CT. Syringe aspiration yielded scant exudative fluid sample sent for culture. Needle removed. Postprocedure imaging demonstrates no hemorrhage or hematoma. Patient tolerated the aspirations well. 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 Electronically Signed   By: Judie Petit.  Shick M.D.   On: 05/21/2019 12:10   CT ASPIRATION  Result Date: 05/21/2019 INDICATION: LUMBAR SPINE OSTEO MYELITIS/DISCITIS, SMALL PARASPINOUS ABSCESSES EXAM: CT ASPIRATION LEFT L3-4 PARASPINOUS ABSCESS CT ASPIRATION RIGHT L3-4 PARASPINOUS ABSCESS MEDICATIONS: The patient is currently admitted to the hospital and receiving intravenous antibiotics. The antibiotics were administered within an appropriate time frame prior to the initiation of the procedure. ANESTHESIA/SEDATION: Fentanyl 25 mcg IV; Versed 0.5 mg IV Moderate Sedation Time:  15 MINUTES The patient was continuously monitored during the procedure by the interventional radiology nurse under my direct supervision. COMPLICATIONS: None immediate. PROCEDURE: Informed written consent was obtained from the patient's power of attorney after a thorough discussion of the procedural risks, benefits and alternatives. All questions were addressed. Maximal Sterile Barrier Technique was utilized including caps, mask, sterile gowns, sterile gloves, sterile drape, hand hygiene and skin antiseptic. A timeout was performed prior to the initiation of the procedure. Previous imaging reviewed. Patient position prone. Noncontrast localization CT performed. The bilateral small  L3-4 level paraspinous abscesses at the level of the L3-4 destructive spondylo discitis were localized and marked. CT aspiration right paraspinous abscess: Under sterile conditions and local anesthesia, an 18 gauge 10 cm access needle was advanced into the right paraspinous abscess. Needle position confirmed with CT. Syringe aspiration yielded only scant bloody fluid. Sample sent for culture. Needle removed. CT aspiration left paraspinous abscess: Also under sterile conditions and local anesthesia, an 18 gauge 10 cm access needle was advanced from a posterior approach to the left paraspinous abscess. Needle position confirmed with CT. Syringe aspiration yielded scant exudative fluid sample sent for culture. Needle removed. Postprocedure imaging demonstrates no hemorrhage or hematoma. Patient tolerated the aspirations well. 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 Electronically Signed   By: Judie Petit.  Shick M.D.   On: 05/21/2019 12:10   ECHOCARDIOGRAM COMPLETE  Result Date: 05/20/2019    ECHOCARDIOGRAM REPORT   Patient Name:   JAMARIS Kever Date of Exam: 05/20/2019 Medical Rec #:  [id number]   Height:       62.0 in Accession #:    [id number]  Weight:       126.8 lb Date of Birth:  Oct 19, 1960   BSA:          1.575 m Patient Age:    58 years    BP:           153/78 mmHg Patient Gender: F           HR:           101 bpm. Exam Location:  [facility] Procedure: 2D Echo, Color Doppler, Cardiac Doppler and Saline Contrast Bubble            Study Indications:     E831.9  Stroke  History:         Patient has no prior history of Echocardiogram examinations.                  Risk Factors:Diabetes.  Sonographer:     Charmayne Sheer RDCS (AE) Referring Phys:  [id number] AVA Benny Lennert Diagnosing Phys: Bartholome Bill MD IMPRESSIONS  1. Left ventricular ejection fraction, by estimation, is 55 to 60%. The left ventricle has normal function. The left ventricle has no regional wall motion  abnormalities. Left ventricular diastolic parameters are consistent with Grade I diastolic dysfunction (impaired relaxation).  2. Right ventricular systolic function is normal. The right ventricular size is normal.  3. Left atrial size was mildly dilated.  4. The mitral valve is grossly normal. Trivial mitral valve regurgitation. Mild mitral stenosis.  5. The aortic valve was not well visualized. Aortic valve regurgitation is trivial. Mild to moderate aortic valve sclerosis/calcification is present, without any evidence of aortic stenosis.  6. Aortic dilatation noted. There is borderline dilatation of the aortic root measuring 39 mm.  7. Agitated saline contrast bubble study was negative, with no evidence of any interatrial shunt. FINDINGS  Left Ventricle: Left ventricular ejection fraction, by estimation, is 55 to 60%. The left ventricle has normal function. The left ventricle has no regional wall motion abnormalities. The left ventricular internal cavity size was normal in size. There is  no left ventricular hypertrophy. Left ventricular diastolic parameters are consistent with Grade I diastolic dysfunction (impaired relaxation). Right Ventricle: The right ventricular size is normal. No increase in right ventricular wall thickness. Right ventricular systolic function is normal. Left Atrium: Left atrial size was mildly dilated. Right Atrium: Right atrial size was normal in size. Pericardium: There is no evidence of pericardial effusion. Mitral Valve: The mitral valve is grossly normal. Trivial mitral valve regurgitation. Mild mitral valve stenosis. MV peak gradient, 24.2 mmHg. The mean mitral valve gradient is 12.0 mmHg. Tricuspid Valve: The tricuspid valve is not well visualized. Tricuspid valve regurgitation is trivial. Aortic Valve: The aortic valve was not well visualized. Aortic valve regurgitation is trivial. Mild to moderate aortic valve sclerosis/calcification is present, without any evidence of aortic  stenosis. Aortic valve mean gradient measures 5.0 mmHg. Aortic  valve peak gradient measures 8.8 mmHg. Aortic valve area, by VTI measures 4.01 cm. Pulmonic Valve: The pulmonic valve was not well visualized. Pulmonic valve regurgitation is trivial. Aorta: Aortic dilatation noted. There is borderline dilatation of the aortic root measuring 39 mm. IAS/Shunts: The atrial septum is grossly normal. Agitated saline contrast was given intravenously to evaluate for intracardiac shunting. Agitated saline contrast bubble study was negative, with no evidence of any interatrial shunt.  LEFT VENTRICLE PLAX 2D LVIDd:         4.49 cm  Diastology LVIDs:         3.24 cm  LV e' lateral:   4.35 cm/s LV PW:         0.91 cm  LV E/e' lateral: 35.2 LV IVS:        1.08 cm  LV e' medial:    4.35 cm/s LVOT diam:     2.30 cm  LV E/e' medial:  35.2 LV SV:         89 LV SV Index:   57 LVOT Area:     4.15 cm  RIGHT VENTRICLE RV Basal diam:  3.68 cm LEFT ATRIUM             Index  RIGHT ATRIUM           Index LA diam:        3.60 cm 2.29 cm/m  RA Area:     17.60 cm LA Vol (A2C):   87.0 ml 55.24 ml/m RA Volume:   46.70 ml  29.65 ml/m LA Vol (A4C):   68.8 ml 43.69 ml/m LA Biplane Vol: 79.4 ml 50.42 ml/m  AORTIC VALVE                    PULMONIC VALVE AV Area (Vmax):    3.34 cm     PV Vmax:       0.88 m/s AV Area (Vmean):   3.47 cm     PV Vmean:      60.800 cm/s AV Area (VTI):     4.01 cm     PV VTI:        0.143 m AV Vmax:           148.00 cm/s  PV Peak grad:  3.1 mmHg AV Vmean:          105.000 cm/s PV Mean grad:  2.0 mmHg AV VTI:            0.223 m AV Peak Grad:      8.8 mmHg AV Mean Grad:      5.0 mmHg LVOT Vmax:         119.00 cm/s LVOT Vmean:        87.600 cm/s LVOT VTI:          0.215 m LVOT/AV VTI ratio: 0.96  AORTA Ao Root diam: 3.00 cm MITRAL VALVE MV Area (PHT): 7.37 cm     SHUNTS MV Peak grad:  24.2 mmHg    Systemic VTI:  0.22 m MV Mean grad:  12.0 mmHg    Systemic Diam: 2.30 cm MV Vmax:       2.46 m/s MV Vmean:      159.0  cm/s MV Decel Time: 103 msec MV E velocity: 153.00 cm/s MV A velocity: 227.00 cm/s MV E/A ratio:  0.67 Harold Hedge MD Electronically signed by Harold Hedge MD Signature Date/Time: 05/20/2019/1:21:38 PM    Final         Scheduled Meds: . ascorbic acid  500 mg Oral BID  . aspirin EC  325 mg Oral Daily  . collagenase   Topical Daily  . gabapentin  300 mg Oral TID  . insulin aspart  0-15 Units Subcutaneous TID WC  . insulin glargine  15 Units Subcutaneous Daily  . lamoTRIgine  100 mg Oral BID  . levothyroxine  125 mcg Oral Daily  . multivitamin with minerals  1 tablet Oral Daily  . mupirocin ointment  1 application Nasal BID  . pneumococcal 23 valent vaccine  0.5 mL Intramuscular Tomorrow-1000  . Ensure Max Protein  11 oz Oral BID BM  . simvastatin  10 mg Oral q1800  . venlafaxine XR  150 mg Oral Q breakfast   Continuous Infusions:    LOS: 3 days    Time spent: 25 minutes    Alberteen Sam, MD [facility] 05/22/2019, 8:51 AM     Please page though AMION or Epic secure chat:  For [employer], Higher education careers adviser

## 2019-05-23 ENCOUNTER — Inpatient Hospital Stay: Payer: Commercial Managed Care - PPO

## 2019-05-23 ENCOUNTER — Inpatient Hospital Stay: Payer: Self-pay

## 2019-05-23 LAB — GLUCOSE, CAPILLARY
Glucose-Capillary: 180 mg/dL — ABNORMAL HIGH (ref 70–99)
Glucose-Capillary: 228 mg/dL — ABNORMAL HIGH (ref 70–99)
Glucose-Capillary: 164 mg/dL — ABNORMAL HIGH (ref 70–99)
Glucose-Capillary: 110 mg/dL — ABNORMAL HIGH (ref 70–99)

## 2019-05-23 LAB — CBC
HCT: 30.8 % — ABNORMAL LOW (ref 36.0–46.0)
Hemoglobin: 10.6 g/dL — ABNORMAL LOW (ref 12.0–15.0)
MCH: 30 pg (ref 26.0–34.0)
MCHC: 34.4 g/dL (ref 30.0–36.0)
MCV: 87.3 fL (ref 80.0–100.0)
Platelets: 367 10*3/uL (ref 150–400)
RBC: 3.53 MIL/uL — ABNORMAL LOW (ref 3.87–5.11)
RDW: 14.5 % (ref 11.5–15.5)
WBC: 11.7 10*3/uL — ABNORMAL HIGH (ref 4.0–10.5)
nRBC: 0 % (ref 0.0–0.2)

## 2019-05-23 LAB — KAPPA/LAMBDA LIGHT CHAINS
Kappa free light chain: 73.9 mg/L — ABNORMAL HIGH (ref 3.3–19.4)
Kappa, lambda light chain ratio: 1.54 (ref 0.26–1.65)
Lambda free light chains: 47.9 mg/L — ABNORMAL HIGH (ref 5.7–26.3)

## 2019-05-23 LAB — HEPATIC FUNCTION PANEL
ALT: 10 U/L (ref 0–44)
AST: 14 U/L — ABNORMAL LOW (ref 15–41)
Albumin: 2.4 g/dL — ABNORMAL LOW (ref 3.5–5.0)
Alkaline Phosphatase: 138 U/L — ABNORMAL HIGH (ref 38–126)
Bilirubin, Direct: <0.1 mg/dL (ref 0.0–0.2)
Total Bilirubin: 0.6 mg/dL (ref 0.3–1.2)
Total Protein: 6.9 g/dL (ref 6.5–8.1)

## 2019-05-23 LAB — BASIC METABOLIC PANEL
Anion gap: 8 (ref 5–15)
BUN: 10 mg/dL (ref 6–20)
CO2: 31 mmol/L (ref 22–32)
Calcium: 8.7 mg/dL — ABNORMAL LOW (ref 8.9–10.3)
Chloride: 98 mmol/L (ref 98–111)
Creatinine, Ser: 0.38 mg/dL — ABNORMAL LOW (ref 0.44–1.00)
GFR calc Af Amer: >60 mL/min (ref >60)
GFR calc non Af Amer: >60 mL/min (ref >60)
Glucose, Bld: 187 mg/dL — ABNORMAL HIGH (ref 70–99)
Potassium: 3.9 mmol/L (ref 3.5–5.1)
Sodium: 137 mmol/L (ref 135–145)

## 2019-05-23 LAB — PHOSPHORUS: Phosphorus: 3.3 mg/dL (ref 2.5–4.6)

## 2019-05-23 IMAGING — DX DG FOOT 2V*R*
2 series · 2 of 2 positions shown · non-contrast
Comparison: None.

CLINICAL DATA: Right knee and foot pain

EXAM:
RIGHT FOOT - 2 VIEW

[foot ap]
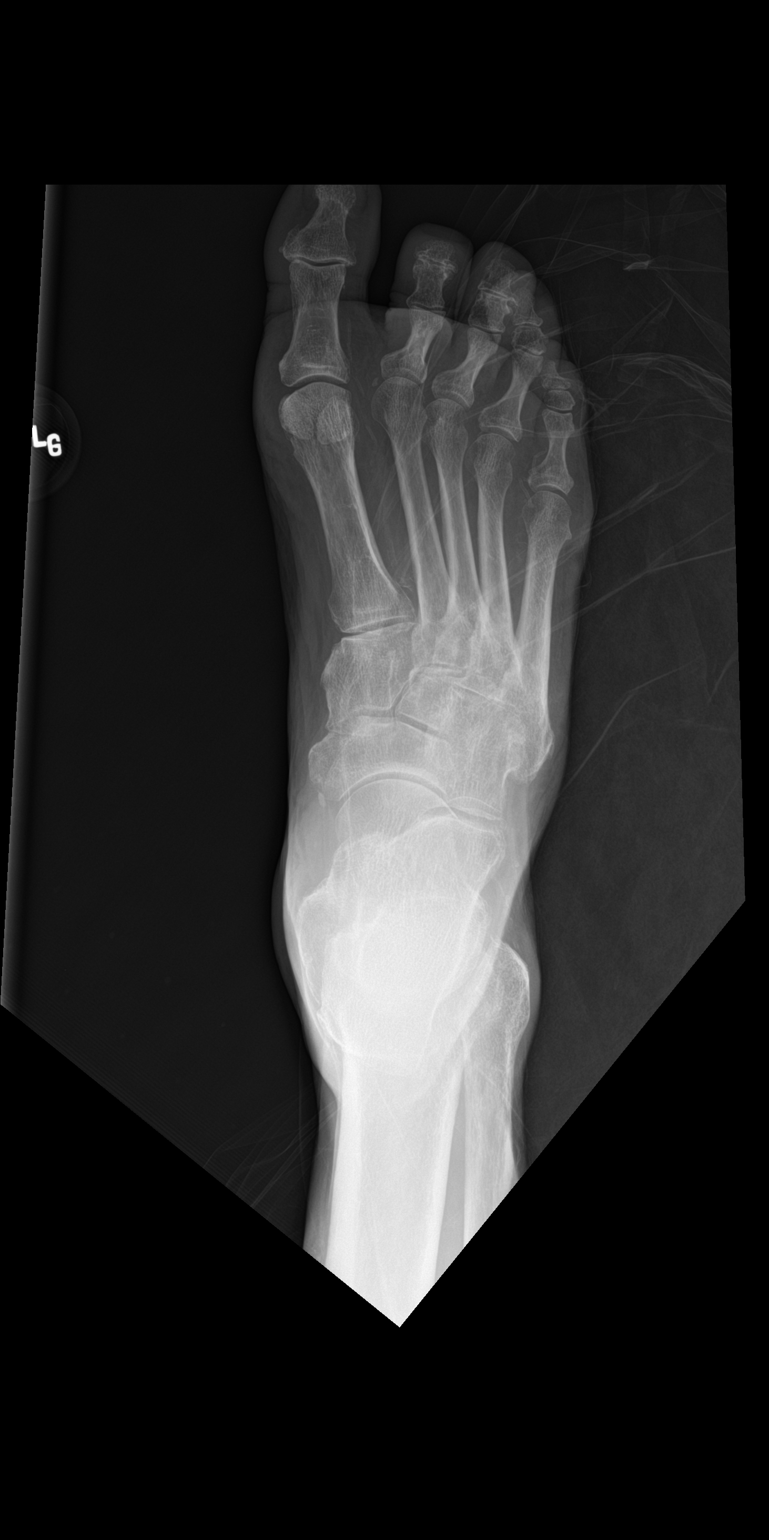

[foot lat]
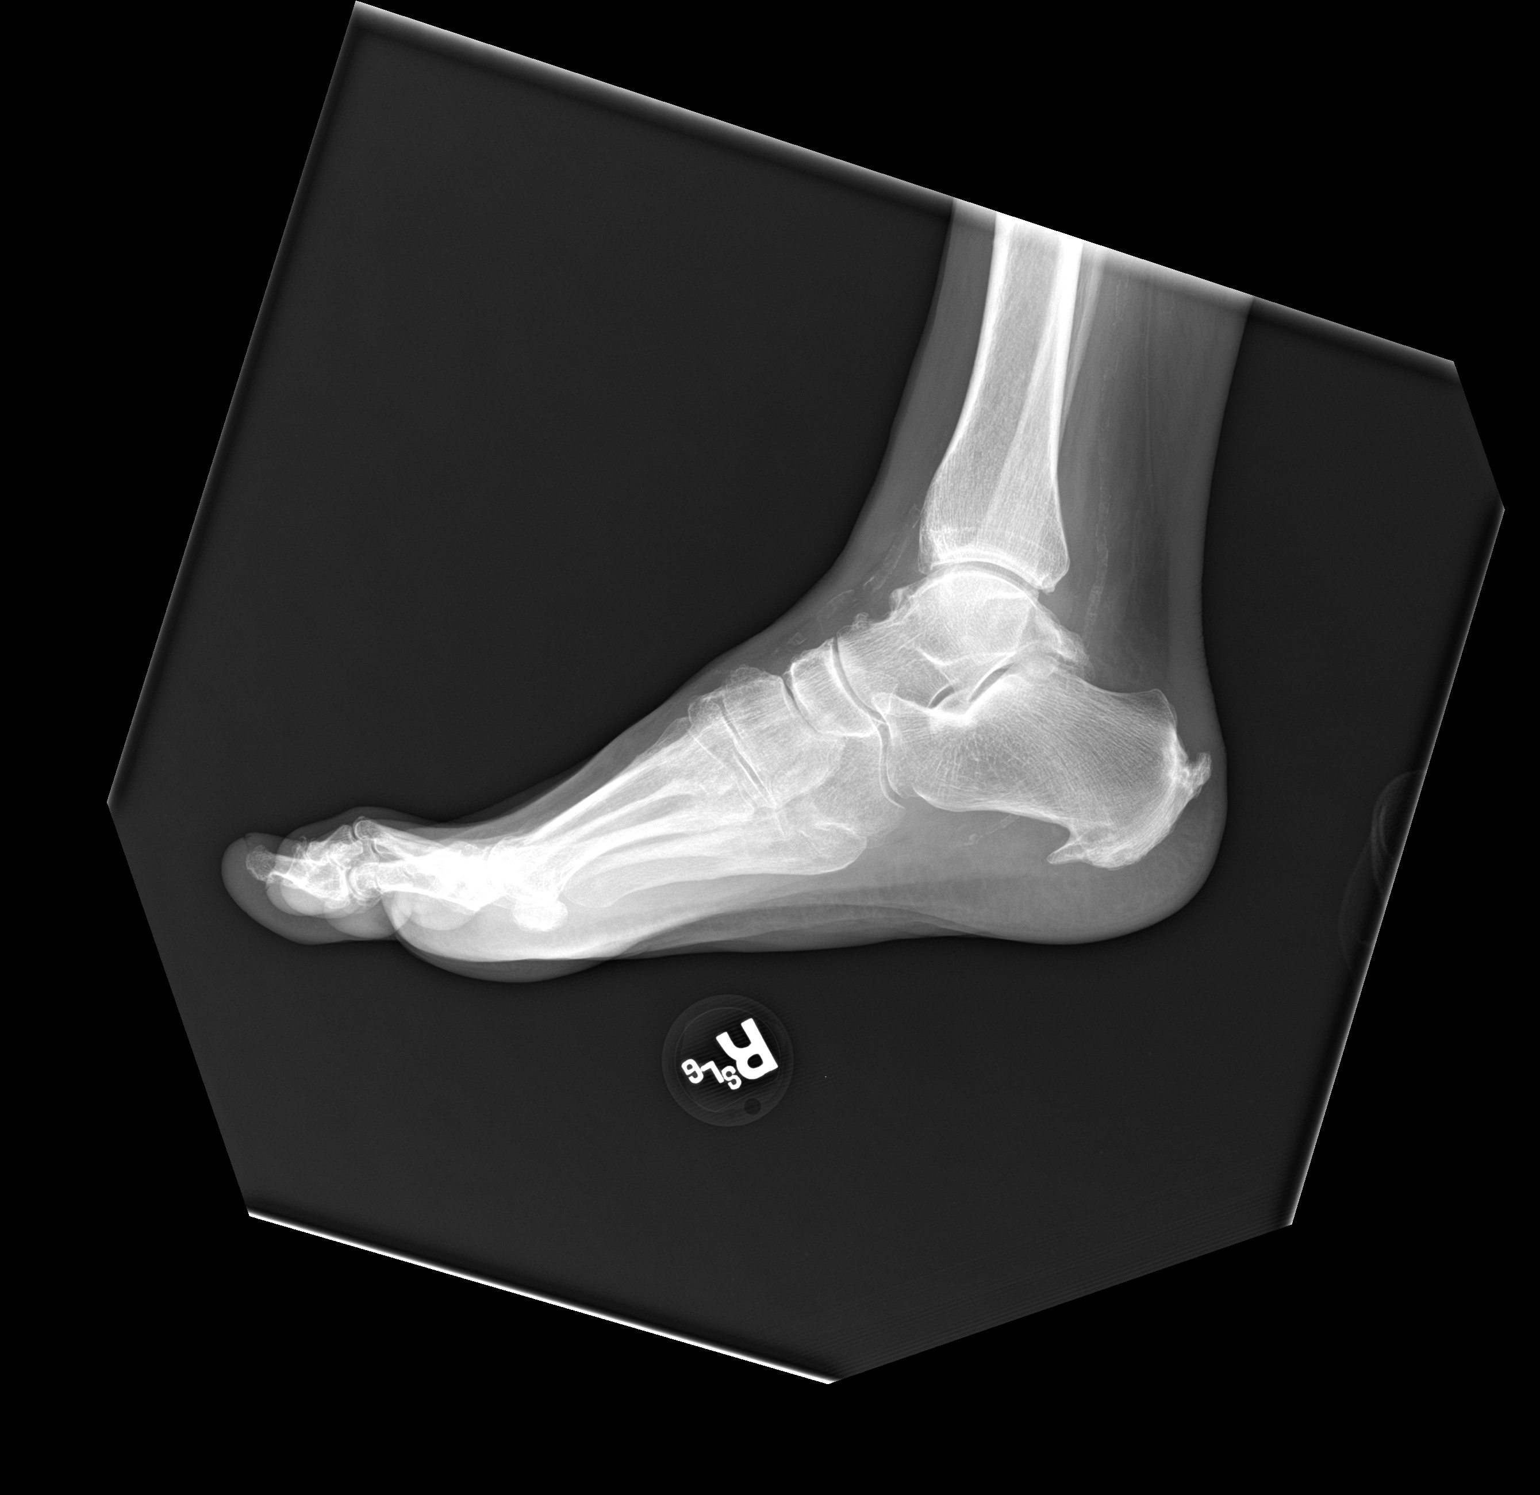

[2 of 2 positions shown; findings below may reference images not displayed]

FINDINGS: There is no evidence of fracture or dislocation. Midfoot
osteoarthritis is seen with joint space loss and dorsal osteophyte
formation. Calcaneal enthesophytes are noted. Scattered vascular
calcifications are seen. There is diffuse osteopenia.
IMPRESSION: No acute osseous abnormality.

## 2019-05-23 IMAGING — DX DG KNEE 1-2V*R*
2 series · 2 of 2 positions shown · non-contrast
Comparison: None.

CLINICAL DATA: Right knee and foot pain

EXAM:
RIGHT KNEE - 1-2 VIEW

[knee ap]
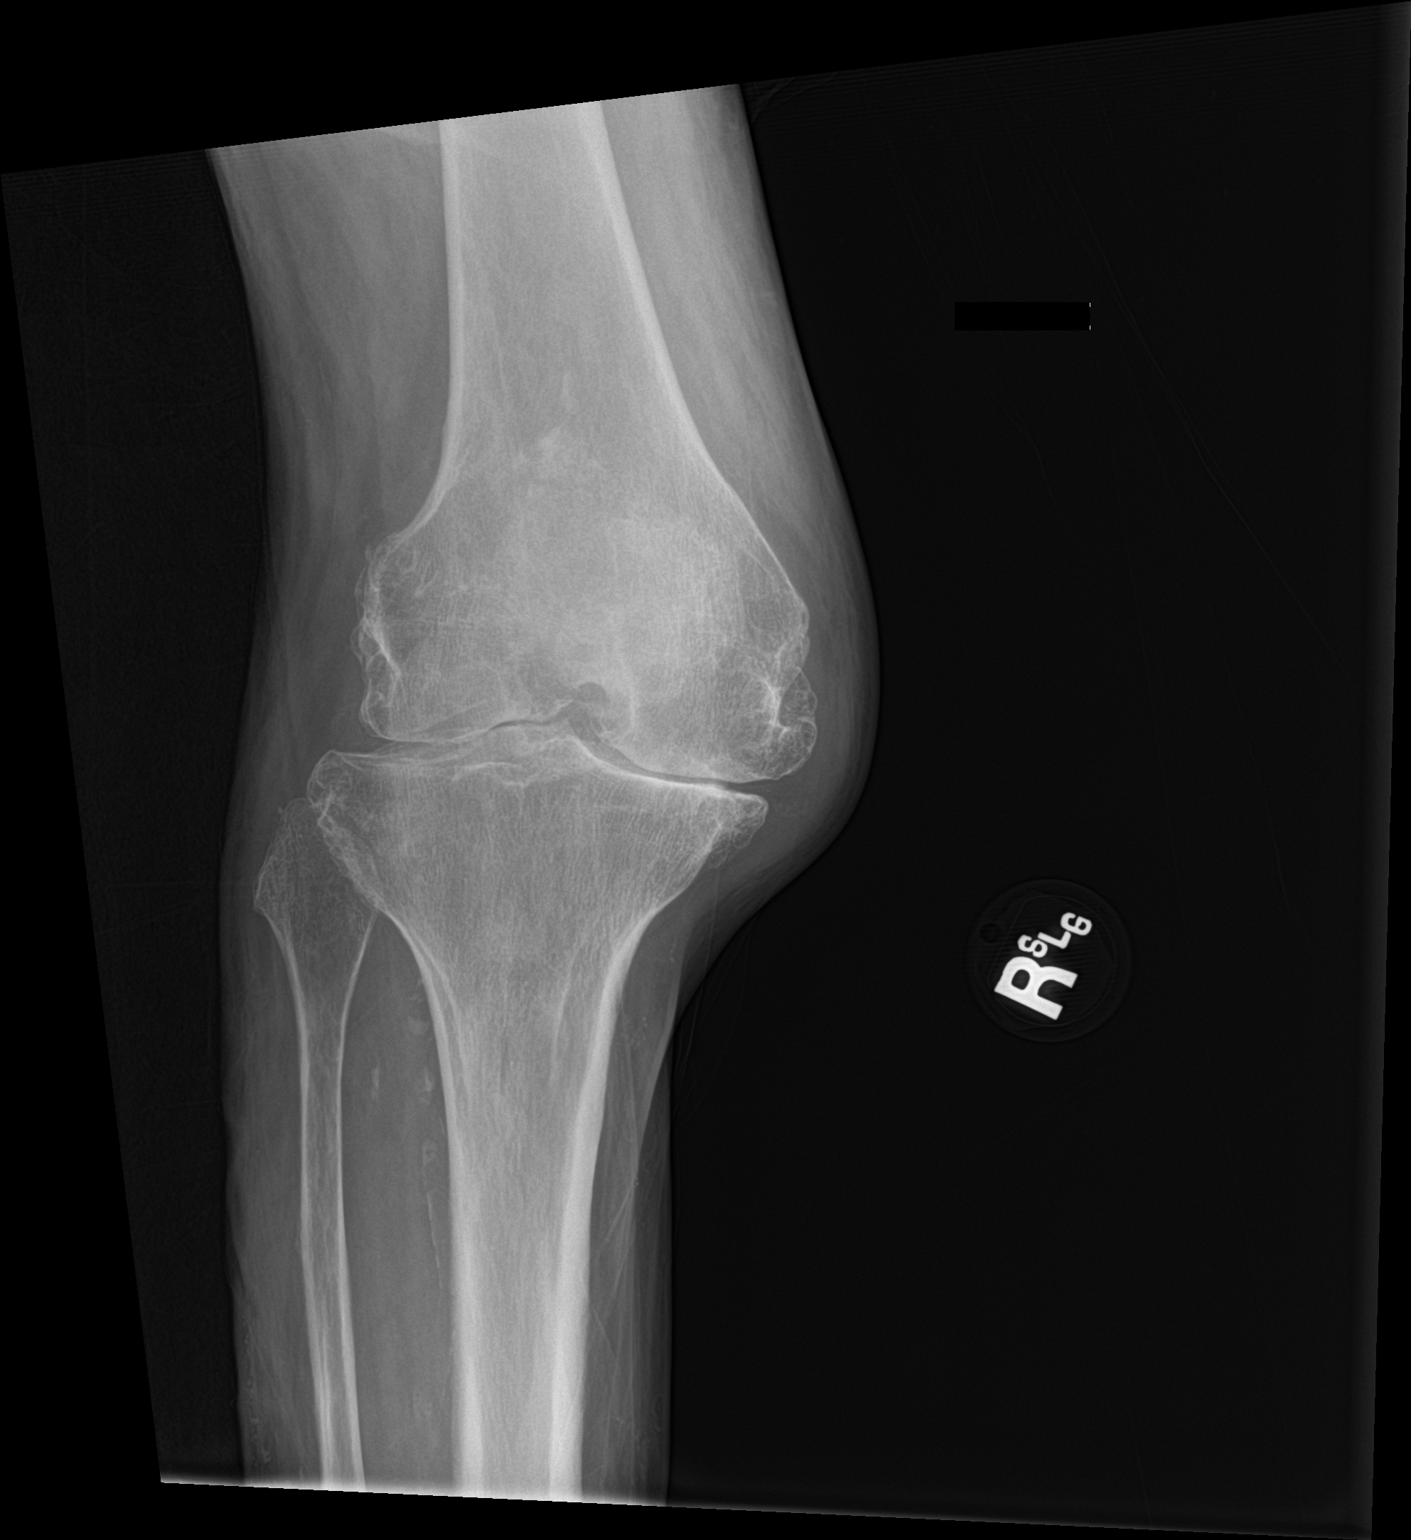

[knee lat]
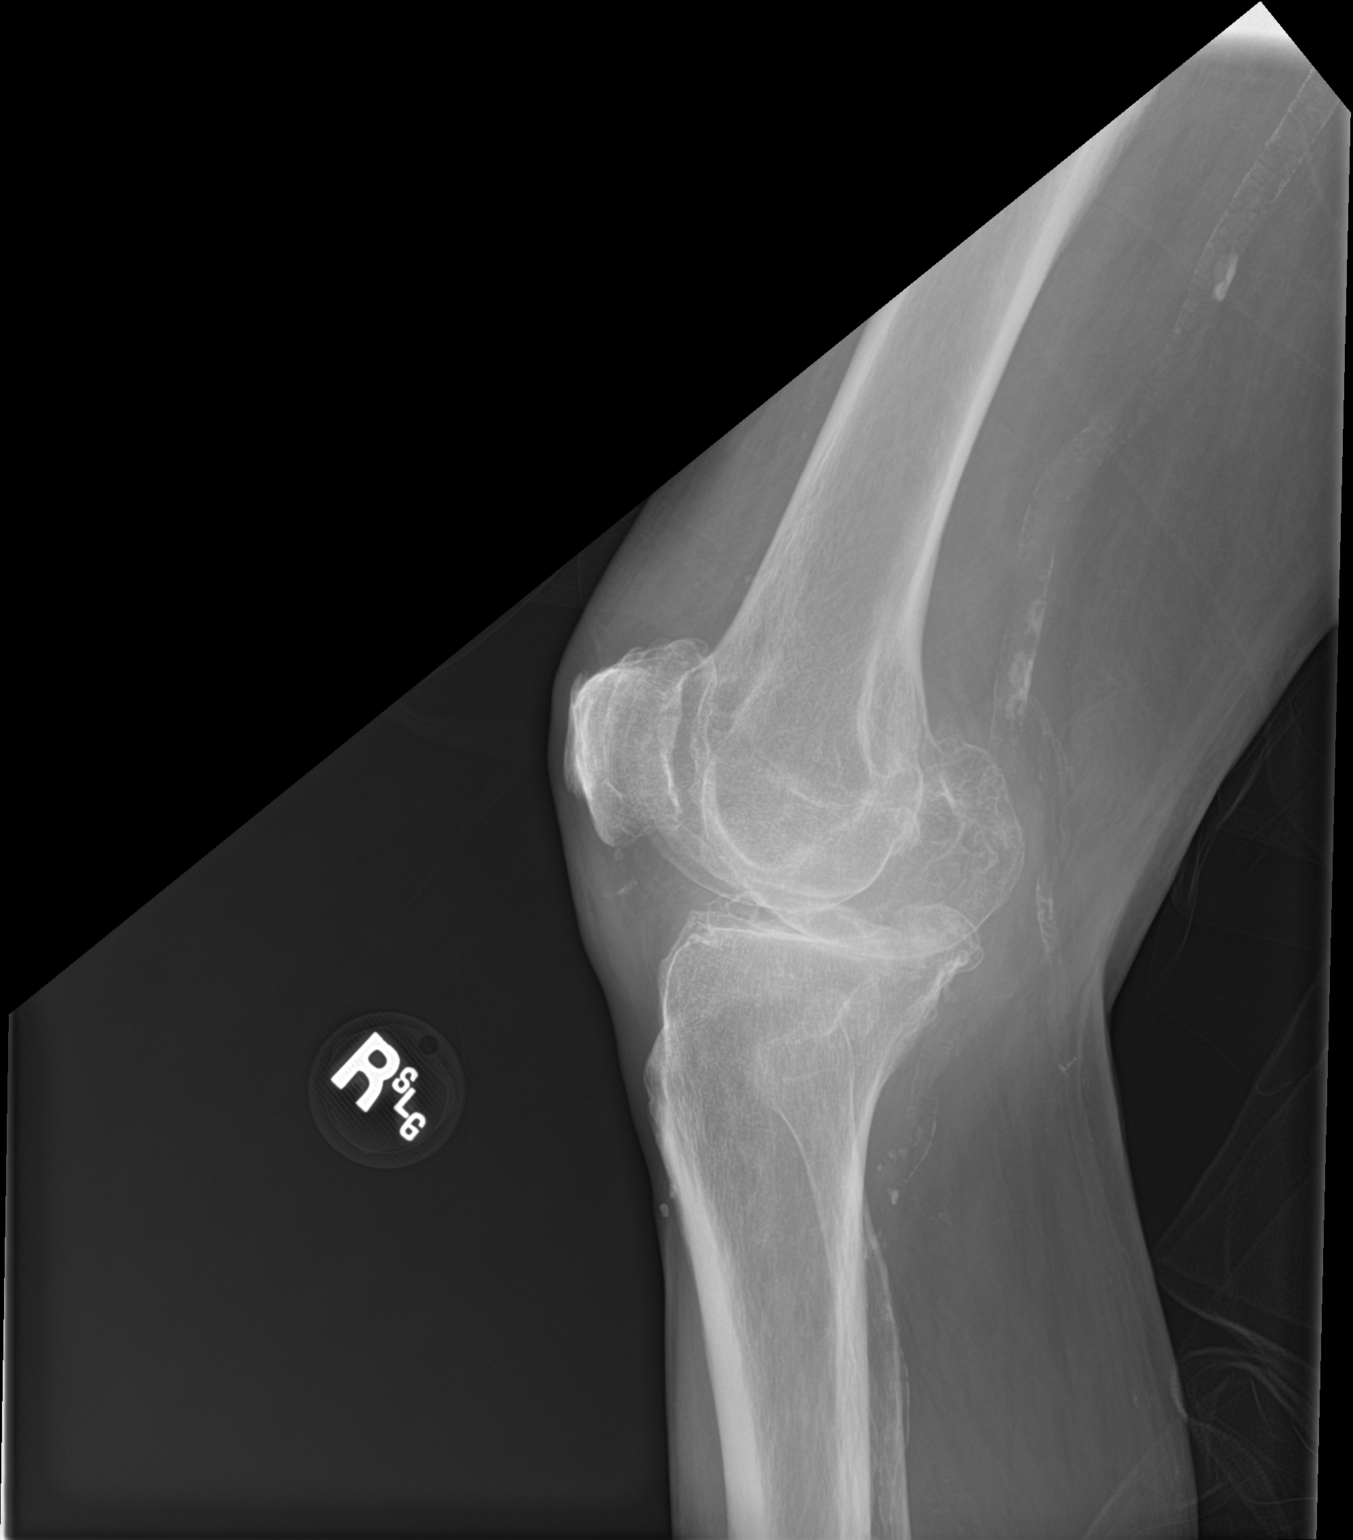

[2 of 2 positions shown; findings below may reference images not displayed]

FINDINGS: No definite fracture or dislocation. There is advanced
tricompartmental osteoarthritis with joint space loss and marginal
osteophyte formation. Significant overlying soft tissue swelling
seen around the medial aspect of the knee. A small knee joint
effusion is seen. Dense vascular calcifications are noted. Diffuse
osteopenia seen.
IMPRESSION: No definite acute osseous abnormality.

Advanced tricompartmental osteoarthritis.

Soft tissue swelling over the medial aspect of the knee.

## 2019-05-23 MED ORDER — VITAMIN D (ERGOCALCIFEROL) 1.25 MG (50000 UNIT) PO CAPS
50000.0000 [IU] | ORAL_CAPSULE | ORAL | Status: DC
  Administered 2019-05-23: 50000 [IU] via ORAL
  Filled 2019-05-23 (×2): qty 1

## 2019-05-23 MED ORDER — SODIUM CHLORIDE 0.9% FLUSH: 10.0000 mL | INTRAVENOUS | Status: DC | PRN

## 2019-05-23 MED ORDER — CHLORHEXIDINE GLUCONATE CLOTH 2 % EX PADS
6.0000 | MEDICATED_PAD | Freq: Every day | CUTANEOUS | Status: DC
  Administered 2019-05-23 – 2019-05-28 (×6): 6 via TOPICAL

## 2019-05-23 MED ORDER — NON FORMULARY: 3.0000 mg | Freq: Every evening | Status: DC | PRN

## 2019-05-23 MED ORDER — SODIUM CHLORIDE 0.9 % IV SOLN
2.0000 g | INTRAVENOUS | Status: DC
  Administered 2019-05-23 – 2019-05-28 (×6): 2 g via INTRAVENOUS
  Filled 2019-05-23 (×4): qty 2
  Filled 2019-05-23: qty 20
  Filled 2019-05-23: qty 2

## 2019-05-23 MED ORDER — SODIUM CHLORIDE 0.9% FLUSH
10.0000 mL | Freq: Two times a day (BID) | INTRAVENOUS | Status: DC
  Administered 2019-05-23 – 2019-05-28 (×10): 10 mL

## 2019-05-23 NOTE — Progress Notes (Signed)
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

## 2019-05-23 NOTE — Evaluation (Signed)
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 She has lost 120 lbs over the past 2 years. She has been having frequent falls. The patient lost so much weight that she actually believed that she no longer needed to take insulin. She was recently admitted to the hospital after she fell and broke her hip. She was found to have a glucose of more than 600.  Before that, however, she had been calling her sister and telling her that she was having difficulty at work learning new techniques and focusing. Since she has been admitted to SNF the patient has continued to have more alarming weight loss despite fair oral intake. She has a sacral pressure ulcer, recent sepsis due to UTI. She has known bipolar disorder, but her sister doesn't know if she takes her meds or not.  In the ED she is found to have severe hypokalemia, hypomagnesemia, leukocytosis, CT of the head has demonstrated an acute/subacute infarct of the mid to anterior left frontal lobe. There is also mild generalized parenchymal atrophy. CT of the abdomen demonstrated an unhealed comminuted fracture of the greater tuberosity of the proximal right femur. 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 There is a small non-obstructive left renal calculus and a moderate sized fat-containing periumbilical hernia. There is a solitary gallstone noted. There is left hepatic pneumobilia due to prior sphincterotomy."  MRI of brain negative for acute/subacute infarction (L frontal abnormality shown by CT is most consistent with small hemorrhagic contusion).  Imaging also showing B psoas abscesses.  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  PMH includes  bipolar 1 disorder, DM, hypokalemia, major depressive disorder, rabdomyolysis, thrombocytopenia, stroke.  Clinical Impression  Prior to hospital admission, pt was recently at rehab and requiring assist with bed mobility and transfers (pt's sister reports a brace was discussed previously (prior to this hospital admission) but pt never received one); prior to recent decline in status pt was independent.  Pt pre-medicated with pain meds for PT session.  Impaired B LE ROM and strength noted. Currently pt is mod to max assist with logrolling in bed (for clean-up and linen change with NT) and 2 assist with bed mobility.  Pt fluctuating between max assist to close SBA for sitting balance d/t posterior lean.  Pt initially reporting 5/10 low back pain at rest beginning of session but did not c/o back pain after that; 5/10 R hip pain during sessions activities.  Initially pt appearing fearful of sitting edge of bed (concerns of falling) requiring extra support (verbal support and assist for sitting balance) and improved comfort noted as pt was sitting edge of bed.  After about 10 minutes sitting edge of bed working on sitting balance (with feet supported on ground), pt reporting pain in L thigh/knee and then suddenly pt reporting significant L knee pain so pt assisted back to bed to attempt to reposition for comfort.  Pt initially appearing more comfortable in bed and was talking about her L thigh/knee pain (that was new since recent fall) and then pt started talking about her R knee pain that she has had for about 3 months; then pt suddenly appeared to be in significant pain in  R knee so therapist attempted to reposition pt for comfort but unable to relieve pain so therapist called pt's nurse who came to assess pt and brought pt an ice pack (pt appearing more comfortable just prior to therapist leaving pt's room; nurse present in room).  Pt would benefit from skilled PT to address noted impairments and functional  limitations (see below for any additional details).  Upon hospital discharge, pt would benefit from STR.    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; B psoas abscesses; per Neurosurgery note pt can be offered a LSO brace to use when upright if having considerable amount of back pain Restrictions Weight Bearing Restrictions: Yes Other Position/Activity Restrictions: Per pt's sister (regarding R femur fx April 2021), ortho recommended non-op R hip fx but pt could put weight through R LE (although pt tends to put minimal weight through R LE and usually pivots on L LE to chair)      Mobility  Bed Mobility Overal bed mobility: Needs Assistance Bed Mobility: Supine to Sit;Sit to Supine;Rolling Rolling: Mod assist;Max assist(logroll to R and L side d/t urinary incontinence and to change sheets with NT)   Supine to sit: Mod assist;Max assist;+2 for physical assistance;HOB elevated Sit to supine: Mod assist;Max assist;+2 for physical assistance;HOB elevated   General bed mobility comments: assist for trunk and B LE's semi-supine to/from sitting edge of bed; use of bed sheet to improve comfort  Transfers                 General transfer comment: deferred d/t impaired sitting balance and B LE pain  Ambulation/Gait                Stairs            Wheelchair Mobility    Modified Rankin (Stroke Patients Only)       Balance Overall balance assessment: Needs assistance Sitting-balance support: Bilateral upper extremity supported;Feet supported Sitting balance-Leahy Scale: Poor Sitting balance - Comments: varied between max  assist to close SBA; pillow support provided to pt's low back for comfort Postural control: Posterior lean                                   Pertinent Vitals/Pain Pain Assessment: Faces Faces Pain Scale: Hurts whole lot Pain Location: R knee Pain Descriptors / Indicators: Aching;Sharp;Tender;Grimacing;Guarding Pain Intervention(s): Limited activity within patient's tolerance;Monitored during session;Premedicated before session;Repositioned;Patient requesting pain meds-RN notified(RN came end of session with ice pack for pt)  BP 94/55 laying in bed beginning of session and 115/64 sitting edge of bed.  HR 103-111 bpm and O2 sats WFL during sessions activities.    Home Living Family/patient expects to be discharged to:: Skilled nursing facility   Available Help at Discharge: Skilled Nursing Facility             Additional Comments: Pt has been at [facility] in [place] since 4/29    Prior Function Level of Independence: Independent         Comments: Pt was independent prior to recent decline in status (recently requiring assist with bed mobility and transfers at [facility])     Hand Dominance  Extremity/Trunk Assessment   Upper Extremity Assessment Upper Extremity Assessment: RUE deficits/detail;LUE deficits/detail RUE Deficits / Details: R shoulder flexion and elbow flexion/extension at least 3/5 AROM in available range; R shoulder flexion AROM to grossly 90 degrees LUE Deficits / Details: L shoulder flexion and elbow flexion/extension at least 3/5 AROM in available range; L shoulder flexion AROM to grossly 90 degrees    Lower Extremity Assessment Lower Extremity Assessment: RLE deficits/detail;LLE deficits/detail RLE Deficits / Details: R knee extension grossly 20-25 degrees short of neutral; R knee flexion to grossly 70-80 degrees PROM; R DF to grossly neutral; R LE positioned in external rotation RLE: Unable to fully assess due to pain RLE  Sensation: history of peripheral neuropathy LLE Deficits / Details: L knee extension grossly 10 degrees short of neutral; L knee flexion to grossly 70-80 degrees PROM; L DF to grossly neutral LLE Sensation: history of peripheral neuropathy    Cervical / Trunk Assessment Cervical / Trunk Assessment: Normal  Communication   Communication: No difficulties  Cognition Arousal/Alertness: Awake/alert Behavior During Therapy: WFL for tasks assessed/performed Overall Cognitive Status: Impaired/Different from baseline                                 General Comments: Oriented to at least person; increased time to respond at times      General Comments   Nursing cleared pt for participation in physical therapy.  Pt agreeable to PT session.  Pt's sister present and supportive during session.    Exercises     Assessment/Plan    PT Assessment Patient needs continued PT services  PT Problem List Decreased strength;Decreased range of motion;Decreased activity tolerance;Decreased balance;Decreased mobility;Decreased cognition;Decreased knowledge of use of DME;Decreased knowledge of precautions;Pain       PT Treatment Interventions DME instruction;Functional mobility training;Therapeutic activities;Gait training;Therapeutic exercise;Balance training;Patient/family education    PT Goals (Current goals can be found in the Care Plan section)  Acute Rehab PT Goals Patient Stated Goal: to improve pain and mobility PT Goal Formulation: With patient/family Time For Goal Achievement: 06/06/19 Potential to Achieve Goals: Fair    Frequency Min 2X/week   Barriers to discharge Decreased caregiver support      Co-evaluation               AM-PAC PT "6 Clicks" Mobility  Outcome Measure Help needed turning from your back to your side while in a flat bed without using bedrails?: A Lot Help needed moving from lying on your back to sitting on the side of a flat bed without using  bedrails?: Total Help needed moving to and from a bed to a chair (including a wheelchair)?: Total Help needed standing up from a chair using your arms (e.g., wheelchair or bedside chair)?: Total Help needed to walk in hospital room?: Total Help needed climbing 3-5 steps with a railing? : Total 6 Click Score: 7    End of Session   Activity Tolerance: Patient limited by pain Patient left: in bed;with call bell/phone within reach;with bed alarm set;with nursing/sitter in room;with family/visitor present Nurse Communication: Mobility status;Precautions(pt's pain status; pt's sister requesting bed height to be kept raised so she could stay by pt easier (pt's sister reported she would not leave pt with bed height elevated); discussed with pt's nurse) PT Visit Diagnosis: Other abnormalities of gait and mobility (R26.89);Muscle weakness (generalized) (M62.81);History of falling (Z91.81);Difficulty in walking, not elsewhere classified (R26.2);Pain Pain - Right/Left: Right(L knee) Pain -  part of body: Hip;Knee    Time: 1856-3149 PT Time Calculation (min) (ACUTE ONLY): 68 min   Charges:   PT Evaluation $PT Eval Low Complexity: 1 Low PT Treatments $Therapeutic Activity: 38-52 mins       Leitha Bleak, PT 05/23/19, 12:51 PM

## 2019-05-23 NOTE — Progress Notes (Signed)
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 To discharging provider:  please sign these orders via discharge navigator,  Select New Orders & click on the button choice - Manage This Unsigned Work.     Thank you for allowing pharmacy to be a part of this patient's care.  Rawan Riendeau A Grethel Zenk 05/23/2019, 5:31 PM

## 2019-05-23 NOTE — Progress Notes (Signed)
Okay to place PICC per Dr. Rivka Safer with infectious disease.

## 2019-05-23 NOTE — Progress Notes (Signed)
PROGRESS NOTE    Alicia Andrade  IOE:[id number] DOB: 1960-08-07 DOA: 05/19/2019 PCP: Bonnita Nasuti, MD      Brief Narrative:  [name] is a 59 y.o. F with bipolar disorder, HTN, DM, hypothyroidism, and recent femur fracture if with fall, confusion, generalized weakness.  The ER, CT imaging of the back showed findings concerning for discitis versus osteomyelitis.  The patient was admitted to the hospital service.      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  Here, imaging of the brain ruled out stroke, but imaging with MR and contrast of the entire spine showed findings consistent with L3-L4 discitis, as well as L3 and L4 osteomyelitis, bilateral psoas abscess.  Other differential included hematoma, cancer. No M-spike on SPEP.  Underwent IR guided disc space aspiration 5/12, both cultures now growing E. Coli.  We suspect this is a primary infection, seeded at the time of her E coli bacteremia several weeks ago. -Start ceftriaxone  -Consult infectious disease, appreciate cares  -Analgesics with oxycodone or morphine as needed -Follow [facility]   Delirium Delirium resolved.  Likely due to medications, recent procedure, underlying infection. -Hold all benzodiapzepines  Bipolar disorder -Continue lamotrigine, venlafaxine  Recent femur fracture Vitamin D deficiency  Cerebral contusion Initially thought to be related to stroke.  Stroke ruled out with MRI. -Supportive care  Hypothyroidism -Continue levothyroxine  Hypertension BP normal -Continue losartan, HCTZ  Diabetes with Peripheral neuropathy Glucoses well controlled -Continue sliding scale corrections -Continue baby aspirin, simvastatin -Continue Lantus -Continue gabapentin  Moderate protein calorie malnutrition -Consult dietitian  Pressure injury, sacrum, unstageable, present on  admission Pressure injury buttocks, left, stage II, present on admission  Anemia of chronic disease Stable, no clinical bleeding  Hypokalemia Resolved  Urinary retention Post void residual done today for 136 cc. -Daily bladder scan -In and out cath as needed -Outpatient urology referral  Knee and ankle pain, RIGHT No redness or swelling to suggest cellulitis.  No effusion of the knee. -Obtain diagnostic x-ray of the knee and ankle on the right      Disposition: Status is: Inpatient  The patient came in with discitis and psoas abscess.  She will require 6 weeks of IV antibiotics.  We started ceftriaxone will plan for PICC line placement today or tomorrow, SNF placement after that for rehabilitation  Dispo: The patient is from: Home              [facility]ticipated [facility]              Anticipated d/c date is: 1 day              Patient currently is not medically stable to d/c.            MDM: The below labs and imaging reports reviewed and summarized above.  Medication management as above.    DVT prophylaxis: SCD Code Status: Full code Family Communication: Sister at the bedside    Consultants:   Infectious disease  IR  Neurosurgery  Procedures:   5/12 CT-guided disc space aspiration  Antimicrobials:      Culture data:   5/11 blood culture -- ng  5/12 disc aspirate -- E coli, pansensitive          Subjective: No further delirium.  No fever.  Back pain is improved.  She has some right knee pain and  right ankle pain.  No redness or swelling.  No fever.  No vomiting.         Objective: Vitals:   05/22/19 2109 05/22/19 2355 05/23/19 0747 05/23/19 1020  BP: (!) 109/55 108/63 (!) 94/59 97/68  Pulse: 98 95 (!) 103 100  Resp: 18 16 17 20   Temp: 97.6 F (36.4 C) (!) 97.4 F (36.3 C) 98 F (36.7 C) 98 F (36.7 C)  TempSrc: Oral Oral Oral Axillary  SpO2: 97% 98%  100%  Weight:      Height:        Intake/Output Summary  (Last 24 hours) at 05/23/2019 1452 Last data filed at 05/23/2019 0246 Gross per 24 hour  Intake 120 ml  Output 400 ml  Net -280 ml   Filed Weights   05/19/19 1138 05/19/19 1718  Weight: 56.7 kg 57.5 kg    Examination: General appearance: Chronically ill-appearing elderly female.  Lying in bed.  No acute distress.  Interactive.     HEENT: Anicteric, conjunctival pink, lids lashes normal.  No nasal deformity, discharge, or epistaxis.  Oropharynx moist, no oral lesions, hearing normal. Skin: No suspicious rashes or lesions.  There is no redness or swelling over the right near the right foot. Cardiac: Regular rate and rhythm, no murmurs, normal JVP, no lower extremity edema Respiratory: Normal respiratory rate and rhythm, lungs clear without rales or wheezes, lung sounds diminished throughout.  Shallow respiratory effort. Abdomen: No tenderness palpation or guarding, no distention or hepatosplenomegaly. MSK: Severe diffuse loss of subcutaneous muscle mass and fat. Neuro: Extraocular movements intact, leg strength severely diminished bilaterally, but symmetric.  Sensation intact to light touch. Psych: Oriented to person, place, and time.  Attention seems normal.  Affect pleasant.     Data Reviewed: I have personally reviewed following labs and imaging studies:  CBC: Recent Labs  Lab 05/19/19 1153 05/20/19 0340 05/21/19 0400 05/22/19 0501 05/23/19 0452  WBC 12.7* 14.1* 11.5* 11.4* 11.7*  NEUTROABS 11.5* 12.0* 9.6*  --   --   HGB 11.0* 9.9* 10.2* 9.9* 10.6*  HCT 32.3* 28.9* 30.9* 29.1* 30.8*  MCV 88.7 86.0 88.8 87.9 87.3  PLT 349 327 382 351 367   Basic Metabolic Panel: Recent Labs  Lab 05/19/19 1153 05/19/19 2045 05/20/19 0340 05/20/19 0837 05/20/19 1733 05/21/19 0400 05/22/19 0501 05/23/19 0452  NA 134*   < > 136  --  137 136 138 137  K 2.6*   < > 2.9*  --  3.6 3.2* 4.1 3.9  CL 91*   < > 95*  --  96* 96* 100 98  CO2 29   < > 31  --  32 30 30 31   GLUCOSE 242*   < >  257*  --  174* 168* 160* 187*  BUN 36*   < > 27*  --  15 12 10 10   CREATININE 0.45   < > 0.43*  --  0.36* <0.30* 0.38* 0.38*  CALCIUM 8.8*   < > 8.4*  --  8.7* 8.5* 8.4* 8.7*  MG 1.7  --   --  1.4*  --  1.9  --   --   PHOS  --   --   --   --   --   --   --  3.3   < > = values in this interval not displayed.   GFR: Estimated Creatinine Clearance: 59.9 mL/min (A) (by C-G formula based on SCr of 0.38 mg/dL (L)). Liver Function Tests: Recent Labs  Lab 05/19/19 1153  AST 15  ALT 10  ALKPHOS 161*  BILITOT 1.1  PROT 8.2*  ALBUMIN 3.0*   No results for input(s): LIPASE, AMYLASE in the last 168 hours. No results for input(s): AMMONIA in the last 168 hours. Coagulation Profile: Recent Labs  Lab 05/19/19 1153  INR 1.1   Cardiac Enzymes: No results for input(s): CKTOTAL, CKMB, CKMBINDEX, TROPONINI in the last 168 hours. BNP (last 3 results) No results for input(s): PROBNP in the last 8760 hours. HbA1C: No results for input(s): HGBA1C in the last 72 hours. CBG: Recent Labs  Lab 05/22/19 1143 05/22/19 1721 05/22/19 2141 05/23/19 0816 05/23/19 1130  GLUCAP 162* 208* 103* 180* 228*   Lipid Profile: No results for input(s): CHOL, HDL, LDLCALC, TRIG, CHOLHDL, LDLDIRECT in the last 72 hours. Thyroid Function Tests: No results for input(s): TSH, T4TOTAL, FREET4, T3FREE, THYROIDAB in the last 72 hours. Anemia Panel: Recent Labs    05/20/19 1734  VITAMINB12 435  FOLATE 8.8   Urine analysis:    Component Value Date/Time   COLORURINE YELLOW (A) 05/20/2019 1500   APPEARANCEUR TURBID (A) 05/20/2019 1500   LABSPEC 1.004 (L) 05/20/2019 1500   PHURINE 5.0 05/20/2019 1500   GLUCOSEU NEGATIVE 05/20/2019 1500   HGBUR MODERATE (A) 05/20/2019 1500   BILIRUBINUR NEGATIVE 05/20/2019 1500   KETONESUR NEGATIVE 05/20/2019 1500   PROTEINUR NEGATIVE 05/20/2019 1500   NITRITE NEGATIVE 05/20/2019 1500   LEUKOCYTESUR LARGE (A) 05/20/2019 1500   Sepsis Labs:  @[email or web address] (procalcitonin:4,lacticacidven:4)  ) Recent Results (from the past 240 hour(s))  SARS Coronavirus 2 by RT PCR (hospital order, performed in [facility] hospital lab) Nasopharyngeal Nasopharyngeal Swab     Status: None   Collection Time: 05/19/19  1:47 PM   Specimen: Nasopharyngeal Swab  Result Value Ref Range Status   SARS Coronavirus 2 NEGATIVE NEGATIVE Final    Comment: (NOTE) SARS-CoV-2 target nucleic acids are NOT DETECTED. The SARS-CoV-2 RNA is generally detectable in upper and lower respiratory specimens during the acute phase of infection. The lowest concentration of SARS-CoV-2 viral copies this assay can detect is 250 copies / mL. A negative result does not preclude SARS-CoV-2 infection and should not be used as the sole basis for treatment or other patient management decisions.  A negative result may occur with improper specimen collection / handling, submission of specimen other than nasopharyngeal swab, presence of viral mutation(s) within the areas targeted by this assay, and inadequate number of viral copies (<250 copies / mL). A negative result must be combined with clinical observations, patient history, and epidemiological information. Fact Sheet for Patients:   BoilerBrush.com.cy Fact Sheet for Healthcare Providers: https://pope.com/ This test is not yet approved or cleared  by the [place] FDA and has been authorized for detection and/or diagnosis of SARS-CoV-2 by FDA under an Emergency Use Authorization (EUA).  This EUA will remain in effect (meaning this test can be used) for the duration of the COVID-19 declaration under Section 564(b)(1) of the Act, 21 U.S.C. section 360bbb-3(b)(1), unless the authorization is terminated or revoked sooner. Performed at [facility], [address] [place], [place] [address]   CULTURE, BLOOD (ROUTINE X 2) w Reflex to ID Panel     Status: None  (Preliminary result)   Collection Time: 05/20/19  9:33 AM   Specimen: BLOOD  Result Value Ref Range Status   Specimen Description BLOOD RIGHT ANTECUBITAL  Final   Special Requests   Final    BOTTLES DRAWN AEROBIC AND ANAEROBIC Blood  Culture adequate volume   Culture   Final    NO GROWTH 3 DAYS Performed at [facility], [address] [place], [place] [address]    Report Status PENDING  Incomplete  CULTURE, BLOOD (ROUTINE X 2) w Reflex to ID Panel     Status: None (Preliminary result)   Collection Time: 05/20/19  9:43 AM   Specimen: BLOOD  Result Value Ref Range Status   Specimen Description BLOOD RIGHT WRIST  Final   Special Requests   Final    BOTTLES DRAWN AEROBIC AND ANAEROBIC Blood Culture adequate volume   Culture   Final    NO GROWTH 3 DAYS Performed at [facility], [address], [place], [place] [address]    Report Status PENDING  Incomplete  MRSA PCR Screening     Status: None   Collection Time: 05/20/19  8:31 PM   Specimen: Nasopharyngeal  Result Value Ref Range Status   MRSA by PCR NEGATIVE NEGATIVE Final    Comment:        The GeneXpert MRSA Assay (FDA approved for NASAL specimens only), is one component of a comprehensive MRSA colonization surveillance program. It is not intended to diagnose MRSA infection nor to guide or monitor treatment for MRSA infections. Performed at [facility], [address] [place], [place] [address]   Aerobic/Anaerobic Culture (surgical/deep wound)     Status: None (Preliminary result)   Collection Time: 05/21/19  9:58 AM   Specimen: Abscess  Result Value Ref Range Status   Specimen Description   Final    ABSCESS Performed at [facility], [address] [place], [place] [address]    Special Requests   Final    NONE Performed at [facility], [address] [place], [place] [address]    Gram Stain   Final    NO WBC SEEN RARE GRAM POSITIVE COCCI Performed at [facility], [address], [place], [place] [address]    Culture   Final    FEW ESCHERICHIA COLI NO ANAEROBES ISOLATED; CULTURE IN PROGRESS FOR 5 DAYS    Report Status PENDING  Incomplete   Organism ID, Bacteria ESCHERICHIA COLI  Final      Susceptibility   Escherichia coli - MIC*    AMPICILLIN <=2 SENSITIVE Sensitive     CEFAZOLIN <=4 SENSITIVE Sensitive     CEFEPIME <=1 SENSITIVE Sensitive     CEFTAZIDIME <=1 SENSITIVE Sensitive     CEFTRIAXONE <=1 SENSITIVE Sensitive     CIPROFLOXACIN <=0.25 SENSITIVE Sensitive     GENTAMICIN <=1 SENSITIVE Sensitive     IMIPENEM <=0.25 SENSITIVE Sensitive     TRIMETH/SULFA <=20 SENSITIVE Sensitive     AMPICILLIN/SULBACTAM <=2 SENSITIVE Sensitive     PIP/TAZO <=4 SENSITIVE Sensitive     * FEW ESCHERICHIA COLI  Aerobic/Anaerobic Culture (surgical/deep wound)     Status: None (Preliminary result)   Collection Time: 05/21/19 10:01 AM   Specimen: Abscess  Result Value Ref Range Status   Specimen Description   Final    ABSCESS Performed at [facility], [address], [place], [place] [address]    Special Requests   Final    NONE Performed at [facility], [address] [place], [place] [address]    Gram Stain   Final    FEW WBC PRESENT,BOTH PMN AND MONONUCLEAR NO ORGANISMS SEEN Performed at [facility], [address], [place], [place] [address]  Culture   Final    RARE ESCHERICHIA COLI NO ANAEROBES ISOLATED; CULTURE IN PROGRESS FOR 5 DAYS    Report Status PENDING  Incomplete   Organism ID, Bacteria ESCHERICHIA COLI  Final      Susceptibility   Escherichia coli - MIC*    AMPICILLIN <=2 SENSITIVE Sensitive     CEFAZOLIN <=4 SENSITIVE Sensitive     CEFEPIME <=1 SENSITIVE Sensitive     CEFTAZIDIME <=1 SENSITIVE Sensitive     CEFTRIAXONE <=1 SENSITIVE Sensitive     CIPROFLOXACIN <=0.25 SENSITIVE Sensitive     GENTAMICIN <=1 SENSITIVE Sensitive     IMIPENEM <=0.25 SENSITIVE Sensitive     TRIMETH/SULFA  <=20 SENSITIVE Sensitive     AMPICILLIN/SULBACTAM <=2 SENSITIVE Sensitive     PIP/TAZO <=4 SENSITIVE Sensitive     * RARE ESCHERICHIA COLI         Radiology Studies: [place] EKG SITE RITE  Result Date: 05/23/2019 If Site Rite image not attached, placement could not be confirmed due to current cardiac rhythm.       Scheduled Meds: . ascorbic acid  500 mg Oral BID  . aspirin EC  325 mg Oral Daily  . collagenase   Topical Daily  . gabapentin  300 mg Oral TID  . insulin aspart  0-15 Units Subcutaneous TID WC  . insulin glargine  15 Units Subcutaneous Daily  . lamoTRIgine  100 mg Oral BID  . levothyroxine  125 mcg Oral Daily  . multivitamin with minerals  1 tablet Oral Daily  . mupirocin ointment  1 application Nasal BID  . Ensure Max Protein  11 oz Oral BID BM  . simvastatin  10 mg Oral q1800  . venlafaxine XR  150 mg Oral Q breakfast   Continuous Infusions: . cefTRIAXone (ROCEPHIN)  IV 2 g (05/23/19 1127)     LOS: 4 days    Time spent: 25 minutes    Alberteen Sam, MD [facility] 05/23/2019, 2:52 PM     Please page though AMION or Epic secure chat:  For [employer], Higher education careers adviser

## 2019-05-23 NOTE — Progress Notes (Signed)
ID Pt feeling okay At night she is still confused and disoriented as per her sister No fever    Patient Vitals for the past 24 hrs:  BP Temp Temp src Pulse Resp SpO2  05/23/19 1020 97/68 98 F (36.7 C) Axillary 100 20 100 %  05/23/19 0747 (!) 94/59 98 F (36.7 C) Oral (!) 103 17 --  05/22/19 2355 108/63 (!) 97.4 F (36.3 C) Oral 95 16 98 %  05/22/19 2109 (!) 109/55 97.6 F (36.4 C) Oral 98 18 97 %  05/22/19 1544 134/77 98.1 F (36.7 C) Oral -- 14 95 %   O/e pale, malnourished Chest b/l air entry HS tachycardia abd soft Pt in bed with frog like position of the lags/hips   Bony swelling of the knee joints    CBC Latest Ref Rng & Units 05/23/2019 05/22/2019 05/21/2019  WBC 4.0 - 10.5 K/uL 11.7(H) 11.4(H) 11.5(H)  Hemoglobin 12.0 - 15.0 g/dL 10.6(L) 9.9(L) 10.2(L)  Hematocrit 36.0 - 46.0 % 30.8(L) 29.1(L) 30.9(L)  Platelets 150 - 400 K/uL 367 351 382     CMP Latest Ref Rng & Units 05/23/2019 05/22/2019 05/21/2019  Glucose 70 - 99 mg/dL 778(E) 423(N) 361(W)  BUN 6 - 20 mg/dL 10 10 12   Creatinine 0.44 - 1.00 mg/dL ) 4.31(V) 4.00(Q)  Sodium 135 - 145 mmol/L 137 138 136  Potassium 3.5 - 5.1 mmol/L 3.9 4.1 3.2(L)  Chloride 98 - 111 mmol/L 98 100 96(L)  CO2 22 - 32 mmol/L 31 30 30   Calcium 8.9 - 10.3 mg/dL <6.76(P) ) 9.5(K)  Total Protein 6.5 - 8.1 g/dL - - -  Total Bilirubin 0.3 - 1.2 mg/dL - - -  Alkaline Phos 38 - 126 U/L - - -  AST 15 - 41 U/L - - -  ALT 0 - 44 U/L - - -      Impression/recommendation  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 She needs colonosocpy to look for any GI lesions especially tumors, polyps as she had also lost lot of weight. Can be done as OP as well.  Recent e.coli bacteremia and UTi when she was admitted in April in [facility]  Diabetes mellitus poorly controlled. In April 2020 hemoglobin was more than  14 but now it is 8.8. ? Weight loss and malnutrition.   Incomplete Bladder - urine retention of 400cc- plan for in and out cath   Fracture due to frequent fall, abnormal gait for many years as per sister ,  Low vitamin D, r/o osteomalacia. Would recommend  Rheumatology opinion regarding management of the same .  Discussed the management with the patient, her sister and Dr.Danford

## 2019-05-23 NOTE — Progress Notes (Signed)
Peripherally Inserted Central Catheter Placement  The IV Nurse has discussed with the patient and/or persons authorized to consent for the patient, the purpose of this procedure and the potential benefits and risks involved with this procedure.  The benefits include less needle sticks, lab draws from the catheter, and the patient may be discharged home with the catheter. Risks include, but not limited to, infection, bleeding, blood clot (thrombus formation), and puncture of an artery; nerve damage and irregular heartbeat and possibility to perform a PICC exchange if needed/ordered by physician.  Alternatives to this procedure were also discussed.  Bard Power PICC patient education guide, fact sheet on infection prevention and patient information card has been provided to patient /or left at bedside. Consent obtained with daughter at bedside    PICC Placement Documentation  PICC Single Lumen 05/23/19 PICC Right Brachial 34 cm 0 cm (Active)  Indication for Insertion or Continuance of Line Home intravenous therapies (PICC only) 05/23/19 1700  Exposed Catheter (cm) 0 cm 05/23/19 1700  Site Assessment Clean;Dry;Intact 05/23/19 1700  Line Status Flushed;Saline locked;Blood return noted 05/23/19 1700  Dressing Type Transparent;Securing device 05/23/19 1700  Dressing Status Clean;Dry;Intact;Antimicrobial disc in place 05/23/19 1700  Dressing Change Due 05/30/19 05/23/19 1700       Timmothy Sours 05/23/2019, 5:50 PM

## 2019-05-23 NOTE — TOC Progression Note (Signed)
Transition of Care [facility]) - Progression Note    Patient Details  Name: Alicia Andrade MRN: [id number] Date of Birth: [birth year]  [facility] of [facility]) CM/SW Contact  Kanon Novosel, Gardiner Rhyme, LCSW Phone Number: 05/23/2019, 2:48 PM  Clinical Narrative:   Met with pt and sister to discuss SNF have heard back from [facility] they could offer her a bed. Sister would really like [facility] have called and not received a return call from them, either has sister who has attempted to call. Continue to work on SNF bed for pt and insurance auth. Sister very involved in pt's care and plans at discharge.    Expected Discharge Plan: [place] Barriers to Discharge: Continued Medical Work up  Expected Discharge Plan and Services Expected Discharge Plan: [place] In-house Referral: Clinical Social Work     Living arrangements for the past 2 months: Single Family Home                                       Social Determinants of Health (SDOH) Interventions    Readmission Risk Interventions No flowsheet data found.

## 2019-05-24 LAB — GLUCOSE, CAPILLARY
Glucose-Capillary: 163 mg/dL — ABNORMAL HIGH (ref 70–99)
Glucose-Capillary: 162 mg/dL — ABNORMAL HIGH (ref 70–99)
Glucose-Capillary: 173 mg/dL — ABNORMAL HIGH (ref 70–99)
Glucose-Capillary: 151 mg/dL — ABNORMAL HIGH (ref 70–99)

## 2019-05-24 LAB — CBC
HCT: 29.2 % — ABNORMAL LOW (ref 36.0–46.0)
Hemoglobin: 9.8 g/dL — ABNORMAL LOW (ref 12.0–15.0)
MCH: 29.7 pg (ref 26.0–34.0)
MCHC: 33.6 g/dL (ref 30.0–36.0)
MCV: 88.5 fL (ref 80.0–100.0)
Platelets: 350 10*3/uL (ref 150–400)
RBC: 3.3 MIL/uL — ABNORMAL LOW (ref 3.87–5.11)
RDW: 14.3 % (ref 11.5–15.5)
WBC: 10.7 10*3/uL — ABNORMAL HIGH (ref 4.0–10.5)
nRBC: 0 % (ref 0.0–0.2)

## 2019-05-24 LAB — BASIC METABOLIC PANEL
Anion gap: 9 (ref 5–15)
BUN: 13 mg/dL (ref 6–20)
CO2: 29 mmol/L (ref 22–32)
Calcium: 8.6 mg/dL — ABNORMAL LOW (ref 8.9–10.3)
Chloride: 98 mmol/L (ref 98–111)
Creatinine, Ser: 0.36 mg/dL — ABNORMAL LOW (ref 0.44–1.00)
GFR calc Af Amer: >60 mL/min (ref >60)
GFR calc non Af Amer: >60 mL/min (ref >60)
Glucose, Bld: 170 mg/dL — ABNORMAL HIGH (ref 70–99)
Potassium: 3.7 mmol/L (ref 3.5–5.1)
Sodium: 136 mmol/L (ref 135–145)

## 2019-05-24 LAB — PARATHYROID HORMONE, INTACT (NO CA): PTH: 30 pg/mL (ref 15–65)

## 2019-05-24 NOTE — Progress Notes (Signed)
PROGRESS NOTE    Alicia Andrade  ZOX:[id number] DOB: 1960/05/21 DOA: 05/19/2019 PCP: Galvin Proffer, MD      Brief Narrative:  Ms. Lotter is a 59 y.o. F with bipolar disorder, HTN, DM, hypothyroidism, and recent femur fracture if with fall, confusion, generalized weakness.  The ER, CT imaging of the back showed findings concerning for discitis versus osteomyelitis.  The patient was admitted to the hospital service.      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  Likely due to medications, recent procedure, underlying infection. -Hold all benzodiapzepines  Bipolar disorder -Continue lamotrigine, venlafaxine  Recent femur fracture Vitamin D deficiency Osteopenia Noted to have a greater tuberosity fracture at OSH.  WBAT, non-operative.    Noted to have osteoporosis (osteopenia by x-ray and fragility fracture), as well as markedly low vitamin D level. Dr. Rivka Safer has raised the question of whether she has osteomalacia, and if so, is this simply from prolonged vitamin D def or from tumor induced osteomalacia.  No prior history of bone disorder, no prior history of vitamin D def, no prior history of gastric bypass.   Received 50,000 units vitamin D yesterday. -Needs Endocrinology referral after discharge to evaluate for osteomalacia  Cerebral contusion Initially thought to be related to stroke.  Stroke ruled out with MRI. -Supportive care  Hypothyroidism -Continue levothyroxine  Hypertension BP normal -Continue losartan, HCTZ  Diabetes with Peripheral neuropathy Glucoses well controlled -Continue sliding scale corrections -Continue baby aspirin, simvastatin -Continue Lantus -Continue gabapentin  Moderate protein calorie malnutrition -Consult dietitian  Pressure injury, sacrum, unstageable, present on admission Pressure injury buttocks, left, stage II, present on admission  Anemia  of chronic disease Stable, no clinical bleeding  Hypokalemia Resolved  Urinary retention -Daily bladder scan -In and out cath as needed -Outpatient urology referral  Knee and ankle pain, RIGHT No redness or swelling to suggest cellulitis.  No effusion of the knee. Xrays show no fracture, just osteopenia      Disposition: Status is: Inpatient  The patient came in with discitis and psoas abscess.  She will require 6 weeks of IV antibiotics.   SNF placement    Dispo: The patient is from: Home              Anticipated d/c is to: SNF              Anticipated d/c date is: 1 day              Patient currently is medically stable to d/c.            MDM: The below labs and imaging reports reviewed and summarized above.  Medication management as above.    DVT prophylaxis: SCD Code Status: Full code Family Communication: Sister at the bedside    Consultants:   Infectious disease  IR  Neurosurgery  Procedures:   5/12 CT-guided disc space aspiration  Antimicrobials:      Culture data:   5/11 blood culture -- ng  5/12 disc aspirate -- E coli, pansensitive          Subjective: No further delirium.  No fever.  Back pain is improved.  She has some right knee pain and right ankle pain.  No redness or swelling.  No fever.  No vomiting.         Objective: Vitals:   05/23/19 1946 05/24/19 0037 05/24/19 0038 05/24/19 0738  BP: 125/76 136/79  128/75  Pulse: (!) 102 (!) 106 (!) 104 (!) 101  Resp: 18 (!) 21 18 16   Temp: 98.2 F (36.8 C) 97.8 F (36.6 C)  97.8 F (36.6 C)  TempSrc: Oral Oral  Oral  SpO2: 99% 97%  97%  Weight:      Height:        Intake/Output Summary (Last 24 hours) at 05/24/2019 1311 Last data filed at 05/24/2019 1049 Gross per 24 hour  Intake 230 ml  Output --  Net 230 ml   Filed Weights   05/19/19 1138 05/19/19 1718  Weight: 56.7 kg 57.5 kg    Examination: General appearance: Chronically ill-appearing elderly  female.  Lying in bed.  No acute distress.  Interactive.     HEENT: Anicteric, conjunctival pink, lids lashes normal.  No nasal deformity, discharge, or epistaxis.  Oropharynx moist, no oral lesions, hearing normal. Skin: No suspicious rashes or lesions.  There is no redness or swelling over the right near the right foot. Cardiac: Regular rate and rhythm, no murmurs, normal JVP, no lower extremity edema Respiratory: Normal respiratory rate and rhythm, lungs clear without rales or wheezes, lung sounds diminished throughout.  Shallow respiratory effort. Abdomen: No tenderness palpation or guarding, no distention or hepatosplenomegaly. MSK: Severe diffuse loss of subcutaneous muscle mass and fat. Neuro: Extraocular movements intact, leg strength severely diminished bilaterally, but symmetric.  Sensation intact to light touch. Psych: Oriented to person, place, and time.  Attention seems normal.  Affect pleasant.     Data Reviewed: I have personally reviewed following labs and imaging studies:  CBC: Recent Labs  Lab 05/19/19 1153 05/19/19 1153 05/20/19 0340 05/21/19 0400 05/22/19 0501 05/23/19 0452 05/24/19 0526  WBC 12.7*   < > 14.1* 11.5* 11.4* 11.7* 10.7*  NEUTROABS 11.5*  --  12.0* 9.6*  --   --   --   HGB 11.0*   < > 9.9* 10.2* 9.9* 10.6* 9.8*  HCT 32.3*   < > 28.9* 30.9* 29.1* 30.8* 29.2*  MCV 88.7   < > 86.0 88.8 87.9 87.3 88.5  PLT 349   < > 327 382 351 367 350   < > = values in this interval not displayed.   Basic Metabolic Panel: Recent Labs  Lab 05/19/19 1153 05/19/19 2045 05/20/19 0340 05/20/19 0837 05/20/19 1733 05/21/19 0400 05/22/19 0501 05/23/19 0452 05/24/19 0526  NA 134*   < >   < >  --  137 136 138 137 136  K 2.6*   < >   < >  --  3.6 3.2* 4.1 3.9 3.7  CL 91*   < >   < >  --  96* 96* 100 98 98  CO2 29   < >   < >  --  32 30 30 31 29   GLUCOSE 242*   < >   < >  --  174* 168* 160* 187* 170*  BUN 36*   < >   < >  --  15 12 10 10 13   CREATININE 0.45   < >    < >  --  0.36* <0.30* 0.38* 0.38* 0.36*  CALCIUM 8.8*   < >   < >  --  8.7* 8.5* 8.4* 8.7* 8.6*  MG 1.7  --   --  1.4*  --  1.9  --   --   --   PHOS  --   --   --   --   --   --   --  3.3  --    < > = values in this interval not displayed.   GFR: Estimated Creatinine Clearance: 59.9 mL/min (A) (by C-G formula based on SCr of 0.36 mg/dL (L)). Liver Function Tests: Recent Labs  Lab 05/19/19 1153 05/23/19 1705  AST 15 14*  ALT 10 10  ALKPHOS 161* 138*  BILITOT 1.1 0.6  PROT 8.2* 6.9  ALBUMIN 3.0* 2.4*   No results for input(s): LIPASE, AMYLASE in the last 168 hours. No results for input(s): AMMONIA in the last 168 hours. Coagulation Profile: Recent Labs  Lab 05/19/19 1153  INR 1.1   Cardiac Enzymes: No results for input(s): CKTOTAL, CKMB, CKMBINDEX, TROPONINI in the last 168 hours. BNP (last 3 results) No results for input(s): PROBNP in the last 8760 hours. HbA1C: No results for input(s): HGBA1C in the last 72 hours. CBG: Recent Labs  Lab 05/23/19 1130 05/23/19 1719 05/23/19 2118 05/24/19 0736 05/24/19 1231  GLUCAP 228* 164* 110* 163* 162*   Lipid Profile: No results for input(s): CHOL, HDL, LDLCALC, TRIG, CHOLHDL, LDLDIRECT in the last 72 hours. Thyroid Function Tests: No results for input(s): TSH, T4TOTAL, FREET4, T3FREE, THYROIDAB in the last 72 hours. Anemia Panel: No results for input(s): VITAMINB12, FOLATE, FERRITIN, TIBC, IRON, RETICCTPCT in the last 72 hours. Urine analysis:    Component Value Date/Time   COLORURINE YELLOW (A) 05/20/2019 1500   APPEARANCEUR TURBID (A) 05/20/2019 1500   LABSPEC 1.004 (L) 05/20/2019 1500   PHURINE 5.0 05/20/2019 1500   GLUCOSEU NEGATIVE 05/20/2019 1500   HGBUR MODERATE (A) 05/20/2019 1500   BILIRUBINUR NEGATIVE 05/20/2019 1500   KETONESUR NEGATIVE 05/20/2019 1500   PROTEINUR NEGATIVE 05/20/2019 1500   NITRITE NEGATIVE 05/20/2019 1500   LEUKOCYTESUR LARGE (A) 05/20/2019 1500   Sepsis  Labs: @LABRCNTIP (procalcitonin:4,lacticacidven:4)  ) Recent Results (from the past 240 hour(s))  SARS Coronavirus 2 by RT PCR (hospital order, performed in [place] hospital lab) Nasopharyngeal Nasopharyngeal Swab     Status: None   Collection Time: 05/19/19  1:47 PM   Specimen: Nasopharyngeal Swab  Result Value Ref Range Status   SARS Coronavirus 2 NEGATIVE NEGATIVE Final    Comment: (NOTE) SARS-CoV-2 target nucleic acids are NOT DETECTED. The SARS-CoV-2 RNA is generally detectable in upper and lower respiratory specimens during the acute phase of infection. The lowest concentration of SARS-CoV-2 viral copies this assay can detect is 250 copies / mL. A negative result does not preclude SARS-CoV-2 infection and should not be used as the sole basis for treatment or other patient management decisions.  A negative result may occur with improper specimen collection / handling, submission of specimen other than nasopharyngeal swab, presence of viral mutation(s) within the areas targeted by this assay, and inadequate number of viral copies (<250 copies / mL). A negative result must be combined with clinical observations, patient history, and epidemiological information. Fact Sheet for Patients:   StrictlyIdeas.no Fact Sheet for Healthcare Providers: BankingDealers.co.za This test is not yet approved or cleared  by the [place] FDA and has been authorized for detection and/or diagnosis of SARS-CoV-2 by FDA under an Emergency Use Authorization (EUA).  This EUA will remain in effect (meaning this test can be used) for the duration of the COVID-19 declaration under Section 564(b)(1) of the Act, 21 U.S.C. section 360bbb-3(b)(1), unless the authorization is terminated or revoked sooner. Performed at [facility], [place], [place] [address]   CULTURE, BLOOD (ROUTINE X 2) w Reflex to ID Panel  Status: None  (Preliminary result)   Collection Time: 05/20/19  9:33 AM   Specimen: BLOOD  Result Value Ref Range Status   Specimen Description BLOOD RIGHT ANTECUBITAL  Final   Special Requests   Final    BOTTLES DRAWN AEROBIC AND ANAEROBIC Blood Culture adequate volume   Culture   Final    NO GROWTH 4 DAYS Performed at [facility], [address] [place], [place] [address]    Report Status PENDING  Incomplete  CULTURE, BLOOD (ROUTINE X 2) w Reflex to ID Panel     Status: None (Preliminary result)   Collection Time: 05/20/19  9:43 AM   Specimen: BLOOD  Result Value Ref Range Status   Specimen Description BLOOD RIGHT WRIST  Final   Special Requests   Final    BOTTLES DRAWN AEROBIC AND ANAEROBIC Blood Culture adequate volume   Culture   Final    NO GROWTH 4 DAYS Performed at [facility], [address] [place], [place] [address]    Report Status PENDING  Incomplete  MRSA PCR Screening     Status: None   Collection Time: 05/20/19  8:31 PM   Specimen: Nasopharyngeal  Result Value Ref Range Status   MRSA by PCR NEGATIVE NEGATIVE Final    Comment:        The GeneXpert MRSA Assay (FDA approved for NASAL specimens only), is one component of a comprehensive MRSA colonization surveillance program. It is not intended to diagnose MRSA infection nor to guide or monitor treatment for MRSA infections. Performed at [facility], [address] [place], [place] [address]   Aerobic/Anaerobic Culture (surgical/deep wound)     Status: None (Preliminary result)   Collection Time: 05/21/19  9:58 AM   Specimen: Abscess  Result Value Ref Range Status   Specimen Description   Final    ABSCESS Performed at [facility], [address] [place], [place] [address]    Special Requests   Final    NONE Performed at [facility], [address] [place], [place] [address]    Gram Stain   Final    NO WBC SEEN RARE GRAM POSITIVE COCCI Performed at [facility], [address], [place], [place] [address]    Culture   Final    FEW ESCHERICHIA COLI NO ANAEROBES ISOLATED; CULTURE IN PROGRESS FOR 5 DAYS    Report Status PENDING  Incomplete   Organism ID, Bacteria ESCHERICHIA COLI  Final      Susceptibility   Escherichia coli - MIC*    AMPICILLIN <=2 SENSITIVE Sensitive     CEFAZOLIN <=4 SENSITIVE Sensitive     CEFEPIME <=1 SENSITIVE Sensitive     CEFTAZIDIME <=1 SENSITIVE Sensitive     CEFTRIAXONE <=1 SENSITIVE Sensitive     CIPROFLOXACIN <=0.25 SENSITIVE Sensitive     GENTAMICIN <=1 SENSITIVE Sensitive     IMIPENEM <=0.25 SENSITIVE Sensitive     TRIMETH/SULFA <=20 SENSITIVE Sensitive     AMPICILLIN/SULBACTAM <=2 SENSITIVE Sensitive     PIP/TAZO <=4 SENSITIVE Sensitive     * FEW ESCHERICHIA COLI  Aerobic/Anaerobic Culture (surgical/deep wound)     Status: None (Preliminary result)   Collection Time: 05/21/19 10:01 AM   Specimen: Abscess  Result Value Ref Range Status   Specimen Description   Final    ABSCESS Performed at [facility], [address], [place], [place] [address]    Special Requests   Final    NONE  Performed at [facility], [address] [place], [place] [address]    Gram Stain   Final    FEW WBC PRESENT,BOTH PMN AND MONONUCLEAR NO ORGANISMS SEEN Performed at [facility], [address], [place], [place] [address]    Culture   Final    RARE ESCHERICHIA COLI NO ANAEROBES ISOLATED; CULTURE IN PROGRESS FOR 5 DAYS    Report Status PENDING  Incomplete   Organism ID, Bacteria ESCHERICHIA COLI  Final      Susceptibility   Escherichia coli - MIC*    AMPICILLIN <=2 SENSITIVE Sensitive     CEFAZOLIN <=4 SENSITIVE Sensitive     CEFEPIME <=1 SENSITIVE Sensitive     CEFTAZIDIME <=1 SENSITIVE Sensitive     CEFTRIAXONE <=1 SENSITIVE Sensitive     CIPROFLOXACIN <=0.25 SENSITIVE Sensitive     GENTAMICIN <=1 SENSITIVE Sensitive     IMIPENEM <=0.25 SENSITIVE Sensitive     TRIMETH/SULFA  <=20 SENSITIVE Sensitive     AMPICILLIN/SULBACTAM <=2 SENSITIVE Sensitive     PIP/TAZO <=4 SENSITIVE Sensitive     * RARE ESCHERICHIA COLI         Radiology Studies: DG Knee 1-2 Views Right  Result Date: 05/23/2019 CLINICAL DATA:  Right knee and foot pain EXAM: RIGHT KNEE - 1-2 VIEW COMPARISON:  None. FINDINGS: No definite fracture or dislocation. There is advanced tricompartmental osteoarthritis with joint space loss and marginal osteophyte formation. Significant overlying soft tissue swelling seen around the medial aspect of the knee. A small knee joint effusion is seen. Dense vascular calcifications are noted. Diffuse osteopenia seen. IMPRESSION: No definite acute osseous abnormality. Advanced tricompartmental osteoarthritis. Soft tissue swelling over the medial aspect of the knee. Electronically Signed   By: Jonna Clark M.D.   On: 05/23/2019 19:49   DG Foot 2 Views Right  Result Date: 05/23/2019 CLINICAL DATA:  Right knee and foot pain EXAM: RIGHT FOOT - 2 VIEW COMPARISON:  None. FINDINGS: There is no evidence of fracture or dislocation. 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 Calcaneal enthesophytes are noted. Scattered vascular calcifications are seen. There is diffuse osteopenia. IMPRESSION: No acute osseous abnormality. Electronically Signed   By: Jonna Clark M.D.   On: 05/23/2019 19:48   [place] EKG SITE RITE  Result Date: 05/23/2019 If Site Rite image not attached, placement could not be confirmed due to current cardiac rhythm.       Scheduled Meds: . ascorbic acid  500 mg Oral BID  . aspirin EC  325 mg Oral Daily  . Chlorhexidine Gluconate Cloth  6 each Topical Daily  . collagenase   Topical Daily  . gabapentin  300 mg Oral TID  . insulin aspart  0-15 Units Subcutaneous TID WC  . insulin glargine  15 Units Subcutaneous Daily  . lamoTRIgine  100 mg Oral BID  . levothyroxine  125 mcg Oral Daily  . multivitamin with minerals  1  tablet Oral Daily  . mupirocin ointment  1 application Nasal BID  . Ensure Max Protein  11 oz Oral BID BM  . simvastatin  10 mg Oral q1800  . sodium chloride flush  10-40 mL Intracatheter Q12H  . venlafaxine XR  150 mg Oral Q breakfast  . Vitamin D (Ergocalciferol)  50,000 Units Oral Q7 days   Continuous Infusions: . cefTRIAXone (ROCEPHIN)  IV Stopped (05/24/19 1049)     LOS: 5 days    Time spent: 25 minutes    Cristal Deer  Lamonte Sakai, MD [facility] 05/24/2019, 1:11 PM     Please page though AMION or Epic secure chat:  For [employer], Higher education careers adviser

## 2019-05-24 NOTE — Progress Notes (Signed)
Subjective: Much more awake and improved as per family at bedside.   Objective: Current vital signs: BP 128/75 (BP Location: Left Arm)   Pulse (!) 101   Temp 97.8 F (36.6 C) (Oral)   Resp 16   Ht 5\' 2"  (1.575 m)   Wt 57.5 kg   SpO2 97%   BMI 23.19 kg/m  Vital signs in last 24 hours: Temp:  [97.8 F (36.6 C)-98.2 F (36.8 C)] 97.8 F (36.6 C) (05/15 0738) Pulse Rate:  [101-106] 101 (05/15 0738) Resp:  [16-21] 16 (05/15 0738) BP: (125-136)/(75-79) 128/75 (05/15 0738) SpO2:  [97 %-99 %] 97 % (05/15 0738)  Intake/Output from previous day: 05/14 0701 - 05/15 0700 In: 120 [P.O.:120] Out: -  Intake/Output this shift: Total I/O In: 110 [I.V.:10; IV Piggyback:100] Out: -  Nutritional status:  Diet Order            Diet heart healthy/carb modified Room service appropriate? Yes; Fluid consistency: Thin  Diet effective now              Neurologic Exam: Mental Status: Alert, oriented to person and place.  Speech fluent without evidence of aphasia.  Able to follow 3 step commands without difficulty. Cranial Nerves: II: Visual fields grossly normal III,IV, VI: ptosis not present, extra-ocular motions intact bilaterally V,VII: smile symmetric, facial light touch sensation normal bilaterally VIII: hearing normal bilaterally IX,X: gag reflex present XI: bilateral shoulder shrug XII: midline tongue extension Motor: Continued decreased ROM in the LUE.  Unable to lift the RLE off the bed  Sensory: Pinprick and light touch decreased in the LLE  Lab Results: Basic Metabolic Panel: Recent Labs  Lab 05/19/19 1153 05/19/19 2045 05/20/19 0340 05/20/19 0837 05/20/19 1733 05/20/19 1733 05/21/19 0400 05/21/19 0400 05/22/19 0501 05/23/19 0452 05/24/19 0526  NA 134*   < >   < >  --  137  --  136  --  138 137 136  K 2.6*   < >   < >  --  3.6  --  3.2*  --  4.1 3.9 3.7  CL 91*   < >   < >  --  96*  --  96*  --  100 98 98  CO2 29   < >   < >  --  32  --  30  --  30 31 29    GLUCOSE 242*   < >   < >  --  174*  --  168*  --  160* 187* 170*  BUN 36*   < >   < >  --  15  --  12  --  10 10 13   CREATININE 0.45   < >   < >  --  0.36*  --  <0.30*  --  0.38* 0.38* 0.36*  CALCIUM 8.8*   < >   < >  --  8.7*   < > 8.5*   < > 8.4* 8.7* 8.6*  MG 1.7  --   --  1.4*  --   --  1.9  --   --   --   --   PHOS  --   --   --   --   --   --   --   --   --  3.3  --    < > = values in this interval not displayed.    Liver Function Tests: Recent Labs  Lab 05/19/19 1153 05/23/19 1705  AST  15 14*  ALT 10 10  ALKPHOS 161* 138*  BILITOT 1.1 0.6  PROT 8.2* 6.9  ALBUMIN 3.0* 2.4*   No results for input(s): LIPASE, AMYLASE in the last 168 hours. No results for input(s): AMMONIA in the last 168 hours.  CBC: Recent Labs  Lab 05/19/19 1153 05/19/19 1153 05/20/19 0340 05/21/19 0400 05/22/19 0501 05/23/19 0452 05/24/19 0526  WBC 12.7*   < > 14.1* 11.5* 11.4* 11.7* 10.7*  NEUTROABS 11.5*  --  12.0* 9.6*  --   --   --   HGB 11.0*   < > 9.9* 10.2* 9.9* 10.6* 9.8*  HCT 32.3*   < > 28.9* 30.9* 29.1* 30.8* 29.2*  MCV 88.7   < > 86.0 88.8 87.9 87.3 88.5  PLT 349   < > 327 382 351 367 350   < > = values in this interval not displayed.    Cardiac Enzymes: No results for input(s): CKTOTAL, CKMB, CKMBINDEX, TROPONINI in the last 168 hours.  Lipid Panel: Recent Labs  Lab 05/20/19 0340  CHOL 174  TRIG 140  HDL 37*  CHOLHDL 4.7  VLDL 28  LDLCALC 678*    CBG: Recent Labs  Lab 05/23/19 0816 05/23/19 1130 05/23/19 1719 05/23/19 2118 05/24/19 0736  GLUCAP 180* 228* 164* 110* 163*    Microbiology: Results for orders placed or performed during the hospital encounter of 05/19/19  SARS Coronavirus 2 by RT PCR (hospital order, performed in [facility] hospital lab) Nasopharyngeal Nasopharyngeal Swab     Status: None   Collection Time: 05/19/19  1:47 PM   Specimen: Nasopharyngeal Swab  Result Value Ref Range Status   SARS Coronavirus 2 NEGATIVE NEGATIVE Final     Comment: (NOTE) SARS-CoV-2 target nucleic acids are NOT DETECTED. The SARS-CoV-2 RNA is generally detectable in upper and lower respiratory specimens during the acute phase of infection. The lowest concentration of SARS-CoV-2 viral copies this assay can detect is 250 copies / mL. A negative result does not preclude SARS-CoV-2 infection and should not be used as the sole basis for treatment or other patient management decisions.  A negative result may occur with improper specimen collection / handling, submission of specimen other than nasopharyngeal swab, presence of viral mutation(s) within the areas targeted by this assay, and inadequate number of viral copies (<250 copies / mL). A negative result must be combined with clinical observations, patient history, and epidemiological information. Fact Sheet for Patients:   BoilerBrush.com.cy Fact Sheet for Healthcare Providers: https://pope.com/ This test is not yet approved or cleared  by the [place] FDA and has been authorized for detection and/or diagnosis of SARS-CoV-2 by FDA under an Emergency Use Authorization (EUA).  This EUA will remain in effect (meaning this test can be used) for the duration of the COVID-19 declaration under Section 564(b)(1) of the Act, 21 U.S.C. section 360bbb-3(b)(1), unless the authorization is terminated or revoked sooner. Performed at [facility], [address] [place], [place] [address]   CULTURE, BLOOD (ROUTINE X 2) w Reflex to ID Panel     Status: None (Preliminary result)   Collection Time: 05/20/19  9:33 AM   Specimen: BLOOD  Result Value Ref Range Status   Specimen Description BLOOD RIGHT ANTECUBITAL  Final   Special Requests   Final    BOTTLES DRAWN AEROBIC AND ANAEROBIC Blood Culture adequate volume   Culture   Final    NO GROWTH 4 DAYS Performed at [facility], [address], [place], [place]  [address]    Report  Status PENDING  Incomplete  CULTURE, BLOOD (ROUTINE X 2) w Reflex to ID Panel     Status: None (Preliminary result)   Collection Time: 05/20/19  9:43 AM   Specimen: BLOOD  Result Value Ref Range Status   Specimen Description BLOOD RIGHT WRIST  Final   Special Requests   Final    BOTTLES DRAWN AEROBIC AND ANAEROBIC Blood Culture adequate volume   Culture   Final    NO GROWTH 4 DAYS Performed at [facility], [address], [place], [place] [address]    Report Status PENDING  Incomplete  MRSA PCR Screening     Status: None   Collection Time: 05/20/19  8:31 PM   Specimen: Nasopharyngeal  Result Value Ref Range Status   MRSA by PCR NEGATIVE NEGATIVE Final    Comment:        The GeneXpert MRSA Assay (FDA approved for NASAL specimens only), is one component of a comprehensive MRSA colonization surveillance program. It is not intended to diagnose MRSA infection nor to guide or monitor treatment for MRSA infections. Performed at [facility], [address] [place], [place] [address]   Aerobic/Anaerobic Culture (surgical/deep wound)     Status: None (Preliminary result)   Collection Time: 05/21/19  9:58 AM   Specimen: Abscess  Result Value Ref Range Status   Specimen Description   Final    ABSCESS Performed at [facility], [address], [place], [place] [address]    Special Requests   Final    NONE Performed at [facility], [address] [place], [place] [address]    Gram Stain   Final    NO WBC SEEN RARE GRAM POSITIVE COCCI Performed at [facility], [place] [address], [place], [place] [address]    Culture   Final    FEW ESCHERICHIA COLI NO ANAEROBES ISOLATED; CULTURE IN PROGRESS FOR 5 DAYS    Report Status PENDING  Incomplete   Organism ID, Bacteria ESCHERICHIA COLI  Final      Susceptibility   Escherichia coli - MIC*    AMPICILLIN <=2 SENSITIVE Sensitive     CEFAZOLIN <=4 SENSITIVE Sensitive     CEFEPIME <=1  SENSITIVE Sensitive     CEFTAZIDIME <=1 SENSITIVE Sensitive     CEFTRIAXONE <=1 SENSITIVE Sensitive     CIPROFLOXACIN <=0.25 SENSITIVE Sensitive     GENTAMICIN <=1 SENSITIVE Sensitive     IMIPENEM <=0.25 SENSITIVE Sensitive     TRIMETH/SULFA <=20 SENSITIVE Sensitive     AMPICILLIN/SULBACTAM <=2 SENSITIVE Sensitive     PIP/TAZO <=4 SENSITIVE Sensitive     * FEW ESCHERICHIA COLI  Aerobic/Anaerobic Culture (surgical/deep wound)     Status: None (Preliminary result)   Collection Time: 05/21/19 10:01 AM   Specimen: Abscess  Result Value Ref Range Status   Specimen Description   Final    ABSCESS Performed at [facility], [address] [place], [place] [address]    Special Requests   Final    NONE Performed at [facility], [address] [place], [place] [address]    Gram Stain   Final    FEW WBC PRESENT,BOTH PMN AND MONONUCLEAR NO ORGANISMS SEEN Performed at [facility], [place] [address] [place], [place] [address]    Culture   Final    RARE ESCHERICHIA COLI NO ANAEROBES ISOLATED; CULTURE IN PROGRESS FOR 5 DAYS    Report Status PENDING  Incomplete   Organism ID, Bacteria ESCHERICHIA COLI  Final      Susceptibility   Escherichia coli - MIC*    AMPICILLIN <=2 SENSITIVE Sensitive     CEFAZOLIN <=4 SENSITIVE Sensitive     CEFEPIME <=1 SENSITIVE Sensitive     CEFTAZIDIME <=1 SENSITIVE Sensitive     CEFTRIAXONE <=1 SENSITIVE Sensitive     CIPROFLOXACIN <=0.25 SENSITIVE Sensitive     GENTAMICIN <=1 SENSITIVE Sensitive     IMIPENEM <=0.25 SENSITIVE Sensitive     TRIMETH/SULFA <=20 SENSITIVE Sensitive     AMPICILLIN/SULBACTAM <=2 SENSITIVE Sensitive     PIP/TAZO <=4 SENSITIVE Sensitive     * RARE ESCHERICHIA COLI    Coagulation Studies: No results for input(s): LABPROT, INR in the last 72 hours.  Imaging: DG Knee 1-2 Views Right  Result Date: 05/23/2019 CLINICAL DATA:  Right knee and foot pain EXAM: RIGHT KNEE - 1-2 VIEW COMPARISON:  None.  FINDINGS: No definite fracture or dislocation. There is advanced tricompartmental osteoarthritis with joint space loss and marginal osteophyte formation. Significant overlying soft tissue swelling seen around the medial aspect of the knee. A small knee joint effusion is seen. Dense vascular calcifications are noted. Diffuse osteopenia seen. IMPRESSION: No definite acute osseous abnormality. Advanced tricompartmental osteoarthritis. Soft tissue swelling over the medial aspect of the knee. Electronically Signed   By: Jonna Clark M.D.   On: 05/23/2019 19:49   DG Foot 2 Views Right  Result Date: 05/23/2019 CLINICAL DATA:  Right knee and foot pain EXAM: RIGHT FOOT - 2 VIEW COMPARISON:  None. FINDINGS: There is no evidence of fracture or dislocation. 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 Calcaneal enthesophytes are noted. Scattered vascular calcifications are seen. There is diffuse osteopenia. IMPRESSION: No acute osseous abnormality. Electronically Signed   By: Jonna Clark M.D.   On: 05/23/2019 19:48   [place] EKG SITE RITE  Result Date: 05/23/2019 If Site Rite image not attached, placement could not be confirmed due to current cardiac rhythm.   Medications:  I have reviewed the patient's current medications. Scheduled: . ascorbic acid  500 mg Oral BID  . aspirin EC  325 mg Oral Daily  . Chlorhexidine Gluconate Cloth  6 each Topical Daily  . collagenase   Topical Daily  . gabapentin  300 mg Oral TID  . insulin aspart  0-15 Units Subcutaneous TID WC  . insulin glargine  15 Units Subcutaneous Daily  . lamoTRIgine  100 mg Oral BID  . levothyroxine  125 mcg Oral Daily  . multivitamin with minerals  1 tablet Oral Daily  . mupirocin ointment  1 application Nasal BID  . Ensure Max Protein  11 oz Oral BID BM  . simvastatin  10 mg Oral q1800  . sodium chloride flush  10-40 mL Intracatheter Q12H  . venlafaxine XR  150 mg Oral Q breakfast  . Vitamin D  (Ergocalciferol)  50,000 Units Oral Q7 days    Assessment/Plan: 59 y.o. female who has a history of stroke (affecting speech), BPD, DM, hypothyroidism and thrombocytopenia who presents with altered mental status.  History is complicated by the fact that it blends with a fall and hip fracture that occurred recently as well prior to the development of the mental status changes.  Findings on neurological examination reveal left and right sided abnormalities. MRI of the cervical, thoracic and lumbar spines have been performed and personally reviewed.  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  MRI of the brain and no acute infarcts are noted.  Possible hemorrhagic contusion seen.  MRA of the neck is unremarkable.   AMS likely multifactorial and related to contusion, hospital delirium and infection.    -Much improved as per family  - this is likely delirium as expressed to family prior - continuation of stimulatin during the day with bright lights, tv on. Thankfully she is in the right room for that.   - call if any questions

## 2019-05-24 NOTE — Evaluation (Signed)
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 She has lost 120 lbs over the past 2 years. She has been having frequent falls. The patient lost so much weight that she actually believed that she no longer needed to take insulin. She was recently admitted to the hospital after she fell and broke her hip. She was found to have a glucose of more than 600.  Before that, however, she had been calling her sister and telling her that she was having difficulty at work learning new techniques and focusing. Since she has been admitted to SNF the patient has continued to have more alarming weight loss despite fair oral intake. She has a sacral pressure ulcer, recent sepsis due to UTI. She has known bipolar disorder, but her sister doesn't know if she takes her meds or not.  In the ED she is found to have severe hypokalemia, hypomagnesemia, leukocytosis, CT of the head has demonstrated an acute/subacute infarct of the mid to anterior left frontal lobe. There is also mild generalized parenchymal atrophy. CT of the abdomen demonstrated an unhealed comminuted fracture of the greater tuberosity of the proximal right femur. There is also lytic destruction involving the vertebral endplates of Y0-7 disc space concerning for discitis and osteomyellitis. There is a small non-obstructive left renal calculus and a moderate sized fat-containing periumbilical hernia. There is a solitary gallstone noted. There is left hepatic pneumobilia due to prior sphincterotomy."  MRI of brain negative for acute/subacute infarction (L frontal abnormality shown by CT is most consistent with small hemorrhagic contusion).  Imaging also showing B psoas abscesses.  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  PMH  includes bipolar 1 disorder, DM, hypokalemia, major depressive disorder, rabdomyolysis, thrombocytopenia, stroke.   Clinical Impression   Pt. presents with  Right knee pain, weakness, limited activity tolerance, and limited functional mobility which limits the ability to complete basic ADL and IADL functioning. Pt. Resided at home alone. Pt. was independent with ADLs, and IADL functioning: including meal preparation, and medication management. Pt. was able to drive and was working as an Restaurant manager, fast food. Pt. was [facility] to [facility] from [facility]. Pt. Initially reports 2-3/10 pain in the right knee. Pt. Was assisted with positioning the RLE onto a pillow per pt. Request which helped initially. Pt. Knee Pain increased to 10+/10 when assessing UE functioning. Pt. Perseverated  On, and was distracted by right knee pain today. Pt. Was assisted with repositioning, and the pillow was removed per pt. request. Nursing was notified of pt.'s right knee pain reports. Pt. Could benefit from OT services for ADL training,  UE there. Ex, A/E training, and pt. Education about home modification, and DME. Pt. would benefit from SNF level of care upon discharge, with follow-up OT services to assist with improving strength, and maximizing function with ADLs, and IADLs.    Follow Up Recommendations  SNF    Equipment Recommendations    TBA   Recommendations for Other Services       Precautions / Restrictions Precautions Precautions: Back;Fall Precaution Booklet Issued: No Precaution Comments: infectious discitis and osteomyelitis L3-4; B psoas abscesses; per Neurosurgery note pt can be offered a LSO brace to use when upright if having considerable amount of back pain Restrictions  Weight Bearing Restrictions: Yes Other Position/Activity Restrictions: Per pt's sister (regarding R femur fx April 2021), ortho recommended non-op R hip fx but pt could put weight through R LE (although pt tends to put minimal  weight through R LE and usually pivots on L LE to chair)      Mobility Bed Mobility               General bed mobility comments: Pt. seen for bedside eval secondary to RLE pain  Transfers                 General transfer comment: Deferred secondary to RLE pain.    Balance                                           ADL either performed or assessed with clinical judgement   ADL Overall ADL's : Needs assistance/impaired Eating/Feeding: Moderate assistance   Grooming: Moderate assistance   Upper Body Bathing: Total assistance   Lower Body Bathing: Total assistance   Upper Body Dressing : Total assistance   Lower Body Dressing: Total assistance   Toilet Transfer: Total assistance   Toileting- Clothing Manipulation and Hygiene: Total assistance               Vision Patient Visual Report: No change from baseline       Perception     Praxis      Pertinent Vitals/Pain Pain Assessment: 0-10 Pain Score: (3/10 to 10+/10) Pain Location: R knee Pain Descriptors / Indicators: Aching;Sharp;Tender;Grimacing;Guarding     Hand Dominance Right   Extremity/Trunk Assessment Upper Extremity Assessment Upper Extremity Assessment: Generalized weakness(Increased time required for motor planning through movements, following each movement with her eyes.)           Communication Communication Communication: No difficulties   Cognition Arousal/Alertness: Awake/alert Behavior During Therapy: WFL for tasks assessed/performed Overall Cognitive Status: Impaired/Different from baseline Area of Impairment: Orientation;Following commands                               General Comments: Increased time required for processing   General Comments       Exercises     Shoulder Instructions      Home Living Family/patient expects to be discharged to:: Skilled nursing facility   Available Help at Discharge: Skilled Nursing  Facility Type of Home: House                           Additional Comments: Pt has been at [facility] in [place] since 4/29  Lives With: Alone    Prior Functioning/Environment Level of Independence: Independent        Comments: Pt. was previouslyindependent with ADLs IADLs, working as an ultrasound technicia        OT Problem List: Decreased strength;Impaired UE functional use;Decreased knowledge of use of DME or AE;Pain;Decreased activity tolerance;Decreased cognition      OT Treatment/Interventions: Self-care/ADL training;Therapeutic exercise;Neuromuscular education;Patient/family education;DME and/or AE instruction;Therapeutic activities    OT Goals(Current goals can be found in the care plan section) Acute Rehab OT Goals Patient Stated Goal: TO improve pain OT Goal Formulation: With patient Time For Goal Achievement: 06/08/19 Potential to Achieve Goals: Fair  OT Frequency: Min 2X/week   Barriers to D/C:  Co-evaluation              AM-PAC OT "6 Clicks" Daily Activity     Outcome Measure Help from another person eating meals?: A Lot Help from another person taking care of personal grooming?: A Lot Help from another person toileting, which includes using toliet, bedpan, or urinal?: Total Help from another person bathing (including washing, rinsing, drying)?: Total Help from another person to put on and taking off regular upper body clothing?: Total Help from another person to put on and taking off regular lower body clothing?: Total 6 Click Score: 8   End of Session    Activity Tolerance: Patient limited by pain Patient left: in bed;with call bell/phone within reach;with nursing/sitter in room  OT Visit Diagnosis: Muscle weakness (generalized) (M62.81)                Time: 3005-1102 OT Time Calculation (min): 38 min Charges:  OT General Charges $OT Visit: 1 Visit OT Evaluation $OT Eval Moderate Complexity: 1 Mod  Olegario Messier, MS, OTR/L Olegario Messier 05/24/2019, 12:08 PM

## 2019-05-24 NOTE — Plan of Care (Signed)
  Problem: Education: Goal: Knowledge of disease or condition will improve Outcome: Progressing Goal: Knowledge of secondary prevention will improve Outcome: Progressing Goal: Knowledge of patient specific risk factors addressed and post discharge goals established will improve Outcome: Progressing Goal: Individualized Educational Video(s) Outcome: Progressing   Problem: Education: Goal: Knowledge of General Education information will improve Description: Including pain rating scale, medication(s)/side effects and non-pharmacologic comfort measures Outcome: Progressing   Problem: Health Behavior/Discharge Planning: Goal: Ability to manage health-related needs will improve Outcome: Progressing   Problem: Clinical Measurements: Goal: Ability to maintain clinical measurements within normal limits will improve Outcome: Progressing Goal: Will remain free from infection Outcome: Progressing Goal: Diagnostic test results will improve Outcome: Progressing Goal: Respiratory complications will improve Outcome: Progressing Goal: Cardiovascular complication will be avoided Outcome: Progressing   Problem: Activity: Goal: Risk for activity intolerance will decrease Outcome: Progressing   Problem: Nutrition: Goal: Adequate nutrition will be maintained Outcome: Progressing   Problem: Coping: Goal: Level of anxiety will decrease Outcome: Progressing   Problem: Elimination: Goal: Will not experience complications related to bowel motility Outcome: Progressing Goal: Will not experience complications related to urinary retention Outcome: Progressing   Problem: Pain Managment: Goal: General experience of comfort will improve Outcome: Progressing   Problem: Safety: Goal: Ability to remain free from injury will improve Outcome: Progressing   Problem: Skin Integrity: Goal: Risk for impaired skin integrity will decrease Outcome: Progressing   

## 2019-05-25 DIAGNOSIS — R339 Retention of urine, unspecified: Secondary | ICD-10-CM

## 2019-05-25 LAB — GLUCOSE, CAPILLARY
Glucose-Capillary: 170 mg/dL — ABNORMAL HIGH (ref 70–99)
Glucose-Capillary: 161 mg/dL — ABNORMAL HIGH (ref 70–99)
Glucose-Capillary: 168 mg/dL — ABNORMAL HIGH (ref 70–99)
Glucose-Capillary: 177 mg/dL — ABNORMAL HIGH (ref 70–99)

## 2019-05-25 LAB — CULTURE, BLOOD (ROUTINE X 2)
Culture: NO GROWTH
Culture: NO GROWTH
Special Requests: ADEQUATE
Special Requests: ADEQUATE

## 2019-05-25 MED ORDER — OXYCODONE HCL 5 MG PO TABS
2.5000 mg | ORAL_TABLET | ORAL | Status: DC | PRN
  Administered 2019-05-25 – 2019-05-27 (×5): 2.5 mg via ORAL
  Filled 2019-05-25 (×5): qty 1

## 2019-05-25 NOTE — Progress Notes (Signed)
PROGRESS NOTE    Danine Hor  DJM:[id number] DOB: 1960/11/28 DOA: 05/19/2019 PCP: Galvin Proffer, MD      Brief Narrative:  [name] is a 59 y.o. F with bipolar disorder, HTN, DM, hypothyroidism, and recent femur fracture if with fall, confusion, generalized weakness.  The ER, CT imaging of the back showed findings concerning for discitis versus osteomyelitis.  The patient was admitted to the hospital service.         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  These cultures grew E. coli, pansensitive, and so she has been started on ceftriaxone under the guidance of infectious disease.  Source appears to be primary seeding from her bacteremia several weeks ago at birth [facility] after UTI.  PICC line is in place.  She will need 6 weeks of IV antibiotics under the direction of infectious disease, at the moment we are pending skilled nursing placement. -Continue ceftriaxone until July 04, 2019   Urinary retention Patient noticed to have 500 cc urine retention yesterday.  She had serial I/O caths overnight, and still has 600 to 700 cc postvoid residual.  Her MRI does not show spinal cord compression, and I do not believe that her leg weakness and urinary retention the result of cord compromise. -Consult urology -Insert Foley for 1 week, voiding trial on or around 5/23    Vitamin D deficiency Patient noticed to have recent fragility fracture, osteopenia on x-ray, and markedly low vitamin D level.  Given 50,000 units of vitamin D -Needs endocrinology referral after discharge  Cerebral contusion Noted incidentally on CT.  At admission.  Stroke ruled out with MRI.  Hypothyroidism -Continue levothyroxine  Bipolar disorder -Continue lamotrigine, venlafaxine  Delirium Resolved -Avoid benzodiazepines  Hypertension Blood pressure controlled -Continue losartan,  HCTZ  Diabetes with peripheral neuropathy Glucose is well controlled -Continue sliding scale corrections -Continue Lantus -Continue gabapentin, baby aspirin, and simvastatin  Moderate protein calorie malnutrition  Pressure injury, sacrum, unstageable, POA Pressure injury buttocks, left, stage II, present on admission  Anemia of chronic disease Stable, no clinical bleeding        Disposition: Status is: Inpatient  The patient came in with discitis and psoas abscess.  She will require 6 weeks of IV antibiotics.   SNF placement    Dispo: The patient is from: Home              Anticipated d/c is to: SNF              Anticipated d/c date is: 1 day              Patient currently is medically stable to d/c.   The patient has been diagnosed with a discitis and psoas abscess by E. coli.  She will require IV antibiotics until June 25.  She is still unable to walk, will need extensive rehabilitation in order to regain her prior level of function.  Skilled nursing placement is pending, otherwise she has no safe discharge plan at this moment.         MDM: The below labs and imaging reports reviewed and summarized above.  Medication management as above.    DVT prophylaxis: SCD Code Status: Full code Family Communication: Sister at the bedside    Consultants:   Infectious disease  IR  Neurosurgery  Procedures:   5/12 CT-guided disc space aspiration  Antimicrobials:  Culture data:   5/11 blood culture -- ng  5/12 disc aspirate -- E coli, pansensitive          Subjective: No fever.  Back pain is well controlled.  Delirium is resolved.  No chest pain, respiratory distress.  No urge to urinate.  Leg weakness is stable.       Objective: Vitals:   05/24/19 0038 05/24/19 0738 05/25/19 0113 05/25/19 0743  BP:  128/75 (!) 149/89 119/76  Pulse: (!) 104 (!) 101 (!) 108 (!) 101  Resp: Temp:  97.8 F (36.6 C) 97.9 F (36.6 C) 98.2  F (36.8 C)  TempSrc:  Oral Oral   SpO2:  97% 100% 94%  Weight:      Height:        Intake/Output Summary (Last 24 hours) at 05/25/2019 1119 Last data filed at 05/25/2019 0500 Gross per 24 hour  Intake 240 ml  Output 1327 ml  Net -1087 ml   Filed Weights   05/19/19 1138 05/19/19 1718  Weight: 56.7 kg 57.5 kg    Examination: General appearance: Chronically ill-appearing adult female, lying in bed, no obvious distress.  Interactive.     HEENT: Anicteric, conjunctival pink, lids and lashes normal.  No nasal deformity, discharge, or epistaxis.  Oropharynx moist, no oral lesions, hearing normal. Skin: No suspicious rashes or lesions. Cardiac: RRR, no murmurs, JVP normal, no lower extremity edema Respiratory: Respiratory rate normal, lungs clear without rales or wheezes. Abdomen: Abdomen soft without tenderness palpation or guarding. MSK: Severe diffuse loss of subcutaneous muscle mass and fat.  There is bowleggedness, and deformity of the knees bilaterally. Neuro: Generalized weakness, worse in the legs.  Extraocular movements intact, speech fluent. Psych: Affect normal, judgment and insight appear normal.  Attention normal.           Data Reviewed: I have personally reviewed following labs and imaging studies:  CBC: Recent Labs  Lab 05/19/19 1153 05/19/19 1153 05/20/19 0340 05/21/19 0400 05/22/19 0501 05/23/19 0452 05/24/19 0526  WBC 12.7*   < > 14.1* 11.5* 11.4* 11.7* 10.7*  NEUTROABS 11.5*  --  12.0* 9.6*  --   --   --   HGB 11.0*   < > 9.9* 10.2* 9.9* 10.6* 9.8*  HCT 32.3*   < > 28.9* 30.9* 29.1* 30.8* 29.2*  MCV 88.7   < > 86.0 88.8 87.9 87.3 88.5  PLT 349   < > 327 382 351 367 350   < > = values in this interval not displayed.   Basic Metabolic Panel: Recent Labs  Lab 05/19/19 1153 05/19/19 2045 05/20/19 0340 05/20/19 0837 05/20/19 1733 05/21/19 0400 05/22/19 0501 05/23/19 0452 05/24/19 0526  NA 134*   < >   < >  --  137 136 138 137 136  K 2.6*    < >   < >  --  3.6 3.2* 4.1 3.9 3.7  CL 91*   < >   < >  --  96* 96* 100 98 98  CO2 29   < >   < >  --  32 GLUCOSE 242*   < >   < >  --  174* 168* 160* 187* 170*  BUN 36*   < >   < >  --  CREATININE 0.45   < >   < >  --  0.36* <0.30* 0.38* 0.38* 0.36*  CALCIUM  8.8*   < >   < >  --  8.7* 8.5* 8.4* 8.7* 8.6*  MG 1.7  --   --  1.4*  --  1.9  --   --   --   PHOS  --   --   --   --   --   --   --  3.3  --    < > = values in this interval not displayed.   GFR: Estimated Creatinine Clearance: 59.9 mL/min (A) (by C-G formula based on SCr of 0.36 mg/dL (L)). Liver Function Tests: Recent Labs  Lab 05/19/19 1153 05/23/19 1705  AST 15 14*  ALT 10 10  ALKPHOS 161* 138*  BILITOT 1.1 0.6  PROT 8.2* 6.9  ALBUMIN 3.0* 2.4*   No results for input(s): LIPASE, AMYLASE in the last 168 hours. No results for input(s): AMMONIA in the last 168 hours. Coagulation Profile: Recent Labs  Lab 05/19/19 1153  INR 1.1   Cardiac Enzymes: No results for input(s): CKTOTAL, CKMB, CKMBINDEX, TROPONINI in the last 168 hours. BNP (last 3 results) No results for input(s): PROBNP in the last 8760 hours. HbA1C: No results for input(s): HGBA1C in the last 72 hours. CBG: Recent Labs  Lab 05/24/19 0736 05/24/19 1231 05/24/19 1628 05/24/19 2200 05/25/19 0745  GLUCAP 163* 162* 173* 151* 170*   Lipid Profile: No results for input(s): CHOL, HDL, LDLCALC, TRIG, CHOLHDL, LDLDIRECT in the last 72 hours. Thyroid Function Tests: No results for input(s): TSH, T4TOTAL, FREET4, T3FREE, THYROIDAB in the last 72 hours. Anemia Panel: No results for input(s): VITAMINB12, FOLATE, FERRITIN, TIBC, IRON, RETICCTPCT in the last 72 hours. Urine analysis:    Component Value Date/Time   COLORURINE YELLOW (A) 05/20/2019 1500   APPEARANCEUR TURBID (A) 05/20/2019 1500   LABSPEC 1.004 (L) 05/20/2019 1500   PHURINE 5.0 05/20/2019 1500   GLUCOSEU NEGATIVE 05/20/2019 1500   HGBUR MODERATE (A)  05/20/2019 1500   BILIRUBINUR NEGATIVE 05/20/2019 1500   KETONESUR NEGATIVE 05/20/2019 1500   PROTEINUR NEGATIVE 05/20/2019 1500   NITRITE NEGATIVE 05/20/2019 1500   LEUKOCYTESUR LARGE (A) 05/20/2019 1500   Sepsis Labs: @LABRCNTIP (procalcitonin:4,lacticacidven:4)  ) Recent Results (from the past 240 hour(s))  SARS Coronavirus 2 by RT PCR (hospital order, performed in [place] hospital lab) Nasopharyngeal Nasopharyngeal Swab     Status: None   Collection Time: 05/19/19  1:47 PM   Specimen: Nasopharyngeal Swab  Result Value Ref Range Status   SARS Coronavirus 2 NEGATIVE NEGATIVE Final    Comment: (NOTE) SARS-CoV-2 target nucleic acids are NOT DETECTED. The SARS-CoV-2 RNA is generally detectable in upper and lower respiratory specimens during the acute phase of infection. The lowest concentration of SARS-CoV-2 viral copies this assay can detect is 250 copies / mL. A negative result does not preclude SARS-CoV-2 infection and should not be used as the sole basis for treatment or other patient management decisions.  A negative result may occur with improper specimen collection / handling, submission of specimen other than nasopharyngeal swab, presence of viral mutation(s) within the areas targeted by this assay, and inadequate number of viral copies (<250 copies / mL). A negative result must be combined with clinical observations, patient history, and epidemiological information. Fact Sheet for Patients:   StrictlyIdeas.no Fact Sheet for Healthcare Providers: BankingDealers.co.za This test is not yet approved or cleared  by the [place] FDA and has been authorized for detection and/or diagnosis of SARS-CoV-2 by FDA under an Emergency Use Authorization (EUA).  This EUA  will remain in effect (meaning this test can be used) for the duration of the COVID-19 declaration under Section 564(b)(1) of the Act, 21 U.S.C. section  360bbb-3(b)(1), unless the authorization is terminated or revoked sooner. Performed at [facility], [address] [place], [place] [address]   CULTURE, BLOOD (ROUTINE X 2) w Reflex to ID Panel     Status: None   Collection Time: 05/20/19  9:33 AM   Specimen: BLOOD  Result Value Ref Range Status   Specimen Description BLOOD RIGHT ANTECUBITAL  Final   Special Requests   Final    BOTTLES DRAWN AEROBIC AND ANAEROBIC Blood Culture adequate volume   Culture   Final    NO GROWTH 5 DAYS Performed at [facility], [address] [place], [place] [address]    Report Status 05/25/2019 FINAL  Final  CULTURE, BLOOD (ROUTINE X 2) w Reflex to ID Panel     Status: None   Collection Time: 05/20/19  9:43 AM   Specimen: BLOOD  Result Value Ref Range Status   Specimen Description BLOOD RIGHT WRIST  Final   Special Requests   Final    BOTTLES DRAWN AEROBIC AND ANAEROBIC Blood Culture adequate volume   Culture   Final    NO GROWTH 5 DAYS Performed at [facility], [address] [place], [place] [address]    Report Status 05/25/2019 FINAL  Final  MRSA PCR Screening     Status: None   Collection Time: 05/20/19  8:31 PM   Specimen: Nasopharyngeal  Result Value Ref Range Status   MRSA by PCR NEGATIVE NEGATIVE Final    Comment:        The GeneXpert MRSA Assay (FDA approved for NASAL specimens only), is one component of a comprehensive MRSA colonization surveillance program. It is not intended to diagnose MRSA infection nor to guide or monitor treatment for MRSA infections. Performed at [facility], [address] [place], [place] [address]   Aerobic/Anaerobic Culture (surgical/deep wound)     Status: None (Preliminary result)   Collection Time: 05/21/19  9:58 AM   Specimen: Abscess  Result Value Ref Range Status   Specimen Description   Final    ABSCESS Performed at [facility], [address], [place], [place] [address]    Special  Requests   Final    NONE Performed at [facility], [address] [place], [place] [address]    Gram Stain   Final    NO WBC SEEN RARE GRAM POSITIVE COCCI Performed at [facility], [address], [place], [place] [address]    Culture   Final    FEW ESCHERICHIA COLI NO ANAEROBES ISOLATED; CULTURE IN PROGRESS FOR 5 DAYS    Report Status PENDING  Incomplete   Organism ID, Bacteria ESCHERICHIA COLI  Final      Susceptibility   Escherichia coli - MIC*    AMPICILLIN <=2 SENSITIVE Sensitive     CEFAZOLIN <=4 SENSITIVE Sensitive     CEFEPIME <=1 SENSITIVE Sensitive     CEFTAZIDIME <=1 SENSITIVE Sensitive     CEFTRIAXONE <=1 SENSITIVE Sensitive     CIPROFLOXACIN <=0.25 SENSITIVE Sensitive     GENTAMICIN <=1 SENSITIVE Sensitive     IMIPENEM <=0.25 SENSITIVE Sensitive     TRIMETH/SULFA <=20 SENSITIVE Sensitive     AMPICILLIN/SULBACTAM <=2 SENSITIVE Sensitive     PIP/TAZO <=4 SENSITIVE Sensitive     * FEW ESCHERICHIA COLI  Aerobic/Anaerobic Culture (surgical/deep  wound)     Status: None (Preliminary result)   Collection Time: 05/21/19 10:01 AM   Specimen: Abscess  Result Value Ref Range Status   Specimen Description   Final    ABSCESS Performed at [facility], [address] [place], [place] [address]    Special Requests   Final    NONE Performed at [facility], [address] [place], [place] [address]    Gram Stain   Final    FEW WBC PRESENT,BOTH PMN AND MONONUCLEAR NO ORGANISMS SEEN Performed at [facility], [address] [place], [place] [address]    Culture   Final    RARE ESCHERICHIA COLI NO ANAEROBES ISOLATED; CULTURE IN PROGRESS FOR 5 DAYS    Report Status PENDING  Incomplete   Organism ID, Bacteria ESCHERICHIA COLI  Final      Susceptibility   Escherichia coli - MIC*    AMPICILLIN <=2 SENSITIVE Sensitive     CEFAZOLIN <=4 SENSITIVE Sensitive     CEFEPIME <=1 SENSITIVE Sensitive     CEFTAZIDIME <=1 SENSITIVE  Sensitive     CEFTRIAXONE <=1 SENSITIVE Sensitive     CIPROFLOXACIN <=0.25 SENSITIVE Sensitive     GENTAMICIN <=1 SENSITIVE Sensitive     IMIPENEM <=0.25 SENSITIVE Sensitive     TRIMETH/SULFA <=20 SENSITIVE Sensitive     AMPICILLIN/SULBACTAM <=2 SENSITIVE Sensitive     PIP/TAZO <=4 SENSITIVE Sensitive     * RARE ESCHERICHIA COLI         Radiology Studies: DG Knee 1-2 Views Right  Result Date: 05/23/2019 CLINICAL DATA:  Right knee and foot pain EXAM: RIGHT KNEE - 1-2 VIEW COMPARISON:  None. FINDINGS: No definite fracture or dislocation. There is advanced tricompartmental osteoarthritis with joint space loss and marginal osteophyte formation. Significant overlying soft tissue swelling seen around the medial aspect of the knee. A small knee joint effusion is seen. Dense vascular calcifications are noted. Diffuse osteopenia seen. IMPRESSION: No definite acute osseous abnormality. Advanced tricompartmental osteoarthritis. Soft tissue swelling over the medial aspect of the knee. Electronically Signed   By: Jonna Clark M.D.   On: 05/23/2019 19:49   DG Foot 2 Views Right  Result Date: 05/23/2019 CLINICAL DATA:  Right knee and foot pain EXAM: RIGHT FOOT - 2 VIEW COMPARISON:  None. FINDINGS: There is no evidence of fracture or dislocation. 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 Calcaneal enthesophytes are noted. Scattered vascular calcifications are seen. There is diffuse osteopenia. IMPRESSION: No acute osseous abnormality. Electronically Signed   By: Jonna Clark M.D.   On: 05/23/2019 19:48   [place] EKG SITE RITE  Result Date: 05/23/2019 If Site Rite image not attached, placement could not be confirmed due to current cardiac rhythm.       Scheduled Meds: . ascorbic acid  500 mg Oral BID  . aspirin EC  325 mg Oral Daily  . Chlorhexidine Gluconate Cloth  6 each Topical Daily  . collagenase   Topical Daily  . gabapentin  300 mg Oral TID  .  insulin aspart  0-15 Units Subcutaneous TID WC  . insulin glargine  15 Units Subcutaneous Daily  . lamoTRIgine  100 mg Oral BID  . levothyroxine  125 mcg Oral Daily  . multivitamin with minerals  1 tablet Oral Daily  . Ensure Max Protein  11 oz Oral BID BM  . simvastatin  10 mg Oral q1800  . sodium chloride flush  10-40 mL Intracatheter  Q12H  . venlafaxine XR  150 mg Oral Q breakfast  . Vitamin D (Ergocalciferol)  50,000 Units Oral Q7 days   Continuous Infusions: . cefTRIAXone (ROCEPHIN)  IV 2 g (05/25/19 1103)     LOS: 6 days    Time spent: 25 minutes minutes    Alberteen Sam, MD [facility] 05/25/2019, 11:19 AM     Please page though AMION or Epic secure chat:  For [employer], Higher education careers adviser

## 2019-05-25 NOTE — Consult Note (Addendum)
Urology Consult  Requesting physician: Joen Laura, MD  Reason for consultation: Urinary retention  Chief Complaint: N/A  History of Present Illness: Alicia Andrade is a 59 y.o. 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  History of frequent falls and altered mental status.  Recent hospital admission [facility] April 2020 with altered mental status and hypoglycemia.  Had E. coli UTI/bacteremia.  CT performed on 5/10 showed mild bladder distention and a small, nonobstructive left renal calculus.  Residuals during this hospitalization have ranged 300-500 cc.  She has been managed with intermittent catheterization.  She denies voiding difficulties but does relate to a 1 month history of urinary incontinence.  Denies dysuria, gross hematuria.   Past Medical History:  Diagnosis Date  . Bipolar 1 disorder (HCC)   . Diabetes mellitus without complication (HCC)   . Hypokalemia   . Hypothyroidism   . Major depressive disorder   . Rhabdomyolysis   . Thrombocytopenia (HCC)     History reviewed. No pertinent surgical history.  Home Medications:  Current Meds  Medication Sig  . ascorbic acid (VITAMIN C) 500 MG tablet Take 500 mg by mouth 2 (two) times daily.  . diclofenac Sodium (VOLTAREN) 1 % GEL Apply 4 g topically 4 (four) times daily.  Marland Kitchen gabapentin (NEURONTIN) 300 MG capsule Take 300 mg by mouth 3 (three) times daily.  . insulin aspart (NOVOLOG) 100 UNIT/ML injection Inject 1-8 Units into the skin 3 (three) times daily before meals. Sliding scales  Less than 160 0 units 160-199 1 unit 200-249 2 units 250-299 3 units 300-349 4 units 350-399 5 units 400-449 6 units 450-499 7 units 500 and higher 8 units  . insulin glargine (LANTUS) 100 unit/mL SOPN Inject 5 Units into the skin at bedtime.  . LamoTRIgine 200 MG TB24 24 hour tablet Take 1 tablet by mouth daily.  Marland Kitchen levothyroxine (SYNTHROID) 125 MCG  tablet Take 125 mcg by mouth daily.  . MULTIPLE VITAMINS-MINERALS PO Take by mouth.  . olmesartan-hydrochlorothiazide (BENICAR HCT) 20-12.5 MG tablet Take 1 tablet by mouth daily.  Marland Kitchen venlafaxine XR (EFFEXOR-XR) 150 MG 24 hr capsule Take 150 mg by mouth in the morning and at bedtime.     Allergies: No Known Allergies  History reviewed. No pertinent family history.  Social History:  reports that she has never smoked. She has never used smokeless tobacco. No history on file for alcohol and drug.  ROS: Noncontributory except as per the HPI  Physical Exam:  Vital signs in last 24 hours: Temp:  [97.9 F (36.6 C)-98.2 F (36.8 C)] 98.2 F (36.8 C) (05/16 0743) Pulse Rate:  [101-108] 101 (05/16 0743) Resp:  [16-19] 19 (05/16 0743) BP: (119-149)/(76-89) 119/76 (05/16 0743) SpO2:  [94 %-100 %] 94 % (05/16 0743) Constitutional:  Alert, cachectic HEENT: [employer] AT, moist mucus membranes.  Trachea midline, no masses Respiratory: Normal respiratory effort   Laboratory Data:  Recent Labs    05/23/19 0452 05/24/19 0526  WBC 11.7* 10.7*  HGB 10.6* 9.8*  HCT 30.8* 29.2*   Recent Labs    05/23/19 0452 05/24/19 0526  NA 137 136  K 3.9 3.7  CL 98 98  CO2 31 29  GLUCOSE 187* 170*  BUN 10 13  CREATININE 0.38* 0.36*  CALCIUM 8.7* 8.6*   No results for input(s): LABPT, INR in the last 72 hours. No results for input(s): LABURIN in the last 72 hours. Results for orders placed or  performed during the hospital encounter of 05/19/19  SARS Coronavirus 2 by RT PCR (hospital order, performed in [facility] hospital lab) Nasopharyngeal Nasopharyngeal Swab     Status: None   Collection Time: 05/19/19  1:47 PM   Specimen: Nasopharyngeal Swab  Result Value Ref Range Status   SARS Coronavirus 2 NEGATIVE NEGATIVE Final    Comment: (NOTE) SARS-CoV-2 target nucleic acids are NOT DETECTED. The SARS-CoV-2 RNA is generally detectable in upper and lower respiratory specimens during the acute phase of  infection. The lowest concentration of SARS-CoV-2 viral copies this assay can detect is 250 copies / mL. A negative result does not preclude SARS-CoV-2 infection and should not be used as the sole basis for treatment or other patient management decisions.  A negative result may occur with improper specimen collection / handling, submission of specimen other than nasopharyngeal swab, presence of viral mutation(s) within the areas targeted by this assay, and inadequate number of viral copies (<250 copies / mL). A negative result must be combined with clinical observations, patient history, and epidemiological information. Fact Sheet for Patients:   StrictlyIdeas.no Fact Sheet for Healthcare Providers: BankingDealers.co.za This test is not yet approved or cleared  by the [place] FDA and has been authorized for detection and/or diagnosis of SARS-CoV-2 by FDA under an Emergency Use Authorization (EUA).  This EUA will remain in effect (meaning this test can be used) for the duration of the COVID-19 declaration under Section 564(b)(1) of the Act, 21 U.S.C. section 360bbb-3(b)(1), unless the authorization is terminated or revoked sooner. Performed at [facility], [address] [place], [place] [address]   CULTURE, BLOOD (ROUTINE X 2) w Reflex to ID Panel     Status: None   Collection Time: 05/20/19  9:33 AM   Specimen: BLOOD  Result Value Ref Range Status   Specimen Description BLOOD RIGHT ANTECUBITAL  Final   Special Requests   Final    BOTTLES DRAWN AEROBIC AND ANAEROBIC Blood Culture adequate volume   Culture   Final    NO GROWTH 5 DAYS Performed at [facility], [address] [place], [place] [address]    Report Status 05/25/2019 FINAL  Final  CULTURE, BLOOD (ROUTINE X 2) w Reflex to ID Panel     Status: None   Collection Time: 05/20/19  9:43 AM   Specimen: BLOOD  Result Value Ref Range Status   Specimen  Description BLOOD RIGHT WRIST  Final   Special Requests   Final    BOTTLES DRAWN AEROBIC AND ANAEROBIC Blood Culture adequate volume   Culture   Final    NO GROWTH 5 DAYS Performed at [facility], [address] [place],  [address]    Report Status 05/25/2019 FINAL  Final  MRSA PCR Screening     Status: None   Collection Time: 05/20/19  8:31 PM   Specimen: Nasopharyngeal  Result Value Ref Range Status   MRSA by PCR NEGATIVE NEGATIVE Final    Comment:        The GeneXpert MRSA Assay (FDA approved for NASAL specimens only), is one component of a comprehensive MRSA colonization surveillance program. It is not intended to diagnose MRSA infection nor to guide or monitor treatment for MRSA infections. Performed at [facility], [address] [place],  [address]   Aerobic/Anaerobic Culture (surgical/deep wound)     Status: None (Preliminary result)   Collection Time: 05/21/19  9:58 AM   Specimen: Abscess  Result Value Ref  Range Status   Specimen Description   Final    ABSCESS Performed at [facility], [address] [place], [place] [address]    Special Requests   Final    NONE Performed at [facility], [address] [place], [place] [address]    Gram Stain   Final    NO WBC SEEN RARE GRAM POSITIVE COCCI Performed at [facility], [address], [place], [place] [address]    Culture   Final    FEW ESCHERICHIA COLI NO ANAEROBES ISOLATED; CULTURE IN PROGRESS FOR 5 DAYS    Report Status PENDING  Incomplete   Organism ID, Bacteria ESCHERICHIA COLI  Final      Susceptibility   Escherichia coli - MIC*    AMPICILLIN <=2 SENSITIVE Sensitive     CEFAZOLIN <=4 SENSITIVE Sensitive     CEFEPIME <=1 SENSITIVE Sensitive     CEFTAZIDIME <=1 SENSITIVE Sensitive     CEFTRIAXONE <=1 SENSITIVE Sensitive     CIPROFLOXACIN <=0.25 SENSITIVE Sensitive     GENTAMICIN <=1 SENSITIVE Sensitive     IMIPENEM <=0.25 SENSITIVE  Sensitive     TRIMETH/SULFA <=20 SENSITIVE Sensitive     AMPICILLIN/SULBACTAM <=2 SENSITIVE Sensitive     PIP/TAZO <=4 SENSITIVE Sensitive     * FEW ESCHERICHIA COLI  Aerobic/Anaerobic Culture (surgical/deep wound)     Status: None (Preliminary result)   Collection Time: 05/21/19 10:01 AM   Specimen: Abscess  Result Value Ref Range Status   Specimen Description   Final    ABSCESS Performed at [facility], [address], [place], [place] [address]    Special Requests   Final    NONE Performed at [facility], [address] [place], [place] [address]    Gram Stain   Final    FEW WBC PRESENT,BOTH PMN AND MONONUCLEAR NO ORGANISMS SEEN Performed at [facility], [address] [place], [place] [address]    Culture   Final    RARE ESCHERICHIA COLI NO ANAEROBES ISOLATED; CULTURE IN PROGRESS FOR 5 DAYS    Report Status PENDING  Incomplete   Organism ID, Bacteria ESCHERICHIA COLI  Final      Susceptibility   Escherichia coli - MIC*    AMPICILLIN <=2 SENSITIVE Sensitive     CEFAZOLIN <=4 SENSITIVE Sensitive     CEFEPIME <=1 SENSITIVE Sensitive     CEFTAZIDIME <=1 SENSITIVE Sensitive     CEFTRIAXONE <=1 SENSITIVE Sensitive     CIPROFLOXACIN <=0.25 SENSITIVE Sensitive     GENTAMICIN <=1 SENSITIVE Sensitive     IMIPENEM <=0.25 SENSITIVE Sensitive     TRIMETH/SULFA <=20 SENSITIVE Sensitive     AMPICILLIN/SULBACTAM <=2 SENSITIVE Sensitive     PIP/TAZO <=4 SENSITIVE Sensitive     * RARE ESCHERICHIA COLI     Radiologic Imaging: DG Knee 1-2 Views Right  Result Date: 05/23/2019 CLINICAL DATA:  Right knee and foot pain EXAM: RIGHT KNEE - 1-2 VIEW COMPARISON:  None. FINDINGS: No definite fracture or dislocation. There is advanced tricompartmental osteoarthritis with joint space loss and marginal osteophyte formation. Significant overlying soft tissue swelling seen around the medial aspect of the knee. A small knee joint effusion is seen. Dense vascular  calcifications are noted. Diffuse osteopenia seen. IMPRESSION: No definite acute osseous abnormality. Advanced tricompartmental osteoarthritis. Soft tissue swelling over the medial aspect of the knee. Electronically Signed   By: Jonna Clark M.D.   On: 05/23/2019 19:49   DG Foot  2 Views Right  Result Date: 05/23/2019 CLINICAL DATA:  Right knee and foot pain EXAM: RIGHT FOOT - 2 VIEW COMPARISON:  None. FINDINGS: There is no evidence of fracture or dislocation. 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 Calcaneal enthesophytes are noted. Scattered vascular calcifications are seen. There is diffuse osteopenia. IMPRESSION: No acute osseous abnormality. Electronically Signed   By: Jonna Clark M.D.   On: 05/23/2019 19:48   [place] EKG SITE RITE  Result Date: 05/23/2019 If Site Rite image not attached, placement could not be confirmed due to current cardiac rhythm.   Impression/Assessment:  59 y.o. female with incomplete bladder emptying.  Difficult to ascertain whether this is acute or chronic however may be related to her discitis and lumbar disc disease.  Recommendation:  1 week trial of Foley catheter drainage with catheter removal and voiding trial.  If she is discharged to an SNF an order can be written to DC Foley after 7-day dwell time and to check residuals after catheter removal.  Will schedule office follow-up appointment  I spent 45 total minutes on the day of the encounter including pre-visit review of the medical record, face-to-face time with the patient, and post visit documentation.  05/25/2019, 12:11 PM  Irineo Axon,  MD

## 2019-05-25 NOTE — Progress Notes (Signed)
Dressing change to sacrum per wound care orders- santly, wet gauze, foam. Dressing clean dry intact

## 2019-05-26 ENCOUNTER — Telehealth: Payer: Self-pay | Admitting: Urology

## 2019-05-26 LAB — SEDIMENTATION RATE: Sed Rate: 126 mm/hr — ABNORMAL HIGH (ref 0–30)

## 2019-05-26 LAB — BASIC METABOLIC PANEL
Anion gap: 9 (ref 5–15)
BUN: 15 mg/dL (ref 6–20)
CO2: 29 mmol/L (ref 22–32)
Calcium: 8.9 mg/dL (ref 8.9–10.3)
Chloride: 100 mmol/L (ref 98–111)
Creatinine, Ser: 0.36 mg/dL — ABNORMAL LOW (ref 0.44–1.00)
GFR calc Af Amer: >60 mL/min (ref >60)
GFR calc non Af Amer: >60 mL/min (ref >60)
Glucose, Bld: 193 mg/dL — ABNORMAL HIGH (ref 70–99)
Potassium: 3.6 mmol/L (ref 3.5–5.1)
Sodium: 138 mmol/L (ref 135–145)

## 2019-05-26 LAB — CBC
HCT: 28.5 % — ABNORMAL LOW (ref 36.0–46.0)
Hemoglobin: 9.7 g/dL — ABNORMAL LOW (ref 12.0–15.0)
MCH: 29.7 pg (ref 26.0–34.0)
MCHC: 34 g/dL (ref 30.0–36.0)
MCV: 87.2 fL (ref 80.0–100.0)
Platelets: 336 10*3/uL (ref 150–400)
RBC: 3.27 MIL/uL — ABNORMAL LOW (ref 3.87–5.11)
RDW: 14.8 % (ref 11.5–15.5)
WBC: 8.6 10*3/uL (ref 4.0–10.5)
nRBC: 0 % (ref 0.0–0.2)

## 2019-05-26 LAB — AEROBIC/ANAEROBIC CULTURE W GRAM STAIN (SURGICAL/DEEP WOUND): Gram Stain: NONE SEEN

## 2019-05-26 LAB — GLUCOSE, CAPILLARY
Glucose-Capillary: 188 mg/dL — ABNORMAL HIGH (ref 70–99)
Glucose-Capillary: 176 mg/dL — ABNORMAL HIGH (ref 70–99)
Glucose-Capillary: 106 mg/dL — ABNORMAL HIGH (ref 70–99)
Glucose-Capillary: 142 mg/dL — ABNORMAL HIGH (ref 70–99)

## 2019-05-26 LAB — SARS CORONAVIRUS 2 (TAT 6-24 HRS): SARS Coronavirus 2: NEGATIVE

## 2019-05-26 LAB — C-REACTIVE PROTEIN: CRP: 1.3 mg/dL — ABNORMAL HIGH (ref <1.0)

## 2019-05-26 MED ORDER — SENNA 8.6 MG PO TABS
1.0000 | ORAL_TABLET | Freq: Once | ORAL | Status: AC
  Administered 2019-05-26: 15:00:00 8.6 mg via ORAL
  Filled 2019-05-26: qty 1

## 2019-05-26 MED ORDER — ENOXAPARIN SODIUM 40 MG/0.4ML ~~LOC~~ SOLN
40.0000 mg | SUBCUTANEOUS | Status: DC
  Administered 2019-05-26: 40 mg via SUBCUTANEOUS
  Filled 2019-05-26: qty 0.4

## 2019-05-26 MED ORDER — RISAQUAD PO CAPS
1.0000 | ORAL_CAPSULE | Freq: Every day | ORAL | Status: DC
  Administered 2019-05-26 – 2019-05-28 (×3): 1 via ORAL
  Filled 2019-05-26 (×3): qty 1

## 2019-05-26 MED ORDER — POLYETHYLENE GLYCOL 3350 17 G PO PACK
17.0000 g | PACK | Freq: Every day | ORAL | Status: DC
  Administered 2019-05-26 – 2019-05-28 (×2): 17 g via ORAL
  Filled 2019-05-26 (×2): qty 1

## 2019-05-26 NOTE — TOC Progression Note (Addendum)
Transition of Care [facility]) - Progression Note    Patient Details  Name: Shelby Peltz MRN: [id number] Date of Birth: [birth year]  [facility]) CM/SW Contact  Emmilyn Crooke, Lemar Livings, LCSW Phone Number: 05/26/2019, 11:57 AM  Clinical Narrative:   Have given sister [employer] number to call to set up payment for pt to transfer to [facility]. She will call Berna Spare [phone] to discuss payment aware will be around 2400.00. Have asked MD for a new COVID test so once insurance approved can move her to [facility]  2:30 PM have not heard from facility if has gotten auth yet. Bedside RN to do COVID test today  Expected Discharge Plan: Skilled Nursing Facility Barriers to Discharge: Continued Medical Work up  Expected Discharge Plan and Services Expected Discharge Plan: Skilled Nursing Facility In-house Referral: Clinical Social Work     Living arrangements for the past 2 months: Single Family Home                                       Social Determinants of Health (SDOH) Interventions    Readmission Risk Interventions No flowsheet data found.

## 2019-05-26 NOTE — Telephone Encounter (Signed)
-----   Message from Riki Altes, MD sent at 05/25/2019 12:35 PM EDT ----- Regarding: Hospital follow-up Please schedule PA follow-up approximately 1 month for bladder scan/PVR

## 2019-05-26 NOTE — Progress Notes (Signed)
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 She has lost 120 lbs over the past 2 years. She has been having frequent falls. The patient lost so much weight that she actually believed that she no longer needed to take insulin. She was recently admitted to the hospital after she fell and broke her hip. She was found to have a glucose of more than 600.  Before that, however, she had been calling her sister and telling her that she was having difficulty at work learning new techniques and focusing. Since she has been admitted to SNF the patient has continued to have more alarming weight loss despite fair oral intake. She has a sacral pressure ulcer, recent sepsis due to UTI. She has known bipolar disorder, but her sister doesn't know if she takes her meds or not.  In the ED she is found to have severe hypokalemia, hypomagnesemia, leukocytosis, CT of the head has demonstrated an acute/subacute infarct of the mid to anterior left frontal lobe. There is also mild generalized parenchymal atrophy. CT of the abdomen demonstrated an unhealed comminuted fracture of the greater tuberosity of the proximal right femur. There is also lytic destruction involving the vertebral endplates of B5-1 disc space concerning for discitis and osteomyellitis. There is a small non-obstructive left renal calculus and a moderate sized fat-containing periumbilical hernia. There is a solitary gallstone noted. There is left hepatic pneumobilia due to prior sphincterotomy."  MRI of brain negative for acute/subacute infarction (L frontal abnormality shown by CT is most consistent with small hemorrhagic contusion).  Imaging also showing B psoas abscesses.  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  PMH  includes bipolar 1 disorder, DM, hypokalemia, major depressive disorder, rabdomyolysis, thrombocytopenia, stroke.   OT comments  Pt seen for OT treatment this date to f/u re: safety with self care ADLs/ADL mobility. Pt with improved pain level this date (having received medication ~45 mins prior to session). Pt tolerates sup to sit transition with MIN/MOD A +2, demos P+ static sitting balance requiring varying levels of support (from MIN to MOD) primarily for pain relief/splinting. Pt participates in UB g/h tasks with setup this date, demonstrating improved fxl activity tolerance, alternates with PT LB exercises, and then completes UB crossover punches with OT for 1 set x10 reps alternating.  Pt tolerates 10 mins static sitting this session. Overall, pt making progress towards goals. Anticipate SNF is still most appropriate d/c recommendation at this time.   Follow Up Recommendations  SNF    Equipment Recommendations       Recommendations for Other Services      Precautions / Restrictions Precautions Precautions: Back;Fall Precaution Booklet Issued: No Precaution Comments: infectious discitis and osteomyelitis L3-4; B psoas abscesses; per Neurosurgery note pt can be offered a LSO brace to use when upright if having considerable amount of back pain Restrictions Weight Bearing Restrictions: Yes Other Position/Activity Restrictions: Per pt's older sister (regarding R femur fx April 2021), ortho recommended non-op R hip fx but pt could put weight through R LE (although pt tends to put minimal weight through R LE and usually pivots on L LE to chair)       Mobility Bed Mobility Overal bed mobility: Needs Assistance Bed Mobility: Supine to Sit;Sit  to Supine     Supine to sit: Min assist;Mod assist;+2 for physical assistance;HOB elevated Sit to supine: Mod assist;+2 for physical assistance;HOB elevated   General bed mobility comments: increased assist for return to supine primarily to manage  pt's pain  Transfers                 General transfer comment: Deferred d/t low back pain    Balance Overall balance assessment: Needs assistance Sitting-balance support: Bilateral upper extremity supported;Feet supported Sitting balance-Leahy Scale: Poor Sitting balance - Comments: varied between MIN to MOD A for sitting balance/R low back support (assist provided more for decreasing R LBP than for sitting balance) Postural control: Posterior lean                                 ADL either performed or assessed with clinical judgement   ADL Overall ADL's : Needs assistance/impaired     Grooming: Wash/dry face;Oral care;Set up;Sitting Grooming Details (indicate cue type and reason): demos improved tolerance for participation in self care and P+ static sitting balance.                                     Vision Patient Visual Report: No change from baseline     Perception     Praxis      Cognition Arousal/Alertness: Awake/alert Behavior During Therapy: WFL for tasks assessed/performed Overall Cognitive Status: Within Functional Limits for tasks assessed                                 General Comments: Oriented to person, place, month/year, and situation        Exercises Total Joint Exercises Ankle Circles/Pumps: AROM;Strengthening;Both;10 reps;Seated Other Exercises Other Exercises: OT facilitates pt paticicpation in functional reaching-crossing midline/FWD punches, alternating for 1 set x10 reps.   Shoulder Instructions       General Comments      Pertinent Vitals/ Pain       Pain Assessment: Faces Pain Score: 5  Faces Pain Scale: Hurts even more Pain Location: low back Pain Descriptors / Indicators: Constant;Discomfort;Guarding;Sore;Tender Pain Intervention(s): Limited activity within patient's tolerance;Monitored during session;Premedicated before session;Repositioned  Home Living                                           Prior Functioning/Environment              Frequency  Min 2X/week        Progress Toward Goals  OT Goals(current goals can now be found in the care plan section)  Progress towards OT goals: Progressing toward goals  Acute Rehab OT Goals Patient Stated Goal: to improve pain OT Goal Formulation: With patient Time For Goal Achievement: 06/08/19 Potential to Achieve Goals: Fair  Plan      Co-evaluation    PT/OT/SLP Co-Evaluation/Treatment: Yes Reason for Co-Treatment: Complexity of the patient's impairments (multi-system involvement);For patient/therapist safety;To address functional/ADL transfers PT goals addressed during session: Mobility/safety with mobility;Balance;Strengthening/ROM OT goals addressed during session: ADL's and self-care;Strengthening/ROM      AM-PAC OT "6 Clicks" Daily Activity     Outcome Measure   Help from another person eating meals?:  A Little Help from another person taking care of personal grooming?: A Little Help from another person toileting, which includes using toliet, bedpan, or urinal?: A Lot Help from another person bathing (including washing, rinsing, drying)?: A Lot Help from another person to put on and taking off regular upper body clothing?: A Lot Help from another person to put on and taking off regular lower body clothing?: A Lot 6 Click Score: 14    End of Session    OT Visit Diagnosis: Muscle weakness (generalized) (M62.81)   Activity Tolerance Patient limited by pain   Patient Left in bed;with call bell/phone within reach;with nursing/sitter in room   Nurse Communication          Time: 5366-4403 OT Time Calculation (min): 36 min  Charges: OT General Charges $OT Visit: 1 Visit OT Treatments $Self Care/Home Management : 8-22 mins  Rejeana Brock, MS, OTR/L ascom [phone] 05/26/19, 4:21 PM

## 2019-05-26 NOTE — Telephone Encounter (Signed)
App made and mailed ° °Michelle ° °

## 2019-05-26 NOTE — Progress Notes (Addendum)
   05/26/19 0800  Gastrointestinal  Last BM Date 05/21/19  Notified MD of date of last BM Verbal order see MAR

## 2019-05-26 NOTE — Progress Notes (Addendum)
PROGRESS NOTE    Alicia Andrade  VFI:[id number] DOB: [birth year] DOA: 05/19/2019 PCP: Galvin Proffer, MD      Brief Narrative:  [name] is a 59 y.o. F with bipolar disorder, HTN, DM, hypothyroidism, and recent femur fracture if with fall, confusion, generalized weakness.  The ER, CT imaging of the back showed findings concerning for discitis versus osteomyelitis.  The patient was admitted to the hospital service.         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  These cultures grew E. coli, pansensitive, and so she has been started on ceftriaxone under the guidance of infectious disease.  Source appears to be primary seeding from her bacteremia several weeks ago at [facility] after UTI.  PICC line is in place.  She will need 6 weeks of IV antibiotics under the direction of infectious disease, at the moment we are pending skilled nursing placement.  -Continue ceftriaxone until July 04, 2019 -TLSO brace ordered   Urinary retention Patient noticed to have 500 cc urine retention in last several days.   She had serial I/O caths for 24 hours, and still has 600 to 700 cc postvoid residual.  Her MRI does not show spinal cord compression, and I do not believe that her leg weakness and urinary retention the result of cord compromise. -Maintain Foley for 1 week, voiding trial on or around 5/23  Fracture of greater tuberosity of right femur   Hypothyroidism -Continue levothyroxine  Bipolar disorder -Continue lamotrigine, venlafaxine   Hypertension Blood pressure normal mostly -Continue losartan, HCTZ  Diabetes with peripheral neuropathy Glucose controlled -Continue sliding scale corrections -Continue Lantus -Continue gabapentin, baby aspirin, and simvastatin  Moderate protein calorie malnutrition  Pressure injury, sacrum, unstageable, POA Pressure injury  buttocks, left, stage II, present on admission  Anemia of chronic disease Stable, no clinical bleeding  Vitamin D deficiency Patient noticed to have recent fragility fracture, osteopenia on x-ray, and markedly low vitamin D level.  Given 50,000 units of vitamin D -Needs endocrinology referral after discharge  Cerebral contusion Noted incidentally on CT.  At admission.  Stroke ruled out with MRI.      Disposition: Status is: Inpatient  The patient came in with discitis and psoas abscess.  She will require 6 weeks of IV antibiotics.   SNF placement    Dispo: The patient is from: Home              Anticipated d/c is to: SNF              Anticipated d/c date is: 1 day              Patient currently is medically stable to d/c.   The patient has been diagnosed with a discitis and psoas abscess by E. coli.  She will require IV antibiotics until June 25.  She is still unable to walk, will need extensive rehabilitation in order to regain her prior level of function.  Skilled nursing placement is pending, otherwise she has no safe discharge plan at this moment.         MDM: The below labs and imaging reports reviewed and summarized above.  Medication management as above.    DVT prophylaxis: SCD Code Status: Full code Family Communication: Sister at the bedside    Consultants:   Infectious disease  IR  Neurosurgery  Procedures:   5/10 CT head and  cspine  5/10 CT abdomen and pelvis -- L3-4 endplate destruction, pneumobilia  5/10 MR brain - No acute or subacute infarction. The left frontal abnormality shown by CT is most consistent with a small hemorrhagic contusion.  5/10 MRI cervical thoracic and lumbar spine -- L3-4 discitis with psoas abscess  5/12 CT-guided disc space aspiration -- successful aspiration of psoas abscess  Antimicrobials:    Ceftriaxone 5/14 >>  Culture data:   5/11 blood culture -- no growth  5/12 disc aspirate -- E coli,  pansensitive          Subjective: No fever.  Back pain is well controlled.  Delirium is resolved.  No chest pain, respiratory distress.  No urge to urinate.  Leg weakness is stable.       Objective: Vitals:   05/25/19 0743 05/25/19 1701 05/26/19 0016 05/26/19 0742  BP: 119/76 122/68 112/79 117/75  Pulse: (!) 101 (!) 104 (!) 101 (!) 104  Resp: 19 17 16 17   Temp: 98.2 F (36.8 C) 98.3 F (36.8 C) 97.8 F (36.6 C) 98.3 F (36.8 C)  TempSrc:   Oral   SpO2: 94% 99% 98% 99%  Weight:      Height:        Intake/Output Summary (Last 24 hours) at 05/26/2019 1608 Last data filed at 05/26/2019 05/28/2019 Gross per 24 hour  Intake --  Output 1575 ml  Net -1575 ml   Filed Weights   05/19/19 1138 05/19/19 1718  Weight: 56.7 kg 57.5 kg    Examination: General appearance: Chronically ill-appearing adult female, lying in bed, no obvious distress.  Interactive.     HEENT: Anicteric, conjunctival pink, lids and lashes normal.  No nasal deformity, discharge, or epistaxis.  Oropharynx moist, no oral lesions, hearing normal. Skin: No suspicious rashes or lesions. Cardiac: RRR, no murmurs, JVP normal, no lower extremity edema Respiratory: Respiratory rate normal, lungs clear without rales or wheezes. Abdomen: Abdomen soft without tenderness palpation or guarding. MSK: Severe diffuse loss of subcutaneous muscle mass and fat.  There is bowleggedness, and deformity of the knees bilaterally. Neuro: Generalized weakness, worse in the legs.  Extraocular movements intact, speech fluent. Psych: Affect normal, judgment and insight appear normal.  Attention normal.           Data Reviewed: I have personally reviewed following labs and imaging studies:  CBC: Recent Labs  Lab 05/20/19 0340 05/20/19 0340 05/21/19 0400 05/22/19 0501 05/23/19 0452 05/24/19 0526 05/26/19 0649  WBC 14.1*   < > 11.5* 11.4* 11.7* 10.7* 8.6  NEUTROABS 12.0*  --  9.6*  --   --   --   --   HGB 9.9*   < >  10.2* 9.9* 10.6* 9.8* 9.7*  HCT 28.9*   < > 30.9* 29.1* 30.8* 29.2* 28.5*  MCV 86.0   < > 88.8 87.9 87.3 88.5 87.2  PLT 327   < > 382 351 367 350 336   < > = values in this interval not displayed.   Basic Metabolic Panel: Recent Labs  Lab 05/20/19 0837 05/20/19 1733 05/21/19 0400 05/22/19 0501 05/23/19 0452 05/24/19 0526 05/26/19 0649  NA  --    < > 136 138 137 136 138  K  --    < > 3.2* 4.1 3.9 3.7 3.6  CL  --    < > 96* 100 98 98 100  CO2  --    < > 30 30 31 29 29   GLUCOSE  --    < >  168* 160* 187* 170* 193*  BUN  --    < > 12 10 10 13 15   CREATININE  --    < > <0.30* 0.38* 0.38* 0.36* 0.36*  CALCIUM  --    < > 8.5* 8.4* 8.7* 8.6* 8.9  MG 1.4*  --  1.9  --   --   --   --   PHOS  --   --   --   --  3.3  --   --    < > = values in this interval not displayed.   GFR: Estimated Creatinine Clearance: 59.9 mL/min (A) (by C-G formula based on SCr of 0.36 mg/dL (L)). Liver Function Tests: Recent Labs  Lab 05/23/19 1705  AST 14*  ALT 10  ALKPHOS 138*  BILITOT 0.6  PROT 6.9  ALBUMIN 2.4*   No results for input(s): LIPASE, AMYLASE in the last 168 hours. No results for input(s): AMMONIA in the last 168 hours. Coagulation Profile: No results for input(s): INR, PROTIME in the last 168 hours. Cardiac Enzymes: No results for input(s): CKTOTAL, CKMB, CKMBINDEX, TROPONINI in the last 168 hours. BNP (last 3 results) No results for input(s): PROBNP in the last 8760 hours. HbA1C: No results for input(s): HGBA1C in the last 72 hours. CBG: Recent Labs  Lab 05/25/19 1204 05/25/19 1659 05/25/19 2113 05/26/19 0738 05/26/19 1154  GLUCAP 161* 168* 177* 188* 176*   Lipid Profile: No results for input(s): CHOL, HDL, LDLCALC, TRIG, CHOLHDL, LDLDIRECT in the last 72 hours. Thyroid Function Tests: No results for input(s): TSH, T4TOTAL, FREET4, T3FREE, THYROIDAB in the last 72 hours. Anemia Panel: No results for input(s): VITAMINB12, FOLATE, FERRITIN, TIBC, IRON, RETICCTPCT in the  last 72 hours. Urine analysis:    Component Value Date/Time   COLORURINE YELLOW (A) 05/20/2019 1500   APPEARANCEUR TURBID (A) 05/20/2019 1500   LABSPEC 1.004 (L) 05/20/2019 1500   PHURINE 5.0 05/20/2019 1500   GLUCOSEU NEGATIVE 05/20/2019 1500   HGBUR MODERATE (A) 05/20/2019 1500   BILIRUBINUR NEGATIVE 05/20/2019 1500   KETONESUR NEGATIVE 05/20/2019 1500   PROTEINUR NEGATIVE 05/20/2019 1500   NITRITE NEGATIVE 05/20/2019 1500   LEUKOCYTESUR LARGE (A) 05/20/2019 1500   Sepsis Labs: @LABRCNTIP (procalcitonin:4,lacticacidven:4)  ) Recent Results (from the past 240 hour(s))  SARS Coronavirus 2 by RT PCR (hospital order, performed in [facility] hospital lab) Nasopharyngeal Nasopharyngeal Swab     Status: None   Collection Time: 05/19/19  1:47 PM   Specimen: Nasopharyngeal Swab  Result Value Ref Range Status   SARS Coronavirus 2 NEGATIVE NEGATIVE Final    Comment: (NOTE) SARS-CoV-2 target nucleic acids are NOT DETECTED. The SARS-CoV-2 RNA is generally detectable in upper and lower respiratory specimens during the acute phase of infection. The lowest concentration of SARS-CoV-2 viral copies this assay can detect is 250 copies / mL. A negative result does not preclude SARS-CoV-2 infection and should not be used as the sole basis for treatment or other patient management decisions.  A negative result may occur with improper specimen collection / handling, submission of specimen other than nasopharyngeal swab, presence of viral mutation(s) within the areas targeted by this assay, and inadequate number of viral copies (<250 copies / mL). A negative result must be combined with clinical observations, patient history, and epidemiological information. Fact Sheet for Patients:   BoilerBrush.com.cy Fact Sheet for Healthcare Providers: https://pope.com/ This test is not yet approved or cleared  by the [place] FDA and has been authorized  for detection and/or diagnosis  of SARS-CoV-2 by FDA under an Emergency Use Authorization (EUA).  This EUA will remain in effect (meaning this test can be used) for the duration of the COVID-19 declaration under Section 564(b)(1) of the Act, 21 U.S.C. section 360bbb-3(b)(1), unless the authorization is terminated or revoked sooner. Performed at [facility], [address] [place], [place] [address]   CULTURE, BLOOD (ROUTINE X 2) w Reflex to ID Panel     Status: None   Collection Time: 05/20/19  9:33 AM   Specimen: BLOOD  Result Value Ref Range Status   Specimen Description BLOOD RIGHT ANTECUBITAL  Final   Special Requests   Final    BOTTLES DRAWN AEROBIC AND ANAEROBIC Blood Culture adequate volume   Culture   Final    NO GROWTH 5 DAYS Performed at [facility], [address] [place], [place] [address]    Report Status 05/25/2019 FINAL  Final  CULTURE, BLOOD (ROUTINE X 2) w Reflex to ID Panel     Status: None   Collection Time: 05/20/19  9:43 AM   Specimen: BLOOD  Result Value Ref Range Status   Specimen Description BLOOD RIGHT WRIST  Final   Special Requests   Final    BOTTLES DRAWN AEROBIC AND ANAEROBIC Blood Culture adequate volume   Culture   Final    NO GROWTH 5 DAYS Performed at [facility], [address] [place], [place] [address]    Report Status 05/25/2019 FINAL  Final  MRSA PCR Screening     Status: None   Collection Time: 05/20/19  8:31 PM   Specimen: Nasopharyngeal  Result Value Ref Range Status   MRSA by PCR NEGATIVE NEGATIVE Final    Comment:        The GeneXpert MRSA Assay (FDA approved for NASAL specimens only), is one component of a comprehensive MRSA colonization surveillance program. It is not intended to diagnose MRSA infection nor to guide or monitor treatment for MRSA infections. Performed at [facility], [address] [place], [place] [address]   Aerobic/Anaerobic Culture (surgical/deep wound)      Status: None   Collection Time: 05/21/19  9:58 AM   Specimen: Abscess  Result Value Ref Range Status   Specimen Description   Final    ABSCESS Performed at [facility], [address], [place], [place] [address]    Special Requests   Final    NONE Performed at [facility], [address] [place], [place] [address]    Gram Stain NO WBC SEEN RARE GRAM POSITIVE COCCI   Final   Culture   Final    FEW ESCHERICHIA COLI NO ANAEROBES ISOLATED Performed at [facility], [address] [place], [place] [address]    Report Status 05/26/2019 FINAL  Final   Organism ID, Bacteria ESCHERICHIA COLI  Final      Susceptibility   Escherichia coli - MIC*    AMPICILLIN <=2 SENSITIVE Sensitive     CEFAZOLIN <=4 SENSITIVE Sensitive     CEFEPIME <=1 SENSITIVE Sensitive     CEFTAZIDIME <=1 SENSITIVE Sensitive     CEFTRIAXONE <=1 SENSITIVE Sensitive     CIPROFLOXACIN <=0.25 SENSITIVE Sensitive     GENTAMICIN <=1 SENSITIVE Sensitive     IMIPENEM <=0.25 SENSITIVE Sensitive     TRIMETH/SULFA <=20 SENSITIVE Sensitive     AMPICILLIN/SULBACTAM <=2 SENSITIVE Sensitive     PIP/TAZO <=4 SENSITIVE Sensitive     * FEW ESCHERICHIA COLI  Aerobic/Anaerobic Culture (  surgical/deep wound)     Status: None   Collection Time: 05/21/19 10:01 AM   Specimen: Abscess  Result Value Ref Range Status   Specimen Description   Final    ABSCESS Performed at [facility], [address], [place], [place] [address]    Special Requests   Final    NONE Performed at [facility], [address] [place], [place] [address]    Gram Stain   Final    FEW WBC PRESENT,BOTH PMN AND MONONUCLEAR NO ORGANISMS SEEN    Culture   Final    RARE ESCHERICHIA COLI NO ANAEROBES ISOLATED Performed at [facility], [place] [address] [place], [place] [address]    Report Status 05/26/2019 FINAL  Final   Organism ID, Bacteria ESCHERICHIA COLI  Final      Susceptibility   Escherichia  coli - MIC*    AMPICILLIN <=2 SENSITIVE Sensitive     CEFAZOLIN <=4 SENSITIVE Sensitive     CEFEPIME <=1 SENSITIVE Sensitive     CEFTAZIDIME <=1 SENSITIVE Sensitive     CEFTRIAXONE <=1 SENSITIVE Sensitive     CIPROFLOXACIN <=0.25 SENSITIVE Sensitive     GENTAMICIN <=1 SENSITIVE Sensitive     IMIPENEM <=0.25 SENSITIVE Sensitive     TRIMETH/SULFA <=20 SENSITIVE Sensitive     AMPICILLIN/SULBACTAM <=2 SENSITIVE Sensitive     PIP/TAZO <=4 SENSITIVE Sensitive     * RARE ESCHERICHIA COLI         Radiology Studies: No results found.      Scheduled Meds: . ascorbic acid  500 mg Oral BID  . aspirin EC  325 mg Oral Daily  . Chlorhexidine Gluconate Cloth  6 each Topical Daily  . collagenase   Topical Daily  . enoxaparin (LOVENOX) injection  40 mg Subcutaneous Q24H  . gabapentin  300 mg Oral TID  . insulin aspart  0-15 Units Subcutaneous TID WC  . insulin glargine  15 Units Subcutaneous Daily  . lamoTRIgine  100 mg Oral BID  . levothyroxine  125 mcg Oral Daily  . multivitamin with minerals  1 tablet Oral Daily  . polyethylene glycol  17 g Oral Daily  . Ensure Max Protein  11 oz Oral BID BM  . simvastatin  10 mg Oral q1800  . sodium chloride flush  10-40 mL Intracatheter Q12H  . venlafaxine XR  150 mg Oral Q breakfast  . Vitamin D (Ergocalciferol)  50,000 Units Oral Q7 days   Continuous Infusions: . cefTRIAXone (ROCEPHIN)  IV 2 g (05/26/19 0843)     LOS: 7 days    Time spent: 25 minutes    Edwin Dada, MD [facility] 05/26/2019, 4:08 PM     Please page though [place] or Epic secure chat:  For [employer], Adult nurse

## 2019-05-26 NOTE — Progress Notes (Signed)
ID Pt says she is feeling a lot better More alert Says less confused Back pain better OT helped her sit at the edge of bed  Patient Vitals for the past 24 hrs:  BP Temp Temp src Pulse Resp SpO2  05/26/19 0742 117/75 98.3 F (36.8 C) -- (!) 104 17 99 %  05/26/19 0016 112/79 97.8 F (36.6 C) Oral (!) 101 16 98 %  05/25/19 1701 122/68 98.3 F (36.8 C) -- (!) 104 17 99 %   O/E awake and alert Chronically ill Chest brace B/l knee bony enlargement   CBC Latest Ref Rng & Units 05/26/2019 05/24/2019 05/23/2019  WBC 4.0 - 10.5 K/uL 8.6 10.7(H) 11.7(H)  Hemoglobin 12.0 - 15.0 g/dL 2.5(K) 5.3(Z) 10.6(L)  Hematocrit 36.0 - 46.0 % 28.5(L) 29.2(L) 30.8(L)  Platelets 150 - 400 K/uL 336 350 367     CMP Latest Ref Rng & Units 05/26/2019 05/24/2019 05/23/2019  Glucose 70 - 99 mg/dL 767(H) 419(F) 790(W)  BUN 6 - 20 mg/dL 15 13 10   Creatinine 0.44 - 1.00 mg/dL ) 4.09(B) 3.53(G)  Sodium 135 - 145 mmol/L 138 136 137  Potassium 3.5 - 5.1 mmol/L 3.6 3.7 3.9  Chloride 98 - 111 mmol/L 100 98 98  CO2 22 - 32 mmol/L 29 29 31   Calcium 8.9 - 10.3 mg/dL 8.9 9.92(E) )  Total Protein 6.5 - 8.1 g/dL - - 6.9  Total Bilirubin 0.3 - 1.2 mg/dL - - 0.6  Alkaline Phos 38 - 126 U/L - - 138(H)  AST 15 - 41 U/L - - 14(L)  ALT 0 - 44 U/L - - 10     Impression/Recommendation 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   Incomplete Bladder emptying- seen by Dr.Stioff- for foley trial for a week  Fracture due to frequent fall, abnormal gait for many years as per sister ,  Low vitamin D, osteopenia She got a 50,0000 units dose of vitamin D - will need weekly dose for 4 weeks but  need to follow up with endocrinologist to manage this.  Diabetes mellitus poorly controlled. In April 2020 hemoglobin was more than 14 but now it is 8.8. ? Weight loss and malnutrition.     Discussed the management  with the patient and her sister Waiting for insurance approval to [facility] to [facility] near [place]

## 2019-05-26 NOTE — TOC Progression Note (Signed)
Transition of Care [facility]) - Progression Note    Patient Details  Name: Alicia Andrade MRN: [id number] Date of Birth: 27-Dec-1960  [facility] CM/SW Contact  Niko Penson, Lemar Livings, LCSW Phone Number: 05/26/2019, 9:19 AM  Clinical Narrative:   MD feels pt is medically stable for transfer to next venue. Have contacted [facility] to start insurance auth and have faxed clinicals. Spoke with Francina Ames who will begin auth. Will also need to set up non-emergency ambulance for trip and will take one day in advance to reserve a truck. Pt and sister aware of the plans and in agreement    Expected Discharge Plan: Skilled Nursing Facility Barriers to Discharge: Continued Medical Work up  Expected Discharge Plan and Services Expected Discharge Plan: Skilled Nursing Facility In-house Referral: Clinical Social Work     Living arrangements for the past 2 months: Single Family Home                                       Social Determinants of Health (SDOH) Interventions    Readmission Risk Interventions No flowsheet data found.

## 2019-05-26 NOTE — Progress Notes (Signed)
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 She has lost 120 lbs over the past 2 years. She has been having frequent falls. The patient lost so much weight that she actually believed that she no longer needed to take insulin. She was recently admitted to the hospital after she fell and broke her hip. She was found to have a glucose of more than 600.  Before that, however, she had been calling her sister and telling her that she was having difficulty at work learning new techniques and focusing. Since she has been admitted to SNF the patient has continued to have more alarming weight loss despite fair oral intake. She has a sacral pressure ulcer, recent sepsis due to UTI. She has known bipolar disorder, but her sister doesn't know if she takes her meds or not.  In the ED she is found to have severe hypokalemia, hypomagnesemia, leukocytosis, CT of the head has demonstrated an acute/subacute infarct of the mid to anterior left frontal lobe. There is also mild generalized parenchymal atrophy. CT of the abdomen demonstrated an unhealed comminuted fracture of the greater tuberosity of the proximal right femur. 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 There is a small non-obstructive left renal calculus and a moderate sized fat-containing periumbilical hernia. There is a solitary gallstone noted. There is left hepatic pneumobilia due to prior sphincterotomy."  MRI of brain negative for acute/subacute infarction (L frontal abnormality shown by CT is most consistent with small hemorrhagic contusion).  Imaging also showing B psoas abscesses.  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  PMH includes  bipolar 1 disorder, DM, hypokalemia, major depressive disorder, rabdomyolysis, thrombocytopenia, stroke.    PT Comments    Pt seen for PT/OT co-treatment.  Pt resting in bed upon therapy arrival with pt's sister and sister's husband present (both left beginning of session).  Pt recently received pain medication and reporting 4/10 low back pain beginning of session at rest.  Able to perform bed mobility with 2 assist (increased assist provided to minimize increase in pain).  Then pt able to sit on edge of bed performing ex's and ADL's with therapists x10 minutes (min to mod assist support provided to pt's R low back entire time sitting edge of bed to minimize increase in R low back pain).  Pain 5/10 R low back at rest in bed end of session.  D/t pt requiring support to R low back in order to tolerate sitting edge of bed, discussed possibility of back brace (previously mentioned in Neurosurgery note) and pt appearing interested in back brace to help manage her back pain (LSO brace-that was previously mentioned in Neurosurgery note-discussed with pt's nurse who reported she would look into it).  Will continue to focus on strengthening and progressive functional mobility per pt tolerance.   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; B psoas abscesses; per Neurosurgery note pt can be offered a LSO  brace to use when upright if having considerable amount of back pain Restrictions Weight Bearing Restrictions: Yes Other Position/Activity Restrictions: Per pt's older sister (regarding R femur fx April 2021), ortho recommended non-op R hip fx but pt could put  weight through R LE (although pt tends to put minimal weight through R LE and usually pivots on L LE to chair)    Mobility  Bed Mobility Overal bed mobility: Needs Assistance Bed Mobility: Supine to Sit;Sit to Supine     Supine to sit: Min assist;Mod assist;+2 for physical assistance;HOB elevated Sit to supine: Mod assist;+2 for physical assistance;HOB elevated   General bed mobility comments: assist for trunk and B LE's semi-supine to/from sitting edge of bed; increased assist provided to prevent increase in pain; 2 assist to boost pt up in bed end of session using bed sheets  Transfers                 General transfer comment: Deferred d/t low back pain  Ambulation/Gait                 Stairs             Wheelchair Mobility    Modified Rankin (Stroke Patients Only)       Balance Overall balance assessment: Needs assistance Sitting-balance support: Bilateral upper extremity supported;Feet supported Sitting balance-Leahy Scale: Poor Sitting balance - Comments: varied between min to mod assist for sitting balance/R low back support (assist provided more for decreasing R LBP than for sitting balance) Postural control: Posterior lean                                  Cognition Arousal/Alertness: Awake/alert Behavior During Therapy: WFL for tasks assessed/performed Overall Cognitive Status: Within Functional Limits for tasks assessed                                 General Comments: Oriented to person, place, month/year, and situation      Exercises Total Joint Exercises Ankle Circles/Pumps: AROM;Strengthening;Both;10 reps;Seated    General Comments   Nursing cleared pt for participation in physical therapy.  Pt agreeable to PT session.      Pertinent Vitals/Pain Pain Assessment: 0-10 Pain Score: 5  Pain Location: low back Pain Descriptors / Indicators: Constant;Discomfort;Guarding;Sore;Tender Pain Intervention(s):  Limited activity within patient's tolerance;Monitored during session;Repositioned;Premedicated before session    Home Living                      Prior Function            PT Goals (current goals can now be found in the care plan section) Acute Rehab PT Goals Patient Stated Goal: to improve pain PT Goal Formulation: With patient Time For Goal Achievement: 06/06/19 Potential to Achieve Goals: Fair Progress towards PT goals: Progressing toward goals    Frequency    Min 2X/week      PT Plan Current plan remains appropriate    Co-evaluation PT/OT/SLP Co-Evaluation/Treatment: Yes Reason for Co-Treatment: Complexity of the patient's impairments (multi-system involvement);For patient/therapist safety;To address functional/ADL transfers PT goals addressed during session: Mobility/safety with mobility;Balance;Strengthening/ROM OT goals addressed during session: ADL's and self-care;Strengthening/ROM      AM-PAC PT "6 Clicks" Mobility   Outcome Measure  Help needed turning from your back to your side while in a flat bed without  using bedrails?: A Lot Help needed moving from lying on your back to sitting on the side of a flat bed without using bedrails?: Total Help needed moving to and from a bed to a chair (including a wheelchair)?: Total Help needed standing up from a chair using your arms (e.g., wheelchair or bedside chair)?: Total Help needed to walk in hospital room?: Total Help needed climbing 3-5 steps with a railing? : Total 6 Click Score: 7    End of Session Equipment Utilized During Treatment: Gait belt Activity Tolerance: Patient limited by pain Patient left: in bed;with call bell/phone within reach;with bed alarm set Nurse Communication: Mobility status;Precautions;Other (comment)(pt's pain status; LSO discussion) PT Visit Diagnosis: Other abnormalities of gait and mobility (R26.89);Muscle weakness (generalized) (M62.81);History of falling  (Z91.81);Difficulty in walking, not elsewhere classified (R26.2);Pain Pain - Right/Left: Right Pain - part of body: Hip(low back)     Time: 0272-5366 PT Time Calculation (min) (ACUTE ONLY): 36 min  Charges:  $Therapeutic Exercise: 8-22 mins                     Hendricks Limes, PT 05/26/19, 1:09 PM

## 2019-05-27 LAB — GLUCOSE, CAPILLARY
Glucose-Capillary: 136 mg/dL — ABNORMAL HIGH (ref 70–99)
Glucose-Capillary: 147 mg/dL — ABNORMAL HIGH (ref 70–99)
Glucose-Capillary: 64 mg/dL — ABNORMAL LOW (ref 70–99)
Glucose-Capillary: 97 mg/dL (ref 70–99)

## 2019-05-27 NOTE — Progress Notes (Signed)
Progress Note   Date: 05/27/2019  Subjective:  Alicia Andrade is seen this week and appears brighter. She has had increased dietary intake. She appears to have better pain control but does comment on some difficulty with right leg more than left but she states she is moving them better   Vital Signs: Temp:  [97.7 F (36.5 C)-98.1 F (36.7 C)] 97.7 F (36.5 C) (05/17 2355) Pulse Rate:  [99-101] 99 (05/17 2355) Resp:  [16] 16 (05/17 2355) BP: (126-141)/(73-85) 141/85 (05/17 2355) SpO2:  [94 %-98 %] 94 % (05/17 2355) Temp (24hrs), Avg:97.9 F (36.6 C), Min:97.7 F (36.5 C), Max:98.1 F (36.7 C)  Weight: 57.5 kg @IOLAST2SHIFTS @  Problem List Patient Active Problem List   Diagnosis Date Noted  . Urinary retention   . Altered mental status 05/20/2019  . Cognitive decline 05/20/2019  . DM hyperosmolarity type II, uncontrolled ([place]) 05/20/2019  . Hypothyroidism 05/20/2019  . Bipolar disorder ([place]) 05/20/2019  . 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 Cerebral contusion ([place]) 05/20/2019  . Debility 05/20/2019  . Frequent falls 05/20/2019  . Malnutrition of moderate degree 05/20/2019  . Stroke ([place]) 05/19/2019  . Pressure injury of skin 05/19/2019    Medications: Scheduled Meds: . acidophilus  1 capsule Oral Daily  . ascorbic acid  500 mg Oral BID  . aspirin EC  325 mg Oral Daily  . Chlorhexidine Gluconate Cloth  6 each Topical Daily  . collagenase   Topical Daily  . enoxaparin (LOVENOX) injection  40 mg Subcutaneous Q24H  . gabapentin  300 mg Oral TID  . insulin aspart  0-15 Units Subcutaneous TID WC  . insulin glargine  15 Units Subcutaneous Daily  . lamoTRIgine  100 mg Oral BID  . levothyroxine  125 mcg Oral Daily  . multivitamin with minerals  1 tablet Oral Daily  . polyethylene glycol  17 g Oral Daily  . Ensure Max Protein  11 oz Oral BID BM  . simvastatin  10 mg Oral q1800  . sodium chloride flush  10-40 mL Intracatheter  Q12H  . venlafaxine XR  150 mg Oral Q breakfast  . Vitamin D (Ergocalciferol)  50,000 Units Oral Q7 days   Continuous Infusions: . cefTRIAXone (ROCEPHIN)  IV 2 g (05/26/19 0843)   PRN Meds:.acetaminophen **OR** acetaminophen (TYLENOL) oral liquid 160 mg/5 mL **OR** acetaminophen, melatonin, oxyCODONE, sodium chloride flush  Labs:  Lab Results  Component Value Date   WBC 8.6 05/26/2019   WBC 10.7 (H) 05/24/2019   HCT 28.5 (L) 05/26/2019   HCT 29.2 (L) 05/24/2019   PLT 336 05/26/2019   PLT 350 05/24/2019    Lab Results  Component Value Date   INR 1.1 05/19/2019   APTT 28 05/19/2019   Lab Results  Component Value Date   NA 138 05/26/2019   NA 136 05/24/2019   K 3.6 05/26/2019   K 3.7 05/24/2019   BUN 15 05/26/2019   BUN 13 05/24/2019    Lab Results  Component Value Date   MG 1.9 05/21/2019   Exam General appearance: Alert, cooperative, in no acute distress Ext: Wasting noted in all extremities  Neurologic exam:  Mental status: alertness: alert but slightly blunted,  affect: normal Speech: fluent and clear Motor:strength in bilateral iliopsoas is 3/5 on right, 4-/5 on left but pain elicited in back.4/5 in knee extension, 5 out of 5 in dorsi and plantar flexion. There is some increase in spasm in right leg  Sensory: intact to light touch in bilateral lower extremities throughout with exception of some decrease over the dorsum of the right foot Reflexes: 1+ at bilateral patella Gait: Not tested given deconditioned state  Plan: Alicia Andrade appears to be stable from a neurological perspective and she needs to continue antibiotics. I dont see any need for surgery at this time as she is being treated for this. Her urinary symptoms pre-date this admission by a few months and I am unsure of exact etiology of this but agree with continued urology care. We can see as outpatient to watch for healingof the L3/4 space   - Recommend LSO brace when upright   Lucy Chris, MD

## 2019-05-27 NOTE — TOC Progression Note (Addendum)
Transition of Care [facility]) - Progression Note    Patient Details  Name: Roseann Kees MRN: [id number] Date of Birth: 17-Nov-1960  [facility]) CM/SW Contact  Lycia Sachdeva, Lemar Livings, LCSW Phone Number: 05/27/2019, 8:05 AM  Clinical Narrative: Waiting insurance auth have ambulance transport prepared when receive and can transfer to [facility]. Await return call from facility.    11:44 am-Still awaiting insurance approval for pt to transfer to SNF  12:00 Just received auth for [facility] [id number]. Will get number for report and work on getting amb scheduled.  12:20 [facility] can not spare a truck today so have scheduled for 9:00 am tomorrow. Pt and family aware along with MD and staff. Ambulance scheduled for 9:00 am tomorrow.   Expected Discharge Plan: Skilled Nursing Facility Barriers to Discharge: Continued Medical Work up  Expected Discharge Plan and Services Expected Discharge Plan: Skilled Nursing Facility In-house Referral: Clinical Social Work     Living arrangements for the past 2 months: Single Family Home                                       Social Determinants of Health (SDOH) Interventions    Readmission Risk Interventions No flowsheet data found.

## 2019-05-27 NOTE — Progress Notes (Signed)
Nutrition Follow-up  DOCUMENTATION CODES:   Non-severe (moderate) malnutrition in context of social or environmental circumstances  INTERVENTION:  Continue Ensure Max Protein po BID, each supplement provides 150 kcal and 30 grams of protein.  Continue daily MVI.  Discussed importance of adequate intake of calories and protein. Encouraged adequate intake at meals.  NUTRITION DIAGNOSIS:   Moderate Malnutrition related to social / environmental circumstances(inadequate oral intake) as evidenced by mild fat depletion, mild muscle depletion, moderate muscle depletion.  Ongoing.  GOAL:   Patient will meet greater than or equal to 90% of their needs  Progressing.  MONITOR:   PO intake, Supplement acceptance, Labs, Weight trends, I & O's, Skin  REASON FOR ASSESSMENT:   Malnutrition Screening Tool, Consult Assessment of nutrition requirement/status, Wound healing, Poor PO, Hip fracture protocol  ASSESSMENT:   59 year old female with PMHx of DM, bipolar 1 disorder, major depressive disorder, hypothyroidism who presents from [facility] where she was undergoing rehab for fracture of her proximal right femur with AMS, vomiting, and falls. Patient found to have acute CVA of mid-anterior frontal lobe, cerebral contusion, lytic lesions in lumbar spine.  Met with patient at bedside. She reports overall her appetite has improved. According to chart her intake is variable but is now mainly 50-80% of meals. She reports eating about 65% of her meals yesterday. She is a little nervous about going to rehab tomorrow so she reports she did not eat well at lunch today. She is drinking 1 bottle of Ensure Max daily. Patient is amenable to drinking a second bottle of Ensure Max daily. Discussed importance of adequate nutrition to prevent further unintentional weight loss or loss of lean body mass. Also discussed importance of adequate nutrition for rehab.  Medications reviewed and include: acidophilus,  vitamin C 500 mg BID, Novolog 0-15 units TID, Lantus 15 units daily, MVI daily, Miralax, vitamin D 50000 units every 7 days, ceftriaxone.  Labs reviewed: CBG 106-147.  Diet Order:   Diet Order            Diet Carb Modified Fluid consistency: Thin; Room service appropriate? Yes  Diet effective now             EDUCATION NEEDS:   Education needs have been addressed  Skin:  Skin Assessment: Skin Integrity Issues: Skin Integrity Issues:: Stage II, Unstageable Stage II: left buttocks (1cm x 1.7cm); left buttocks (1.2cm x 3cm) Unstageable: sacrum (3.8cm x 4.2cm)  Last BM:  05/21/2019 per chart  Height:   Ht Readings from Last 1 Encounters:  05/19/19 5' 2"  (1.575 m)   Weight:   Wt Readings from Last 1 Encounters:  05/19/19 57.5 kg   BMI:  Body mass index is 23.19 kg/m.  Estimated Nutritional Needs:   Kcal:  1725-2000  Protein:  85-95 grams  Fluid:  1.7-2 L/day  Jacklynn Barnacle, MS, RD, LDN Pager number available on Amion

## 2019-05-27 NOTE — Progress Notes (Signed)
PROGRESS NOTE    Alicia Andrade  WYO:[id number] DOB: 24-May-1960 DOA: 05/19/2019 PCP: Galvin Proffer, MD      Brief Narrative:  Ms. Donalson is a 59 y.o. F with bipolar disorder, HTN, DM, hypothyroidism, and recent femur fracture if with fall, confusion, generalized weakness.  The ER, CT imaging of the back showed findings concerning for discitis versus osteomyelitis.  The patient was admitted to the hospital service.         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  These cultures grew E. coli, pansensitive, and so she has been started on ceftriaxone under the guidance of infectious disease.  Source appears to be primary seeding from her bacteremia several weeks ago at [facility] after UTI.  PICC line is in place.  She will need 6 weeks of IV antibiotics under the direction of infectious disease, at the moment we are pending skilled nursing placement.  -Continue ceftriaxone until July 04, 2019 -TLSO brace when out of bed   Urinary retention Patient noticed to have 500 cc urine retention in last several days.   She had serial I/O caths for 24 hours, and still has 600 to 700 cc postvoid residual.  Her MRI does not show spinal cord compression, and I do not believe that her leg weakness and urinary retention the result of cord compromise. -Maintain Foley for 1 week, voiding trial on or around 5/23  Fracture of greater tuberosity of right femur Discussed with Orthopedics.   -May be weight bearing on right leg -AVOID active abduction of right leg -Arrange follow up with Orthopedics in [place] area where she will go to rehab in 2-4 weeks with repeat x-ray  Hypothyroidism -Continue levothyroxine  Bipolar disorder -Continue lamotrigine, venlafaxine   Hypertension Blood pressure normal mostly -Continue losartan, HCTZ  Diabetes with peripheral neuropathy Glucose  controlled -Continue sliding scale corrections -Continue Lantus -Continue gabapentin, baby aspirin, and simvastatin  Moderate protein calorie malnutrition  Pressure injury, sacrum, unstageable, POA Pressure injury buttocks, left, stage II, present on admission  Anemia of chronic disease Stable, no clinical bleeding  Vitamin D deficiency Patient noticed to have recent fragility fracture, osteopenia on x-ray, and markedly low vitamin D level.  Given 50,000 units of vitamin D -Needs endocrinology referral after discharge  Cerebral contusion Noted incidentally on CT.  At admission.  Stroke ruled out with MRI.      Disposition: Status is: Inpatient  The patient came in with discitis and psoas abscess.  She will require 6 weeks of IV antibiotics.   SNF placement pending, but currently no safe discharge plan available til tomorrow.  Dispo: The patient is from: Home              Anticipated d/c is to: SNF              Anticipated d/c date is: 1 day              Patient currently is medically stable to d/c.   The patient has been diagnosed with a discitis and psoas abscess by E. coli.  She will require IV antibiotics until June 25.  She is still unable to walk, will need extensive rehabilitation in order to regain her prior level of function.  Skilled nursing placement has been found in the [place] area, she will have ambulance transport tomorrow at 9am.  MDM: The below labs and imaging reports reviewed and summarized above.  Medication management as above.     DVT prophylaxis: SCD Code Status: Full code Family Communication: Sister at the bedside    Consultants:   Infectious disease  IR  Neurosurgery  Procedures:   5/10 CT head and cspine  5/10 CT abdomen and pelvis -- L3-4 endplate destruction, pneumobilia  5/10 MR brain - No acute or subacute infarction. The left frontal abnormality shown by CT is most consistent with a small hemorrhagic  contusion.  5/10 MRI cervical thoracic and lumbar spine -- L3-4 discitis with psoas abscess  5/12 CT-guided disc space aspiration -- successful aspiration of psoas abscess  Antimicrobials:    Ceftriaxone 5/14 >>  Culture data:   5/11 blood culture -- no growth  5/12 disc aspirate -- E coli, pansensitive          Subjective: No fever.  Back pain is well controlled.  Delirium is resolved.  No chest pain, respiratory distress.  No urge to urinate.  Leg weakness is stable.       Objective: Vitals:   05/26/19 0742 05/26/19 1707 05/26/19 2355 05/27/19 0745  BP: 117/75 126/73 (!) 141/85 121/69  Pulse: (!) 104 (!) 101 99 96  Resp: Temp: 98.3 F (36.8 C) 98.1 F (36.7 C) 97.7 F (36.5 C) 98.3 F (36.8 C)  TempSrc:   Oral Oral  SpO2: 99% 98% 94% 99%  Weight:      Height:        Intake/Output Summary (Last 24 hours) at 05/27/2019 1614 Last data filed at 05/27/2019 1539 Gross per 24 hour  Intake 240 ml  Output 3200 ml  Net -2960 ml   Filed Weights   05/19/19 1138 05/19/19 1718  Weight: 56.7 kg 57.5 kg    Examination: General appearance: Chronically ill-appearing adult female, lying in bed, no obvious distress.  Interactive.     HEENT: Anicteric, conjunctival pink, lids and lashes normal.  No nasal deformity, discharge, or epistaxis.  Oropharynx moist, no oral lesions, hearing normal. Skin: No suspicious rashes or lesions. Cardiac: RRR, no murmurs, JVP normal, no lower extremity edema Respiratory: Respiratory rate normal, lungs clear without rales or wheezes. Abdomen: Abdomen soft without tenderness palpation or guarding. MSK: Severe diffuse loss of subcutaneous muscle mass and fat.  There is bowleggedness, and deformity of the knees bilaterally. Neuro: Generalized weakness, worse in the legs.  Extraocular movements intact, speech fluent. Psych: Affect normal, judgment and insight appear normal.  Attention normal.           Data Reviewed: I  have personally reviewed following labs and imaging studies:  CBC: Recent Labs  Lab 05/21/19 0400 05/22/19 0501 05/23/19 0452 05/24/19 0526 05/26/19 0649  WBC 11.5* 11.4* 11.7* 10.7* 8.6  NEUTROABS 9.6*  --   --   --   --   HGB 10.2* 9.9* 10.6* 9.8* 9.7*  HCT 30.9* 29.1* 30.8* 29.2* 28.5*  MCV 88.8 87.9 87.3 88.5 87.2  PLT 382 351 367 350 336   Basic Metabolic Panel: Recent Labs  Lab 05/21/19 0400 05/22/19 0501 05/23/19 0452 05/24/19 0526 05/26/19 0649  NA 136 138 137 136 138  K 3.2* 4.1 3.9 3.7 3.6  CL 96* 100 98 98 100  CO2 GLUCOSE 168* 160* 187* 170* 193*  BUN CREATININE <0.30* 0.38* 0.38* 0.36* 0.36*  CALCIUM 8.5* 8.4* 8.7* 8.6* 8.9  MG  1.9  --   --   --   --   PHOS  --   --  3.3  --   --    GFR: Estimated Creatinine Clearance: 59.9 mL/min (A) (by C-G formula based on SCr of 0.36 mg/dL (L)). Liver Function Tests: Recent Labs  Lab 05/23/19 1705  AST 14*  ALT 10  ALKPHOS 138*  BILITOT 0.6  PROT 6.9  ALBUMIN 2.4*   No results for input(s): LIPASE, AMYLASE in the last 168 hours. No results for input(s): AMMONIA in the last 168 hours. Coagulation Profile: No results for input(s): INR, PROTIME in the last 168 hours. Cardiac Enzymes: No results for input(s): CKTOTAL, CKMB, CKMBINDEX, TROPONINI in the last 168 hours. BNP (last 3 results) No results for input(s): PROBNP in the last 8760 hours. HbA1C: No results for input(s): HGBA1C in the last 72 hours. CBG: Recent Labs  Lab 05/26/19 1154 05/26/19 1707 05/26/19 2139 05/27/19 0743 05/27/19 1152  GLUCAP 176* 106* 142* 136* 147*   Lipid Profile: No results for input(s): CHOL, HDL, LDLCALC, TRIG, CHOLHDL, LDLDIRECT in the last 72 hours. Thyroid Function Tests: No results for input(s): TSH, T4TOTAL, FREET4, T3FREE, THYROIDAB in the last 72 hours. Anemia Panel: No results for input(s): VITAMINB12, FOLATE, FERRITIN, TIBC, IRON, RETICCTPCT in the last 72 hours. Urine  analysis:    Component Value Date/Time   COLORURINE YELLOW (A) 05/20/2019 1500   APPEARANCEUR TURBID (A) 05/20/2019 1500   LABSPEC 1.004 (L) 05/20/2019 1500   PHURINE 5.0 05/20/2019 1500   GLUCOSEU NEGATIVE 05/20/2019 1500   HGBUR MODERATE (A) 05/20/2019 1500   BILIRUBINUR NEGATIVE 05/20/2019 1500   KETONESUR NEGATIVE 05/20/2019 1500   PROTEINUR NEGATIVE 05/20/2019 1500   NITRITE NEGATIVE 05/20/2019 1500   LEUKOCYTESUR LARGE (A) 05/20/2019 1500   Sepsis Labs: @LABRCNTIP (procalcitonin:4,lacticacidven:4)  ) Recent Results (from the past 240 hour(s))  SARS Coronavirus 2 by RT PCR (hospital order, performed in [facility] hospital lab) Nasopharyngeal Nasopharyngeal Swab     Status: None   Collection Time: 05/19/19  1:47 PM   Specimen: Nasopharyngeal Swab  Result Value Ref Range Status   SARS Coronavirus 2 NEGATIVE NEGATIVE Final    Comment: (NOTE) SARS-CoV-2 target nucleic acids are NOT DETECTED. The SARS-CoV-2 RNA is generally detectable in upper and lower respiratory specimens during the acute phase of infection. The lowest concentration of SARS-CoV-2 viral copies this assay can detect is 250 copies / mL. A negative result does not preclude SARS-CoV-2 infection and should not be used as the sole basis for treatment or other patient management decisions.  A negative result may occur with improper specimen collection / handling, submission of specimen other than nasopharyngeal swab, presence of viral mutation(s) within the areas targeted by this assay, and inadequate number of viral copies (<250 copies / mL). A negative result must be combined with clinical observations, patient history, and epidemiological information. Fact Sheet for Patients:   07/19/19 Fact Sheet for Healthcare Providers: BoilerBrush.com.cy This test is not yet approved or cleared  by the https://pope.com/ FDA and has been authorized for detection and/or  diagnosis of SARS-CoV-2 by FDA under an Emergency Use Authorization (EUA).  This EUA will remain in effect (meaning this test can be used) for the duration of the COVID-19 declaration under Section 564(b)(1) of the Act, 21 U.S.C. section 360bbb-3(b)(1), unless the authorization is terminated or revoked sooner. Performed at [facility], [address] [place], [place]   CULTURE, BLOOD (ROUTINE X 2) w Reflex to  ID Panel     Status: None   Collection Time: 05/20/19  9:33 AM   Specimen: BLOOD  Result Value Ref Range Status   Specimen Description BLOOD RIGHT ANTECUBITAL  Final   Special Requests   Final    BOTTLES DRAWN AEROBIC AND ANAEROBIC Blood Culture adequate volume   Culture   Final    NO GROWTH 5 DAYS Performed at [facility], [address] [place], [place] [address]    Report Status 05/25/2019 FINAL  Final  CULTURE, BLOOD (ROUTINE X 2) w Reflex to ID Panel     Status: None   Collection Time: 05/20/19  9:43 AM   Specimen: BLOOD  Result Value Ref Range Status   Specimen Description BLOOD RIGHT WRIST  Final   Special Requests   Final    BOTTLES DRAWN AEROBIC AND ANAEROBIC Blood Culture adequate volume   Culture   Final    NO GROWTH 5 DAYS Performed at [facility], [address] [place], [place] [address]    Report Status 05/25/2019 FINAL  Final  MRSA PCR Screening     Status: None   Collection Time: 05/20/19  8:31 PM   Specimen: Nasopharyngeal  Result Value Ref Range Status   MRSA by PCR NEGATIVE NEGATIVE Final    Comment:        The GeneXpert MRSA Assay (FDA approved for NASAL specimens only), is one component of a comprehensive MRSA colonization surveillance program. It is not intended to diagnose MRSA infection nor to guide or monitor treatment for MRSA infections. Performed at [facility], [address] [place], [place] [address]   Aerobic/Anaerobic Culture (surgical/deep wound)     Status: None    Collection Time: 05/21/19  9:58 AM   Specimen: Abscess  Result Value Ref Range Status   Specimen Description   Final    ABSCESS Performed at [facility], [address] [place], [place] [address]    Special Requests   Final    NONE Performed at [facility], [address] [place], [place] [address]    Gram Stain NO WBC SEEN RARE GRAM POSITIVE COCCI   Final   Culture   Final    FEW ESCHERICHIA COLI NO ANAEROBES ISOLATED Performed at [facility], [address], [place], [place] [address]    Report Status 05/26/2019 FINAL  Final   Organism ID, Bacteria ESCHERICHIA COLI  Final      Susceptibility   Escherichia coli - MIC*    AMPICILLIN <=2 SENSITIVE Sensitive     CEFAZOLIN <=4 SENSITIVE Sensitive     CEFEPIME <=1 SENSITIVE Sensitive     CEFTAZIDIME <=1 SENSITIVE Sensitive     CEFTRIAXONE <=1 SENSITIVE Sensitive     CIPROFLOXACIN <=0.25 SENSITIVE Sensitive     GENTAMICIN <=1 SENSITIVE Sensitive     IMIPENEM <=0.25 SENSITIVE Sensitive     TRIMETH/SULFA <=20 SENSITIVE Sensitive     AMPICILLIN/SULBACTAM <=2 SENSITIVE Sensitive     PIP/TAZO <=4 SENSITIVE Sensitive     * FEW ESCHERICHIA COLI  Aerobic/Anaerobic Culture (surgical/deep wound)     Status: None   Collection Time: 05/21/19 10:01 AM   Specimen: Abscess  Result Value Ref Range Status   Specimen Description   Final    ABSCESS Performed at [facility], [address], [place], [place] [address]    Special Requests   Final    NONE Performed at [facility], [address] [place],  [place] [address]    Gram Stain   Final    FEW WBC PRESENT,BOTH PMN AND MONONUCLEAR NO ORGANISMS SEEN    Culture   Final    RARE ESCHERICHIA COLI NO ANAEROBES ISOLATED Performed at [facility], [address], [place], [place] [address]    Report Status 05/26/2019 FINAL  Final   Organism ID, Bacteria ESCHERICHIA COLI  Final      Susceptibility   Escherichia coli - MIC*     AMPICILLIN <=2 SENSITIVE Sensitive     CEFAZOLIN <=4 SENSITIVE Sensitive     CEFEPIME <=1 SENSITIVE Sensitive     CEFTAZIDIME <=1 SENSITIVE Sensitive     CEFTRIAXONE <=1 SENSITIVE Sensitive     CIPROFLOXACIN <=0.25 SENSITIVE Sensitive     GENTAMICIN <=1 SENSITIVE Sensitive     IMIPENEM <=0.25 SENSITIVE Sensitive     TRIMETH/SULFA <=20 SENSITIVE Sensitive     AMPICILLIN/SULBACTAM <=2 SENSITIVE Sensitive     PIP/TAZO <=4 SENSITIVE Sensitive     * RARE ESCHERICHIA COLI  SARS CORONAVIRUS 2 (TAT 6-24 HRS) Nasopharyngeal Nasopharyngeal Swab     Status: None   Collection Time: 05/26/19  2:58 PM   Specimen: Nasopharyngeal Swab  Result Value Ref Range Status   SARS Coronavirus 2 NEGATIVE NEGATIVE Final    Comment: (NOTE) SARS-CoV-2 target nucleic acids are NOT DETECTED. The SARS-CoV-2 RNA is generally detectable in upper and lower respiratory specimens during the acute phase of infection. Negative results do not preclude SARS-CoV-2 infection, do not rule out co-infections with other pathogens, and should not be used as the sole basis for treatment or other patient management decisions. Negative results must be combined with clinical observations, patient history, and epidemiological information. The expected result is Negative. Fact Sheet for Patients: SugarRoll.be Fact Sheet for Healthcare Providers: https://www.woods-mathews.com/ This test is not yet approved or cleared by the [place] FDA and  has been authorized for detection and/or diagnosis of SARS-CoV-2 by FDA under an Emergency Use Authorization (EUA). This EUA will remain  in effect (meaning this test can be used) for the duration of the COVID-19 declaration under Section 56 4(b)(1) of the Act, 21 U.S.C. section 360bbb-3(b)(1), unless the authorization is terminated or revoked sooner. Performed at [facility], [place] [address], [place], [place] [address]           Radiology Studies: No results found.      Scheduled Meds: . acidophilus  1 capsule Oral Daily  . ascorbic acid  500 mg Oral BID  . aspirin EC  325 mg Oral Daily  . Chlorhexidine Gluconate Cloth  6 each Topical Daily  . collagenase   Topical Daily  . enoxaparin (LOVENOX) injection  40 mg Subcutaneous Q24H  . gabapentin  300 mg Oral TID  . insulin aspart  0-15 Units Subcutaneous TID WC  . insulin glargine  15 Units Subcutaneous Daily  . lamoTRIgine  100 mg Oral BID  . levothyroxine  125 mcg Oral Daily  . multivitamin with minerals  1 tablet Oral Daily  . polyethylene glycol  17 g Oral Daily  . Ensure Max Protein  11 oz Oral BID BM  . simvastatin  10 mg Oral q1800  . sodium chloride flush  10-40 mL Intracatheter Q12H  . venlafaxine XR  150 mg Oral Q breakfast  . Vitamin D (Ergocalciferol)  50,000 Units Oral Q7 days   Continuous Infusions: . cefTRIAXone (ROCEPHIN)  IV 2 g (05/27/19 1033)     LOS: 8 days  Time spent: 25 minutes    Alberteen Sam, MD [facility] 05/27/2019, 4:14 PM     Please page though AMION or Epic secure chat:  For [employer], Higher education careers adviser

## 2019-05-28 DIAGNOSIS — E1165 Type 2 diabetes mellitus with hyperglycemia: Secondary | ICD-10-CM

## 2019-05-28 DIAGNOSIS — E876 Hypokalemia: Secondary | ICD-10-CM

## 2019-05-28 DIAGNOSIS — S06310D Contusion and laceration of right cerebrum without loss of consciousness, subsequent encounter: Secondary | ICD-10-CM

## 2019-05-28 DIAGNOSIS — L0291 Cutaneous abscess, unspecified: Secondary | ICD-10-CM

## 2019-05-28 DIAGNOSIS — K6812 Psoas muscle abscess: Secondary | ICD-10-CM

## 2019-05-28 DIAGNOSIS — M4646 Discitis, unspecified, lumbar region: Secondary | ICD-10-CM

## 2019-05-28 DIAGNOSIS — E039 Hypothyroidism, unspecified: Secondary | ICD-10-CM

## 2019-05-28 DIAGNOSIS — R339 Retention of urine, unspecified: Secondary | ICD-10-CM

## 2019-05-28 DIAGNOSIS — E1100 Type 2 diabetes mellitus with hyperosmolarity without nonketotic hyperglycemic-hyperosmolar coma (NKHHC): Secondary | ICD-10-CM

## 2019-05-28 DIAGNOSIS — R410 Disorientation, unspecified: Secondary | ICD-10-CM

## 2019-05-28 DIAGNOSIS — F3113 Bipolar disorder, current episode manic without psychotic features, severe: Secondary | ICD-10-CM

## 2019-05-28 DIAGNOSIS — I63012 Cerebral infarction due to thrombosis of left vertebral artery: Secondary | ICD-10-CM

## 2019-05-28 DIAGNOSIS — R296 Repeated falls: Secondary | ICD-10-CM

## 2019-05-28 DIAGNOSIS — R5381 Other malaise: Secondary | ICD-10-CM

## 2019-05-28 DIAGNOSIS — L89152 Pressure ulcer of sacral region, stage 2: Secondary | ICD-10-CM

## 2019-05-28 LAB — GLUCOSE, CAPILLARY: Glucose-Capillary: 139 mg/dL — ABNORMAL HIGH (ref 70–99)

## 2019-05-28 MED ORDER — ASPIRIN 325 MG PO TBEC: 325.0000 mg | DELAYED_RELEASE_TABLET | Freq: Every day | ORAL | 0 refills | Status: DC

## 2019-05-28 MED ORDER — BISACODYL 10 MG RE SUPP
10.0000 mg | Freq: Once | RECTAL | Status: AC
  Administered 2019-05-28: 10 mg via RECTAL
  Filled 2019-05-28: qty 1

## 2019-05-28 MED ORDER — ENSURE MAX PROTEIN PO LIQD: 11.0000 [oz_av] | Freq: Two times a day (BID) | ORAL | Status: AC

## 2019-05-28 MED ORDER — CEFTRIAXONE IV (FOR PTA / DISCHARGE USE ONLY): 2.0000 g | INTRAVENOUS | 0 refills | Status: AC

## 2019-05-28 MED ORDER — COLLAGENASE 250 UNIT/GM EX OINT: TOPICAL_OINTMENT | Freq: Every day | CUTANEOUS | 0 refills | Status: DC

## 2019-05-28 MED ORDER — INSULIN GLARGINE 100 UNIT/ML ~~LOC~~ SOLN: 15.0000 [IU] | Freq: Every day | SUBCUTANEOUS | 11 refills | Status: AC

## 2019-05-28 MED ORDER — OXYCODONE HCL 5 MG PO TABS: 2.5000 mg | ORAL_TABLET | ORAL | 0 refills | Status: AC | PRN

## 2019-05-28 MED ORDER — VITAMIN D (ERGOCALCIFEROL) 1.25 MG (50000 UNIT) PO CAPS: 50000.0000 [IU] | ORAL_CAPSULE | ORAL | Status: AC

## 2019-05-28 MED ORDER — POLYETHYLENE GLYCOL 3350 17 G PO PACK: 17.0000 g | PACK | Freq: Every day | ORAL | 0 refills | Status: AC

## 2019-05-28 MED ORDER — MELATONIN 5 MG PO TABS: 5.0000 mg | ORAL_TABLET | Freq: Every evening | ORAL | 0 refills | Status: DC | PRN

## 2019-05-28 MED ORDER — SIMVASTATIN 10 MG PO TABS: 10.0000 mg | ORAL_TABLET | Freq: Every day | ORAL | Status: AC

## 2019-05-28 MED ORDER — INSULIN ASPART 100 UNIT/ML ~~LOC~~ SOLN: 0.0000 [IU] | Freq: Three times a day (TID) | SUBCUTANEOUS | 11 refills | Status: AC

## 2019-05-28 MED ORDER — RISAQUAD PO CAPS: 1.0000 | ORAL_CAPSULE | Freq: Every day | ORAL | Status: DC

## 2019-05-28 NOTE — Discharge Summary (Signed)
Physician Discharge Summary  Alicia Andrade GMW:[id number] DOB: 1960/02/16 DOA: 05/19/2019  PCP: Bonnita Nasuti, MD  Admit date: 05/19/2019 Discharge date: 05/28/2019  Admitted From: Home Disposition:  SNF  Recommendations for Outpatient Follow-up:  1. Follow up with PCP in 1-2 weeks 2. F/U with neuro surgery in 3-4 weeks. 3. F/U with orthopedics. 4. Please obtain BMP/CBC in one week 5. Please follow up on the following pending results:None  Home Health:No Equipment/Devices: None Discharge Condition: Stable CODE STATUS:  Diet recommendation: Heart Healthy / Carb Modified / Regular / Dysphagia   Brief/Interim Summary: Alicia Andrade is a 59 y.o. F with bipolar disorder, HTN, DM, hypothyroidism,recent femur fracture and urosepsis presented with fall, altered mental status and generalized weakness.  On presentation she was found to have severe hypokalemia, hypomagnesemia, leukocytosis.CT of the head has demonstrated an acute/subacute infarct of the mid to anterior left frontal lobe. There is also mild generalized parenchymal atrophy. CT of the abdomen demonstrated an unhealed comminuted fracture of the greater tuperosity of the proximal right femur. There is also lytic destruction involving the vertebral endplates of Y4-0 disc space concerning for discitis and osteomyellitis. There is a small non-obstructive left renal calculus and a moderate sized fat-containing periumbilical hernia. There is a solitary gallstone noted. There is left hepatic pneumobilia due to prior sphincterotomy. Patient underwent  CT-guided aspiration of the psoas abscess. These cultures grew E. coli, pansensitive, and so she has been started on ceftriaxone under the guidance of infectious disease. Source appears to be primary seeding from her bacteremia several weeks ago at [facility] after UTI. PICC line is in place. She will need 6 weeks of IV antibiotics under the direction of infectious disease, she will  complete her antibiotics on July 04, 2019.  Urinary retention Patient noticed to have 500 cc urine retention in last several days.  She had serial I/O caths for 24 hours, and still has 600 to 700 cc postvoid residual. Her MRI does not show spinal cord compression, and I do not believe that her leg weakness and urinary retention the result of cord compromise.  Neurosurgery was consulted and they are recommending outpatient follow-up for healing of 3/4 discitis.  No further neurologic evaluation for urinary retention at this time and they are also recommending LSO brace when upright. -Maintain Foley for 1 week, voiding trial on or around 5/23, if fails then she will need an urologic evaluation.  Fracture of greater tuberosity of right femur Discussed with Orthopedics.   -May be weight bearing on right leg -AVOID active abduction of right leg -Arrange follow up with Orthopedics in [place] area where she will go to rehab in 2-4 weeks with repeat x-ray  Hypothyroidism -Continue levothyroxine  Bipolar disorder -Continue lamotrigine, venlafaxine   Hypertension Blood pressure normal mostly -Continue losartan, HCTZ  Vitamin D deficiency Patient noticed to have recent fragility fracture, osteopenia on x-ray, and markedly low vitamin D level. Given 50,000 units of vitamin D.  She will continue weekly dose of vitamin D and will need to follow-up with an endocrinologist.  Cerebral contusion Noted incidentally on CT. At admission. Stroke ruled out with MRI. She will continue with aspirin and Zocor.  Diabetes with peripheral neuropathy Glucose controlled -Continue sliding scale corrections -Continue Lantus -Continue gabapentin.  Moderate protein calorie malnutrition.  Patient with history of weight loss.  Currently weight stable.  She will continue with Ensure supplement and will need an outpatient evaluation to rule out any malignancy if continue to lose weight.  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   Complete by: As directed    [facility] to provide Cath Flo 30m   Complete by: As directed    Administer for PICC line occlusion and as ordered by physician for other access device issues.   Anaphylaxis Kit: Provided to treat any anaphylactic reaction to the medication being provided to the patient if First Dose or when requested by physician   Complete by: As directed    Epinephrine 146mml vial / amp: Administer 0.76m78m0.76ml7mubcutaneously once for moderate to severe anaphylaxis, nurse to call physician and pharmacy when reaction occurs and call 911 if needed for immediate care   Diphenhydramine 50mg57mIV vial: Administer 25-50mg 5mM PRN for first dose reaction, rash, itching, mild reaction, nurse to call physician and pharmacy when reaction occurs   Sodium Chloride 0.9% NS 500ml I376mdminister if needed for hypovolemic blood pressure drop or as ordered by physician after call to physician with anaphylactic reaction   Change dressing on IV access line weekly and PRN   Complete by: As directed    Diet - low sodium heart healthy   Complete by: As directed    Flush IV access with Sodium Chloride 0.9% and Heparin 10  units/ml or 100 units/ml   Complete by: As directed    Home infusion instructions - [facility]   Complete by: As directed    Instructions: Flush IV access with Sodium Chloride 0.9% and Heparin 10units/ml or 100units/ml   Change dressing on IV access line: Weekly and PRN   Instructions Cath Flo 2mg: Ad90mister for PICC Line occlusion and as ordered by physician for other access device   [facility] pharmacist to adjust dose for: Vancomycin, Aminoglycosides and other anti-infective therapies as requested by physician   Increase activity slowly   Complete by: As directed    Method of administration may be changed at the discretion of home infusion pharmacist based upon assessment of the patient and/or caregiver's ability to self-administer the medication ordered   Complete by: As directed      Allergies as of 05/28/2019   No Known Allergies     Medication List    STOP taking these medications   insulin glargine 100 unit/mL Sopn Commonly known as: LANTUS Replaced by: insulin glargine 100 UNIT/ML injection     TAKE these medications   acidophilus Caps capsule Take 1 capsule by mouth daily.   ascorbic acid 500 MG tablet Commonly known as: VITAMIN C Take 500 mg by mouth 2 (two) times daily.   aspirin 325 MG EC tablet Take 1 tablet (325 mg total) by mouth daily.   cefTRIAXone  IVPB Commonly known as: ROCEPHIN Inject 2 g into the vein daily. 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 First Dose: No Last Day of Therapy:  07/04/2019 [facility] Care Per Protocol: Labs weekly every Wednesday while on IV antibiotics: _X_ CBC with differential _X_ CMP _X_ ESR _X_ Please pull PIC at completion of IV antibiotics  Fax weekly labs to Dr.Ravishankar [phone]  Clinic Follow Up Appt:4 weeks- can be televisit  Call [phone] to make appt Method of administration: IV Push Method of administration may be changed at the discretion of  home infusion pharmacist based upon assessment of the patient and/or caregiver's ability to self-administer the medication ordered.   collagenase ointment Commonly known as: SANTYL Apply topically daily.   diclofenac Sodium 1 % Gel Commonly known as: VOLTAREN Apply 4 g topically 4 (four) times daily.   Ensure Max Protein Liqd Take 330 mLs (11 oz total) by mouth 2 (two) times daily between meals.   gabapentin 300 MG capsule Commonly known as: NEURONTIN Take 300 mg by mouth 3 (three) times daily.   insulin aspart 100 UNIT/ML injection Commonly known as: novoLOG Inject 0-15 Units into the skin 3 (three) times daily with meals. What changed:   how much to take  when to take this  additional instructions   insulin glargine 100 UNIT/ML injection Commonly known as: LANTUS Inject 0.15 mLs (15 Units total) into the skin daily. Replaces: insulin glargine 100 unit/mL Sopn   LamoTRIgine 200 MG Tb24 24 hour tablet Take 1 tablet by mouth daily.   levothyroxine 125 MCG tablet Commonly known as: SYNTHROID Take 125 mcg by mouth daily.   melatonin 5 MG Tabs Take 1 tablet (5 mg total) by mouth at bedtime as needed (sleep).   MULTIPLE VITAMINS-MINERALS PO Take by mouth.   olmesartan-hydrochlorothiazide 20-12.5 MG tablet Commonly known as: BENICAR HCT Take 1 tablet by mouth daily.   oxyCODONE 5 MG immediate release tablet Commonly known as: Oxy IR/ROXICODONE Take 0.5 tablets (2.5 mg total) by mouth every 4 (four) hours as needed for moderate pain.   polyethylene glycol 17 g packet Commonly known as: MIRALAX / GLYCOLAX Take 17 g by mouth daily.   simvastatin 10 MG tablet Commonly known as: ZOCOR Take 1 tablet (10 mg total) by mouth daily at 6 PM.   venlafaxine XR 150 MG 24 hr capsule Commonly known as: EFFEXOR-XR Take 150 mg by mouth in the morning and at bedtime.   Vitamin D (Ergocalciferol) 1.25 MG (50000 UNIT) Caps capsule Commonly known as: DRISDOL Take 1 capsule  (50,000 Units total) by mouth every 7 (seven) days. Start taking on: May 30, 2019            Discharge Care Instructions  (From admission, onward)         Start     Ordered   05/28/19 0000  Change dressing on IV access line weekly and PRN  (Home infusion instructions - [facility] )     05/28/19 0916          No Known Allergies  Consultations:  Infectious disease  IR  Neurosurgery  Procedures/Studies: Procedures:   5/10 CT head and cspine  5/10 CT abdomen and pelvis -- L3-4 endplate destruction, pneumobilia  5/10 MR brain - No acute or subacute infarction. The left frontal abnormality shown by CT is most consistent with a small hemorrhagic contusion.  5/10 MRI cervical thoracic and lumbar spine -- L3-4 discitis with psoas abscess  5/12 CT-guided disc space aspiration -- successful aspiration of psoas abscess  CT ABDOMEN PELVIS WO CONTRAST  Result Date: 05/19/2019 CLINICAL DATA:  Status post fall. EXAM: CT ABDOMEN AND PELVIS WITHOUT CONTRAST TECHNIQUE: Multidetector CT imaging of the abdomen and pelvis was performed following the standard protocol without IV contrast. COMPARISON:  April 21, 2019. FINDINGS: Lower chest: No acute abnormality. Hepatobiliary: Solitary gallstone is noted. No biliary dilatation is noted. However left hepatic pneumobilia is noted. Pancreas: Unremarkable. No pancreatic ductal dilatation or surrounding inflammatory changes. Spleen: Normal in size without focal abnormality. Adrenals/Urinary Tract: Adrenal glands appear normal. Small nonobstructive left renal calculus is noted. No hydronephrosis or renal obstruction is noted. Mild urinary bladder distention is noted. Stomach/Bowel: Stomach is within normal limits. Appendix appears normal. No evidence of bowel wall thickening, distention, or inflammatory changes. Vascular/Lymphatic: Aortic atherosclerosis. No enlarged abdominal or pelvic lymph nodes. Reproductive: Uterus and bilateral  adnexa are unremarkable. Other: Moderate size fat containing periumbilical hernia is noted in the pelvis. No ascites is noted. Musculoskeletal: Un healed comminuted fracture is seen involving the greater tuberosity of the proximal right femur. Multilevel degenerative disc disease is noted in the lumbar spine. There appears to be lytic destruction involving the adjacent articular surfaces of L3-4 disc space concerning for discitis and osteomyelitis. MRI is recommended for further evaluation. IMPRESSION: 1. Unhealed comminuted fracture is seen involving the greater tuberosity of the proximal right femur. 2. There appears to be lytic destruction involving the adjacent vertebral endplates of Q3-0 disc space concerning for discitis and osteomyelitis. MRI is recommended for further evaluation. 3. Solitary gallstone is noted. Left hepatic pneumobilia is noted which may be due to prior sphincterotomy. 4. Small nonobstructive left renal calculus. 5. Moderate size fat containing periumbilical hernia. Aortic Atherosclerosis (ICD10-I70.0). Electronically Signed   By: Marijo Conception M.D.   On: 05/19/2019 12:27   DG Knee 1-2 Views Right  Result Date: 05/23/2019 CLINICAL DATA:  Right knee and foot pain EXAM: RIGHT KNEE - 1-2 VIEW COMPARISON:  None. FINDINGS: No definite fracture or dislocation. There is advanced tricompartmental osteoarthritis with joint space loss and marginal osteophyte formation. Significant overlying soft tissue swelling seen around the medial aspect of the knee. A small knee joint effusion is seen. Dense vascular calcifications are noted. Diffuse osteopenia seen. IMPRESSION: No definite acute osseous abnormality. Advanced tricompartmental osteoarthritis. Soft tissue swelling over the medial aspect of the knee. Electronically Signed   By: Prudencio Pair M.D.   On: 05/23/2019 19:49   CT Head Wo Contrast  Result Date: 05/19/2019 CLINICAL DATA:  Head trauma, headache. Neck trauma, uncomplicated. Additional  history provided: Reported fall last night hitting head. EXAM: CT HEAD WITHOUT CONTRAST CT CERVICAL SPINE WITHOUT CONTRAST TECHNIQUE: Multidetector CT imaging of the head and cervical spine was performed following the standard protocol without intravenous contrast. Multiplanar CT image reconstructions of the cervical spine were also generated. COMPARISON:  Head CT 04/19/2019, CT cervical spine 07/03/2018. FINDINGS: CT HEAD FINDINGS Brain: There is a small acute/subacute appearing cortical infarct within the mid to anterior left frontal lobe. This was not present on prior head CT 04/19/2019 (series 2, image 19) (series 4, image 16). Scattered ill-defined hypoattenuation within the cerebral white matter is unchanged. Stable, mild generalized parenchymal atrophy. There is no acute intracranial hemorrhage. No extra-axial fluid collection. No evidence of intracranial mass. No midline shift. Vascular: No hyperdense vessel.  Atherosclerotic calcifications. Skull: Normal. Negative for fracture or focal lesion. Sinuses/Orbits: Visualized orbits show no acute finding. Minimal ethmoid sinus mucosal thickening. Trace fluid within the bilateral mastoid air cells. CT CERVICAL SPINE FINDINGS Alignment: Straightening of the  expected cervical lordosis. Mild C3-C4 and C4-C5 grade 1 anterolisthesis. Skull base and vertebrae: The basion-dental and atlanto-dental intervals are maintained.No evidence of acute fracture to the cervical spine. Soft tissues and spinal canal: No prevertebral fluid or swelling. No visible canal hematoma. Disc levels: Cervical spondylosis with multilevel disc space narrowing, posterior disc osteophytes, uncovertebral and facet hypertrophy. Disc space narrowing is severe at C5-C6, C6-C7, C7-T1, T1-T2 and T2-T3. 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 Multilevel bony spinal canal stenosis, greatest at C5-C6 and C6-C7 (at least moderate in severity at these levels). Bulky multilevel  ventrolateral osteophytes. Redemonstrated multilevel degenerative fusion. Upper chest: No consolidation within the imaged lung apices. No visible pneumothorax. Other: Atherosclerotic plaque within the proximal major branch vessels of the neck and bilateral carotid arteries. IMPRESSION: CT head: 1. Small acute/subacute appearing cortically based infarct within the mid to anterior left frontal lobe. This finding was not present on prior head CT 04/19/2019. 2. No acute intracranial hemorrhage 3. Stable nonspecific cerebral white matter disease, most commonly due to small vessel ischemia. 4. Stable mild generalized parenchymal atrophy. 5. Trace bilateral mastoid effusions. CT cervical spine: 1. No evidence of acute fracture to the cervical spine. 2. 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 3. Unchanged C3-C4 and C4-C5 grade 1 anterolisthesis. Electronically Signed   By: Kellie Simmering DO   On: 05/19/2019 12:36   CT Cervical Spine Wo Contrast  Result Date: 05/19/2019 CLINICAL DATA:  Head trauma, headache. Neck trauma, uncomplicated. Additional history provided: Reported fall last night hitting head. EXAM: CT HEAD WITHOUT CONTRAST CT CERVICAL SPINE WITHOUT CONTRAST TECHNIQUE: Multidetector CT imaging of the head and cervical spine was performed following the standard protocol without intravenous contrast. Multiplanar CT image reconstructions of the cervical spine were also generated. COMPARISON:  Head CT 04/19/2019, CT cervical spine 07/03/2018. FINDINGS: CT HEAD FINDINGS Brain: There is a small acute/subacute appearing cortical infarct within the mid to anterior left frontal lobe. This was not present on prior head CT 04/19/2019 (series 2, image 19) (series 4, image 16). Scattered ill-defined hypoattenuation within the cerebral white matter is unchanged. Stable, mild generalized parenchymal atrophy. There is no acute intracranial hemorrhage. No extra-axial fluid  collection. No evidence of intracranial mass. No midline shift. Vascular: No hyperdense vessel.  Atherosclerotic calcifications. Skull: Normal. Negative for fracture or focal lesion. Sinuses/Orbits: Visualized orbits show no acute finding. Minimal ethmoid sinus mucosal thickening. Trace fluid within the bilateral mastoid air cells. CT CERVICAL SPINE FINDINGS Alignment: Straightening of the expected cervical lordosis. Mild C3-C4 and C4-C5 grade 1 anterolisthesis. Skull base and vertebrae: The basion-dental and atlanto-dental intervals are maintained.No evidence of acute fracture to the cervical spine. Soft tissues and spinal canal: No prevertebral fluid or swelling. No visible canal hematoma. Disc levels: Cervical spondylosis with multilevel disc space narrowing, posterior disc osteophytes, uncovertebral and facet hypertrophy. Disc space narrowing is severe at C5-C6, C6-C7, C7-T1, T1-T2 and T2-T3. 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 Multilevel bony spinal canal stenosis, greatest at C5-C6 and C6-C7 (at least moderate in severity at these levels). Bulky multilevel ventrolateral osteophytes. Redemonstrated multilevel degenerative fusion. Upper chest: No consolidation within the imaged lung apices. No visible pneumothorax. Other: Atherosclerotic plaque within the proximal major branch vessels of the neck and bilateral carotid arteries. IMPRESSION: CT head: 1. Small acute/subacute appearing cortically based infarct within the mid to anterior left frontal lobe. This finding was not present on prior head CT 04/19/2019. 2. No acute  intracranial hemorrhage 3. Stable nonspecific cerebral white matter disease, most commonly due to small vessel ischemia. 4. Stable mild generalized parenchymal atrophy. 5. Trace bilateral mastoid effusions. CT cervical spine: 1. No evidence of acute fracture to the cervical spine. 2. 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 3. Unchanged C3-C4 and C4-C5 grade 1 anterolisthesis. Electronically Signed   By: Kellie Simmering DO   On: 05/19/2019 12:36   MR ANGIO NECK W WO CONTRAST  Result Date: 05/19/2019 CLINICAL DATA:  Fall with altered mental status. EXAM: MRA NECK WITHOUT AND WITH CONTRAST TECHNIQUE: Multiplanar and multiecho pulse sequences of the neck were obtained without and with intravenous contrast. Angiographic images of the neck were obtained using MRA technique without and with intravenous contrast. CONTRAST:  41m GADAVIST GADOBUTROL 1 MMOL/ML IV SOLN COMPARISON:  None. FINDINGS: Branching pattern is normal without origin stenosis. Both common carotid arteries widely patent to the bifurcation. Both carotid bifurcations widely patent. Cervical internal carotid arteries widely patent. Both vertebral artery origins widely patent. Both vertebral arteries are widely patent through the cervical region to the foramen magnum. Circle Willis vessels included on this scan. Low resolution evaluation shows patency of the major vessels without evidence of aneurysm or correctable proximal stenosis. IMPRESSION: Negative MR angiography of the neck vessels. No stenosis or dissection. Electronically Signed   By: MNelson ChimesM.D.   On: 05/19/2019 16:53   MR BRAIN WO CONTRAST  Result Date: 05/19/2019 CLINICAL DATA:  Fall with altered mental status. Left frontal infarction seen by CT. EXAM: MRI HEAD WITHOUT CONTRAST TECHNIQUE: Multiplanar, multiecho pulse sequences of the brain and surrounding structures were obtained without intravenous contrast. COMPARISON:  Head CT earlier same day. FINDINGS: Brain: Diffusion imaging does not show any acute or subacute infarction. The left frontal abnormality described by CT does not show restricted diffusion but shows an area of edema with small areas of internal microhemorrhage, most consistent with a small hemorrhagic contusion. The differential diagnosis does include late subacute  infarction, but I favor contusion. Elsewhere, the brainstem shows chronic small-vessel ischemic changes. No focal cerebellar insult. Mild chronic small-vessel ischemic change elsewhere affects the cerebral hemispheric white matter. No hydrocephalus. No extra-axial collection Vascular: Major vessels at the base of the brain show flow. Skull and upper cervical spine: Negative Sinuses/Orbits: Clear/normal Other: None IMPRESSION: No acute or subacute infarction. The left frontal abnormality shown by CT is most consistent with a small hemorrhagic contusion. Elsewhere, the brain shows chronic small-vessel ischemic changes. Electronically Signed   By: MNelson ChimesM.D.   On: 05/19/2019 16:51   MR THORACIC SPINE WO CONTRAST  Result Date: 05/19/2019 CLINICAL DATA:  Mental status changes.  Fall. EXAM: MRI THORACIC SPINE WITHOUT CONTRAST TECHNIQUE: Multiplanar, multisequence MR imaging of the thoracic spine was performed. No intravenous contrast was administered. COMPARISON:  None. FINDINGS: Alignment:  No traumatic malalignment. Vertebrae: No thoracic region fracture. Cord:  No cord compression or primary cord lesion. Paraspinal and other soft tissues: Negative Disc levels: Detail is limited because of motion. Degenerative spondylosis and facet arthritis in the thoracic region as often seen at this age. No evidence of compressive canal stenosis. Foraminal stenosis on the right at T4-5 due to osteophytic encroachment could possibly affect the right C5 nerve. The other foramina appear sufficiently patent. IMPRESSION: Motion degraded study. No evidence of acute thoracic region fracture or cord injury. Degenerative spondylosis and facet arthropathy, with limited evaluation due to motion. No apparent compressive canal stenosis. Right foraminal narrowing  at T4-5 because of osteophytic encroachment. Electronically Signed   By: Nelson Chimes M.D.   On: 05/19/2019 16:59   MR CERVICAL SPINE W WO CONTRAST  Result Date:  05/19/2019 CLINICAL DATA:  Fall with trauma to the head and neck. EXAM: MRI CERVICAL SPINE WITHOUT AND WITH CONTRAST TECHNIQUE: Multiplanar and multiecho pulse sequences of the cervical spine, to include the craniocervical junction and cervicothoracic junction, were obtained without and with intravenous contrast. CONTRAST:  8m GADAVIST GADOBUTROL 1 MMOL/ML IV SOLN COMPARISON:  CT earlier same day. FINDINGS: The study suffers from considerable motion degradation. Alignment: Straightening of the normal cervical lordosis. No traumatic malalignment. Vertebrae: No evidence of any fracture or bone marrow edema. Cord: No cord compression or primary cord lesion. Posterior Fossa, vertebral arteries, paraspinal tissues: See results of brain MRI. Disc levels: Limited detail because of motion. Degenerative spondylosis from C3-4 through C7-T1. Narrowing of the ventral subarachnoid space but no compression of the cord. Mild bilateral foraminal narrowing throughout that region. IMPRESSION: Motion degraded exam with limited detail. The study does not suggest any traumatic injury to the cervical spine or spinal cord. 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 No compressive canal stenosis. Bilateral foraminal narrowing through that region is likely chronic. Electronically Signed   By: MNelson ChimesM.D.   On: 05/19/2019 16:57   MR Lumbar Spine W Wo Contrast  Result Date: 05/19/2019 CLINICAL DATA:  [place]  Abnormal CT of the lumbar region. EXAM: MRI LUMBAR SPINE WITHOUT AND WITH CONTRAST TECHNIQUE: Multiplanar and multiecho pulse sequences of the lumbar spine were obtained without and with intravenous contrast. CONTRAST:  533mGADAVIST GADOBUTROL 1 MMOL/ML IV SOLN COMPARISON:  CT 05/19/2019 FINDINGS: Segmentation:  5 lumbar type vertebral bodies. Alignment:  Curvature convex to the left the apex at L2-3. Vertebrae: Chronic discogenic endplate changes at L2G2-5L4-5 and L5-S1. At L3-4, the disc space is filled with fluid intensity  material. There is edema and enhancement of the L3 and L4 vertebral bodies. There is regional epidural edema and enhancement. 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 There are fluid collections within the psoas muscles bilaterally, also most consistent with infection. Conus medullaris and cauda equina: Conus extends to the L1-2 level. Conus and cauda equina appear normal. Paraspinal and other soft tissues: Bilateral psoas muscle fluid collections most consistent with abscesses. Psoas hematomas could have a similar appearance in a different setting. Disc levels: L1-2: Disc bulge.  No compressive stenosis. L2-3: Endplate osteophytes and bulging of the disc. Mild facet hypertrophy. Moderate multifactorial stenosis. L3-4: As noted above, fluid intensity material filling the disc space. Edema of the L3 and L4 vertebral bodies. Epidural edema and enhancement. 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 L4-5: Spondylosis with endplate osteophytes and chronic disc protrusion. Moderate multifactorial stenosis that could cause neural compression. L5-S1: Endplate osteophytes and bulging of the disc. No central canal stenosis. Moderate left foraminal stenosis. 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 See above for full discussion. Electronically Signed   By: MaNelson Chimes.D.   On: 05/19/2019 17:04   CT ASPIRATION  Result Date: 05/21/2019 INDICATION: LUMBAR SPINE OSTEO MYELITIS/DISCITIS, SMALL PARASPINOUS ABSCESSES EXAM: CT ASPIRATION LEFT L3-4 PARASPINOUS ABSCESS CT ASPIRATION RIGHT L3-4 PARASPINOUS ABSCESS MEDICATIONS: The patient is currently admitted to the hospital and receiving intravenous antibiotics. The antibiotics were administered within an appropriate time frame prior to the initiation of  the  procedure. ANESTHESIA/SEDATION: Fentanyl 25 mcg IV; Versed 0.5 mg IV Moderate Sedation Time:  15 MINUTES The patient was continuously monitored during the procedure by the interventional radiology nurse under my direct supervision. COMPLICATIONS: None immediate. PROCEDURE: Informed written consent was obtained from the patient's power of attorney after a thorough discussion of the procedural risks, benefits and alternatives. All questions were addressed. Maximal Sterile Barrier Technique was utilized including caps, mask, sterile gowns, sterile gloves, sterile drape, hand hygiene and skin antiseptic. A timeout was performed prior to the initiation of the procedure. Previous imaging reviewed. Patient position prone. Noncontrast localization CT performed. 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 CT aspiration right paraspinous abscess: Under sterile conditions and local anesthesia, an 18 gauge 10 cm access needle was advanced into the right paraspinous abscess. Needle position confirmed with CT. Syringe aspiration yielded only scant bloody fluid. Sample sent for culture. Needle removed. CT aspiration left paraspinous abscess: Also under sterile conditions and local anesthesia, an 18 gauge 10 cm access needle was advanced from a posterior approach to the left paraspinous abscess. Needle position confirmed with CT. Syringe aspiration yielded scant exudative fluid sample sent for culture. Needle removed. Postprocedure imaging demonstrates no hemorrhage or hematoma. Patient tolerated the aspirations well. 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 Electronically Signed   By: Jerilynn Mages.  Shick M.D.   On: 05/21/2019 12:10   CT ASPIRATION  Result Date: 05/21/2019 INDICATION: LUMBAR SPINE OSTEO MYELITIS/DISCITIS, SMALL PARASPINOUS ABSCESSES EXAM: CT ASPIRATION LEFT L3-4  PARASPINOUS ABSCESS CT ASPIRATION RIGHT L3-4 PARASPINOUS ABSCESS MEDICATIONS: The patient is currently admitted to the hospital and receiving intravenous antibiotics. The antibiotics were administered within an appropriate time frame prior to the initiation of the procedure. ANESTHESIA/SEDATION: Fentanyl 25 mcg IV; Versed 0.5 mg IV Moderate Sedation Time:  15 MINUTES The patient was continuously monitored during the procedure by the interventional radiology nurse under my direct supervision. COMPLICATIONS: None immediate. PROCEDURE: Informed written consent was obtained from the patient's power of attorney after a thorough discussion of the procedural risks, benefits and alternatives. All questions were addressed. Maximal Sterile Barrier Technique was utilized including caps, mask, sterile gowns, sterile gloves, sterile drape, hand hygiene and skin antiseptic. A timeout was performed prior to the initiation of the procedure. Previous imaging reviewed. Patient position prone. Noncontrast localization CT performed. 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 CT aspiration right paraspinous abscess: Under sterile conditions and local anesthesia, an 18 gauge 10 cm access needle was advanced into the right paraspinous abscess. Needle position confirmed with CT. Syringe aspiration yielded only scant bloody fluid. Sample sent for culture. Needle removed. CT aspiration left paraspinous abscess: Also under sterile conditions and local anesthesia, an 18 gauge 10 cm access needle was advanced from a posterior approach to the left paraspinous abscess. Needle position confirmed with CT. Syringe aspiration yielded scant exudative fluid sample sent for culture. Needle removed. Postprocedure imaging demonstrates no hemorrhage or hematoma. Patient tolerated the aspirations well. IMPRESSION: Successful CT aspirations of the bilateral  paraspinous abscesses at the L3-4 spondylo discitis. Electronically Signed   By: Jerilynn Mages.  Shick M.D.   On: 05/21/2019 12:10   DG Chest Portable 1 View  Result Date: 05/19/2019 CLINICAL DATA:  Fall, altered mental status EXAM: PORTABLE CHEST 1 VIEW COMPARISON:  04/28/2019 FINDINGS: The heart size and  mediastinal contours are within normal limits. Interval resolution of previously seen right-sided airspace consolidation. No focal airspace consolidation, pleural effusion, or pneumothorax. Degenerative changes of the shoulders, right worse than left. IMPRESSION: No acute cardiopulmonary findings. Interval resolution of previously seen right-sided airspace consolidation. Electronically Signed   By: Davina Poke D.O.   On: 05/19/2019 12:04   DG Foot 2 Views Right  Result Date: 05/23/2019 CLINICAL DATA:  Right knee and foot pain EXAM: RIGHT FOOT - 2 VIEW COMPARISON:  None. FINDINGS: There is no evidence of fracture or dislocation. 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 Calcaneal enthesophytes are noted. Scattered vascular calcifications are seen. There is diffuse osteopenia. IMPRESSION: No acute osseous abnormality. Electronically Signed   By: Prudencio Pair M.D.   On: 05/23/2019 19:48   ECHOCARDIOGRAM COMPLETE  Result Date: 05/20/2019    ECHOCARDIOGRAM REPORT   Patient Name:   CHASITTY Parlett Date of Exam: 05/20/2019 Medical Rec #:  [id number]   Height:       62.0 in Accession #:    [id number]  Weight:       126.8 lb Date of Birth:  02-24-1960   BSA:          1.575 m Patient Age:    59 years    BP:           153/78 mmHg Patient Gender: F           HR:           101 bpm. Exam Location:  [facility] Procedure: 2D Echo, Color Doppler, Cardiac Doppler and Saline Contrast Bubble            Study Indications:     I163.9 Stroke  History:         Patient has no prior history of Echocardiogram examinations.                  Risk Factors:Diabetes.  Sonographer:     Charmayne Sheer RDCS (AE)  Referring Phys:  [id number] AVA Benny Lennert Diagnosing Phys: Bartholome Bill MD IMPRESSIONS  1. Left ventricular ejection fraction, by estimation, is 55 to 60%. The left ventricle has normal function. The left ventricle has no regional wall motion abnormalities. Left ventricular diastolic parameters are consistent with Grade I diastolic dysfunction (impaired relaxation).  2. Right ventricular systolic function is normal. The right ventricular size is normal.  3. Left atrial size was mildly dilated.  4. The mitral valve is grossly normal. Trivial mitral valve regurgitation. Mild mitral stenosis.  5. The aortic valve was not well visualized. Aortic valve regurgitation is trivial. Mild to moderate aortic valve sclerosis/calcification is present, without any evidence of aortic stenosis.  6. Aortic dilatation noted. There is borderline dilatation of the aortic root measuring 39 mm.  7. Agitated saline contrast bubble study was negative, with no evidence of any interatrial shunt. FINDINGS  Left Ventricle: Left ventricular ejection fraction, by estimation, is 55 to 60%. The left ventricle has normal function. The left ventricle has no regional wall motion abnormalities. The left ventricular internal cavity size was normal in size. There is  no left ventricular hypertrophy. Left ventricular diastolic parameters are consistent with Grade I diastolic dysfunction (impaired relaxation). Right Ventricle: The right ventricular size is normal. No increase in right ventricular wall thickness. Right ventricular systolic function is normal. Left Atrium: Left atrial size was mildly dilated. Right Atrium: Right atrial size was normal in size. Pericardium: There is no evidence of pericardial effusion.  Mitral Valve: The mitral valve is grossly normal. Trivial mitral valve regurgitation. Mild mitral valve stenosis. MV peak gradient, 24.2 mmHg. The mean mitral valve gradient is 12.0 mmHg. Tricuspid Valve: The tricuspid valve is not well visualized.  Tricuspid valve regurgitation is trivial. Aortic Valve: The aortic valve was not well visualized. Aortic valve regurgitation is trivial. Mild to moderate aortic valve sclerosis/calcification is present, without any evidence of aortic stenosis. Aortic valve mean gradient measures 5.0 mmHg. Aortic  valve peak gradient measures 8.8 mmHg. Aortic valve area, by VTI measures 4.01 cm. Pulmonic Valve: The pulmonic valve was not well visualized. Pulmonic valve regurgitation is trivial. Aorta: Aortic dilatation noted. There is borderline dilatation of the aortic root measuring 39 mm. IAS/Shunts: The atrial septum is grossly normal. Agitated saline contrast was given intravenously to evaluate for intracardiac shunting. Agitated saline contrast bubble study was negative, with no evidence of any interatrial shunt.  LEFT VENTRICLE PLAX 2D LVIDd:         4.49 cm  Diastology LVIDs:         3.24 cm  LV e' lateral:   4.35 cm/s LV PW:         0.91 cm  LV E/e' lateral: 35.2 LV IVS:        1.08 cm  LV e' medial:    4.35 cm/s LVOT diam:     2.30 cm  LV E/e' medial:  35.2 LV SV:         89 LV SV Index:   57 LVOT Area:     4.15 cm  RIGHT VENTRICLE RV Basal diam:  3.68 cm LEFT ATRIUM             Index       RIGHT ATRIUM           Index LA diam:        3.60 cm 2.29 cm/m  RA Area:     17.60 cm LA Vol (A2C):   87.0 ml 55.24 ml/m RA Volume:   46.70 ml  29.65 ml/m LA Vol (A4C):   68.8 ml 43.69 ml/m LA Biplane Vol: 79.4 ml 50.42 ml/m  AORTIC VALVE                    PULMONIC VALVE AV Area (Vmax):    3.34 cm     PV Vmax:       0.88 m/s AV Area (Vmean):   3.47 cm     PV Vmean:      60.800 cm/s AV Area (VTI):     4.01 cm     PV VTI:        0.143 m AV Vmax:           148.00 cm/s  PV Peak grad:  3.1 mmHg AV Vmean:          105.000 cm/s PV Mean grad:  2.0 mmHg AV VTI:            0.223 m AV Peak Grad:      8.8 mmHg AV Mean Grad:      5.0 mmHg LVOT Vmax:         119.00 cm/s LVOT Vmean:        87.600 cm/s LVOT VTI:          0.215 m LVOT/AV VTI  ratio: 0.96  AORTA Ao Root diam: 3.00 cm MITRAL VALVE MV Area (PHT): 7.37 cm     SHUNTS MV Peak grad:  24.2 mmHg  Systemic VTI:  0.22 m MV Mean grad:  12.0 mmHg    Systemic Diam: 2.30 cm MV Vmax:       2.46 m/s MV Vmean:      159.0 cm/s MV Decel Time: 103 msec MV E velocity: 153.00 cm/s MV A velocity: 227.00 cm/s MV E/A ratio:  0.67 Bartholome Bill MD Electronically signed by Bartholome Bill MD Signature Date/Time: 05/20/2019/1:21:38 PM    Final    [place] EKG SITE RITE  Result Date: 05/23/2019 If Site Rite image not attached, placement could not be confirmed due to current cardiac rhythm.   Subjective: Patient with no new complaints, ready to go to rehab. Facility. Pain well controlled, 3-4/10 with ambulation. Continue to have lower extremity weakness, right more then left. Had a BM this morning.  Discharge Exam: Vitals:   05/28/19 0008 05/28/19 0756  BP: 100/68 101/68  Pulse: 100 (!) 101  Resp: 18   Temp: 98.4 F (36.9 C) 98 F (36.7 C)  SpO2: 99% 99%   Vitals:   05/26/19 2355 05/27/19 0745 05/28/19 0008 05/28/19 0756  BP: (!) 141/85 121/69 100/68 101/68  Pulse: 99 96 100 (!) 101  Resp: 16 18 18    Temp: 97.7 F (36.5 C) 98.3 F (36.8 C) 98.4 F (36.9 C) 98 F (36.7 C)  TempSrc: Oral Oral Oral Oral  SpO2: 94% 99% 99% 99%  Weight:      Height:        General: Pt is alert, awake, not in acute distress Cardiovascular: RRR, S1/S2 +, no rubs, no gallops Respiratory: CTA bilaterally, no wheezing, no rhonchi Abdominal: Soft, NT, ND, bowel sounds + Extremities: no edema, no cyanosis   The results of significant diagnostics from this hospitalization (including imaging, microbiology, ancillary and laboratory) are listed below for reference.    Microbiology: Recent Results (from the past 240 hour(s))  SARS Coronavirus 2 by RT PCR (hospital order, performed in [facility] hospital lab) Nasopharyngeal Nasopharyngeal Swab     Status: None   Collection Time: 05/19/19  1:47 PM   Specimen:  Nasopharyngeal Swab  Result Value Ref Range Status   SARS Coronavirus 2 NEGATIVE NEGATIVE Final    Comment: (NOTE) SARS-CoV-2 target nucleic acids are NOT DETECTED. The SARS-CoV-2 RNA is generally detectable in upper and lower respiratory specimens during the acute phase of infection. The lowest concentration of SARS-CoV-2 viral copies this assay can detect is 250 copies / mL. A negative result does not preclude SARS-CoV-2 infection and should not be used as the sole basis for treatment or other patient management decisions.  A negative result may occur with improper specimen collection / handling, submission of specimen other than nasopharyngeal swab, presence of viral mutation(s) within the areas targeted by this assay, and inadequate number of viral copies (<250 copies / mL). A negative result must be combined with clinical observations, patient history, and epidemiological information. Fact Sheet for Patients:   StrictlyIdeas.no Fact Sheet for Healthcare Providers: BankingDealers.co.za This test is not yet approved or cleared  by the [place] FDA and has been authorized for detection and/or diagnosis of SARS-CoV-2 by FDA under an Emergency Use Authorization (EUA).  This EUA will remain in effect (meaning this test can be used) for the duration of the COVID-19 declaration under Section 564(b)(1) of the Act, 21 U.S.C. section 360bbb-3(b)(1), unless the authorization is terminated or revoked sooner. Performed at [facility], [address] [place], [place] [address]   CULTURE, BLOOD (ROUTINE X 2) w Reflex to  ID Panel     Status: None   Collection Time: 05/20/19  9:33 AM   Specimen: BLOOD  Result Value Ref Range Status   Specimen Description BLOOD RIGHT ANTECUBITAL  Final   Special Requests   Final    BOTTLES DRAWN AEROBIC AND ANAEROBIC Blood Culture adequate volume   Culture   Final    NO GROWTH 5 DAYS Performed at  [facility], [address] [place], [place] [address]    Report Status 05/25/2019 FINAL  Final  CULTURE, BLOOD (ROUTINE X 2) w Reflex to ID Panel     Status: None   Collection Time: 05/20/19  9:43 AM   Specimen: BLOOD  Result Value Ref Range Status   Specimen Description BLOOD RIGHT WRIST  Final   Special Requests   Final    BOTTLES DRAWN AEROBIC AND ANAEROBIC Blood Culture adequate volume   Culture   Final    NO GROWTH 5 DAYS Performed at [facility], [address] [place], [place] [address]    Report Status 05/25/2019 FINAL  Final  MRSA PCR Screening     Status: None   Collection Time: 05/20/19  8:31 PM   Specimen: Nasopharyngeal  Result Value Ref Range Status   MRSA by PCR NEGATIVE NEGATIVE Final    Comment:        The GeneXpert MRSA Assay (FDA approved for NASAL specimens only), is one component of a comprehensive MRSA colonization surveillance program. It is not intended to diagnose MRSA infection nor to guide or monitor treatment for MRSA infections. Performed at [facility], [address] [place], [place] [address]   Aerobic/Anaerobic Culture (surgical/deep wound)     Status: None   Collection Time: 05/21/19  9:58 AM   Specimen: Abscess  Result Value Ref Range Status   Specimen Description   Final    ABSCESS Performed at [facility], [address] [place], [place] [address]    Special Requests   Final    NONE Performed at [facility], [place], [place] [address]    Gram Stain NO WBC SEEN RARE GRAM POSITIVE COCCI   Final   Culture   Final    FEW ESCHERICHIA COLI NO ANAEROBES ISOLATED Performed at [facility], [place] [address], [place], [place] [address]    Report Status 05/26/2019 FINAL  Final   Organism ID, Bacteria ESCHERICHIA COLI  Final      Susceptibility   Escherichia coli - MIC*    AMPICILLIN <=2 SENSITIVE Sensitive     CEFAZOLIN <=4 SENSITIVE Sensitive      CEFEPIME <=1 SENSITIVE Sensitive     CEFTAZIDIME <=1 SENSITIVE Sensitive     CEFTRIAXONE <=1 SENSITIVE Sensitive     CIPROFLOXACIN <=0.25 SENSITIVE Sensitive     GENTAMICIN <=1 SENSITIVE Sensitive     IMIPENEM <=0.25 SENSITIVE Sensitive     TRIMETH/SULFA <=20 SENSITIVE Sensitive     AMPICILLIN/SULBACTAM <=2 SENSITIVE Sensitive     PIP/TAZO <=4 SENSITIVE Sensitive     * FEW ESCHERICHIA COLI  Aerobic/Anaerobic Culture (surgical/deep wound)     Status: None   Collection Time: 05/21/19 10:01 AM   Specimen: Abscess  Result Value Ref Range Status   Specimen Description   Final    ABSCESS Performed at [facility], [address] [place],  [address]    Special Requests   Final    NONE Performed at [facility], [place],  [place], [place] [address]    Gram Stain   Final    FEW WBC PRESENT,BOTH PMN AND MONONUCLEAR NO ORGANISMS SEEN    Culture   Final    RARE ESCHERICHIA COLI NO ANAEROBES ISOLATED Performed at [facility], [place] [address] [place], [place] [address]    Report Status 05/26/2019 FINAL  Final   Organism ID, Bacteria ESCHERICHIA COLI  Final      Susceptibility   Escherichia coli - MIC*    AMPICILLIN <=2 SENSITIVE Sensitive     CEFAZOLIN <=4 SENSITIVE Sensitive     CEFEPIME <=1 SENSITIVE Sensitive     CEFTAZIDIME <=1 SENSITIVE Sensitive     CEFTRIAXONE <=1 SENSITIVE Sensitive     CIPROFLOXACIN <=0.25 SENSITIVE Sensitive     GENTAMICIN <=1 SENSITIVE Sensitive     IMIPENEM <=0.25 SENSITIVE Sensitive     TRIMETH/SULFA <=20 SENSITIVE Sensitive     AMPICILLIN/SULBACTAM <=2 SENSITIVE Sensitive     PIP/TAZO <=4 SENSITIVE Sensitive     * RARE ESCHERICHIA COLI  SARS CORONAVIRUS 2 (TAT 6-24 HRS) Nasopharyngeal Nasopharyngeal Swab     Status: None   Collection Time: 05/26/19  2:58 PM   Specimen: Nasopharyngeal Swab  Result Value Ref Range Status   SARS Coronavirus 2 NEGATIVE NEGATIVE Final    Comment: (NOTE) SARS-CoV-2 target nucleic  acids are NOT DETECTED. The SARS-CoV-2 RNA is generally detectable in upper and lower respiratory specimens during the acute phase of infection. Negative results do not preclude SARS-CoV-2 infection, do not rule out co-infections with other pathogens, and should not be used as the sole basis for treatment or other patient management decisions. Negative results must be combined with clinical observations, patient history, and epidemiological information. The expected result is Negative. Fact Sheet for Patients: SugarRoll.be Fact Sheet for Healthcare Providers: https://www.woods-mathews.com/ This test is not yet approved or cleared by the [place] FDA and  has been authorized for detection and/or diagnosis of SARS-CoV-2 by FDA under an Emergency Use Authorization (EUA). This EUA will remain  in effect (meaning this test can be used) for the duration of the COVID-19 declaration under Section 56 4(b)(1) of the Act, 21 U.S.C. section 360bbb-3(b)(1), unless the authorization is terminated or revoked sooner. Performed at [facility], [place] [address] [place], [place] [address]      Labs: BNP (last 3 results) No results for input(s): BNP in the last 8760 hours. Basic Metabolic Panel: Recent Labs  Lab 05/22/19 0501 05/23/19 0452 05/24/19 0526 05/26/19 0649  NA 138 137 136 138  K 4.1 3.9 3.7 3.6  CL 100 98 98 100  CO2 30 31 29 29   GLUCOSE 160* 187* 170* 193*  BUN 10 10 13 15   CREATININE 0.38* 0.38* 0.36* 0.36*  CALCIUM 8.4* 8.7* 8.6* 8.9  PHOS  --  3.3  --   --    Liver Function Tests: Recent Labs  Lab 05/23/19 1705  AST 14*  ALT 10  ALKPHOS 138*  BILITOT 0.6  PROT 6.9  ALBUMIN 2.4*   No results for input(s): LIPASE, AMYLASE in the last 168 hours. No results for input(s): AMMONIA in the last 168 hours. CBC: Recent Labs  Lab 05/22/19 0501 05/23/19 0452 05/24/19 0526 05/26/19 0649  WBC 11.4* 11.7* 10.7* 8.6  HGB  9.9* 10.6* 9.8* 9.7*  HCT 29.1* 30.8* 29.2* 28.5*  MCV 87.9 87.3 88.5 87.2  PLT 351 367 350 336   Cardiac Enzymes: No results for input(s): CKTOTAL, CKMB, CKMBINDEX, TROPONINI in the  last 168 hours. BNP: Invalid input(s): POCBNP CBG: Recent Labs  Lab 05/27/19 0743 05/27/19 1152 05/27/19 1658 05/27/19 2116 05/28/19 0755  GLUCAP 136* 147* 64* 97 139*   D-Dimer No results for input(s): DDIMER in the last 72 hours. Hgb A1c No results for input(s): HGBA1C in the last 72 hours. Lipid Profile No results for input(s): CHOL, HDL, LDLCALC, TRIG, CHOLHDL, LDLDIRECT in the last 72 hours. Thyroid function studies No results for input(s): TSH, T4TOTAL, T3FREE, THYROIDAB in the last 72 hours.  Invalid input(s): FREET3 Anemia work up No results for input(s): VITAMINB12, FOLATE, FERRITIN, TIBC, IRON, RETICCTPCT in the last 72 hours. Urinalysis    Component Value Date/Time   COLORURINE YELLOW (A) 05/20/2019 1500   APPEARANCEUR TURBID (A) 05/20/2019 1500   LABSPEC 1.004 (L) 05/20/2019 1500   PHURINE 5.0 05/20/2019 1500   GLUCOSEU NEGATIVE 05/20/2019 1500   HGBUR MODERATE (A) 05/20/2019 1500   BILIRUBINUR NEGATIVE 05/20/2019 1500   KETONESUR NEGATIVE 05/20/2019 1500   PROTEINUR NEGATIVE 05/20/2019 1500   NITRITE NEGATIVE 05/20/2019 1500   LEUKOCYTESUR LARGE (A) 05/20/2019 1500   Sepsis Labs Invalid input(s): PROCALCITONIN,  WBC,  LACTICIDVEN Microbiology Recent Results (from the past 240 hour(s))  SARS Coronavirus 2 by RT PCR (hospital order, performed in [facility] hospital lab) Nasopharyngeal Nasopharyngeal Swab     Status: None   Collection Time: 05/19/19  1:47 PM   Specimen: Nasopharyngeal Swab  Result Value Ref Range Status   SARS Coronavirus 2 NEGATIVE NEGATIVE Final    Comment: (NOTE) SARS-CoV-2 target nucleic acids are NOT DETECTED. The SARS-CoV-2 RNA is generally detectable in upper and lower respiratory specimens during the acute phase of infection. The  lowest concentration of SARS-CoV-2 viral copies this assay can detect is 250 copies / mL. A negative result does not preclude SARS-CoV-2 infection and should not be used as the sole basis for treatment or other patient management decisions.  A negative result may occur with improper specimen collection / handling, submission of specimen other than nasopharyngeal swab, presence of viral mutation(s) within the areas targeted by this assay, and inadequate number of viral copies (<250 copies / mL). A negative result must be combined with clinical observations, patient history, and epidemiological information. Fact Sheet for Patients:   StrictlyIdeas.no Fact Sheet for Healthcare Providers: BankingDealers.co.za This test is not yet approved or cleared  by the [place] FDA and has been authorized for detection and/or diagnosis of SARS-CoV-2 by FDA under an Emergency Use Authorization (EUA).  This EUA will remain in effect (meaning this test can be used) for the duration of the COVID-19 declaration under Section 564(b)(1) of the Act, 21 U.S.C. section 360bbb-3(b)(1), unless the authorization is terminated or revoked sooner. Performed at [facility], [address] [place], [place] [address]   CULTURE, BLOOD (ROUTINE X 2) w Reflex to ID Panel     Status: None   Collection Time: 05/20/19  9:33 AM   Specimen: BLOOD  Result Value Ref Range Status   Specimen Description BLOOD RIGHT ANTECUBITAL  Final   Special Requests   Final    BOTTLES DRAWN AEROBIC AND ANAEROBIC Blood Culture adequate volume   Culture   Final    NO GROWTH 5 DAYS Performed at [facility], [address], [place], [place] [address]    Report Status 05/25/2019 FINAL  Final  CULTURE, BLOOD (ROUTINE X 2) w Reflex to ID Panel     Status: None   Collection Time: 05/20/19  9:43 AM  Specimen: BLOOD  Result Value Ref Range Status   Specimen Description  BLOOD RIGHT WRIST  Final   Special Requests   Final    BOTTLES DRAWN AEROBIC AND ANAEROBIC Blood Culture adequate volume   Culture   Final    NO GROWTH 5 DAYS Performed at [facility], [address] [place], [place] [address]    Report Status 05/25/2019 FINAL  Final  MRSA PCR Screening     Status: None   Collection Time: 05/20/19  8:31 PM   Specimen: Nasopharyngeal  Result Value Ref Range Status   MRSA by PCR NEGATIVE NEGATIVE Final    Comment:        The GeneXpert MRSA Assay (FDA approved for NASAL specimens only), is one component of a comprehensive MRSA colonization surveillance program. It is not intended to diagnose MRSA infection nor to guide or monitor treatment for MRSA infections. Performed at [facility], [address] [place], [place] [address]   Aerobic/Anaerobic Culture (surgical/deep wound)     Status: None   Collection Time: 05/21/19  9:58 AM   Specimen: Abscess  Result Value Ref Range Status   Specimen Description   Final    ABSCESS Performed at [facility], [address], [place], [place] [address]    Special Requests   Final    NONE Performed at [facility], [place], [place] [address]    Gram Stain NO WBC SEEN RARE GRAM POSITIVE COCCI   Final   Culture   Final    FEW ESCHERICHIA COLI NO ANAEROBES ISOLATED Performed at [facility], [place] [address] [place], [place] [address]    Report Status 05/26/2019 FINAL  Final   Organism ID, Bacteria ESCHERICHIA COLI  Final      Susceptibility   Escherichia coli - MIC*    AMPICILLIN <=2 SENSITIVE Sensitive     CEFAZOLIN <=4 SENSITIVE Sensitive     CEFEPIME <=1 SENSITIVE Sensitive     CEFTAZIDIME <=1 SENSITIVE Sensitive     CEFTRIAXONE <=1 SENSITIVE Sensitive     CIPROFLOXACIN <=0.25 SENSITIVE Sensitive     GENTAMICIN <=1 SENSITIVE Sensitive     IMIPENEM <=0.25 SENSITIVE Sensitive     TRIMETH/SULFA <=20 SENSITIVE Sensitive      AMPICILLIN/SULBACTAM <=2 SENSITIVE Sensitive     PIP/TAZO <=4 SENSITIVE Sensitive     * FEW ESCHERICHIA COLI  Aerobic/Anaerobic Culture (surgical/deep wound)     Status: None   Collection Time: 05/21/19 10:01 AM   Specimen: Abscess  Result Value Ref Range Status   Specimen Description   Final    ABSCESS Performed at [facility], [address], [place], [place] [address]    Special Requests   Final    NONE Performed at [facility], [address] [place], [place] [address]    Gram Stain   Final    FEW WBC PRESENT,BOTH PMN AND MONONUCLEAR NO ORGANISMS SEEN    Culture   Final    RARE ESCHERICHIA COLI NO ANAEROBES ISOLATED Performed at [facility], [place] [address] [place], [place] [address]    Report Status 05/26/2019 FINAL  Final   Organism ID, Bacteria ESCHERICHIA COLI  Final      Susceptibility   Escherichia coli - MIC*    AMPICILLIN <=2 SENSITIVE Sensitive     CEFAZOLIN <=4 SENSITIVE Sensitive     CEFEPIME <=1 SENSITIVE Sensitive     CEFTAZIDIME <=1 SENSITIVE Sensitive  CEFTRIAXONE <=1 SENSITIVE Sensitive     CIPROFLOXACIN <=0.25 SENSITIVE Sensitive     GENTAMICIN <=1 SENSITIVE Sensitive     IMIPENEM <=0.25 SENSITIVE Sensitive     TRIMETH/SULFA <=20 SENSITIVE Sensitive     AMPICILLIN/SULBACTAM <=2 SENSITIVE Sensitive     PIP/TAZO <=4 SENSITIVE Sensitive     * RARE ESCHERICHIA COLI  SARS CORONAVIRUS 2 (TAT 6-24 HRS) Nasopharyngeal Nasopharyngeal Swab     Status: None   Collection Time: 05/26/19  2:58 PM   Specimen: Nasopharyngeal Swab  Result Value Ref Range Status   SARS Coronavirus 2 NEGATIVE NEGATIVE Final    Comment: (NOTE) SARS-CoV-2 target nucleic acids are NOT DETECTED. The SARS-CoV-2 RNA is generally detectable in upper and lower respiratory specimens during the acute phase of infection. Negative results do not preclude SARS-CoV-2 infection, do not rule out co-infections with other pathogens, and should not be used as  the sole basis for treatment or other patient management decisions. Negative results must be combined with clinical observations, patient history, and epidemiological information. The expected result is Negative. Fact Sheet for Patients: SugarRoll.be Fact Sheet for Healthcare Providers: https://www.woods-mathews.com/ This test is not yet approved or cleared by the [place] FDA and  has been authorized for detection and/or diagnosis of SARS-CoV-2 by FDA under an Emergency Use Authorization (EUA). This EUA will remain  in effect (meaning this test can be used) for the duration of the COVID-19 declaration under Section 56 4(b)(1) of the Act, 21 U.S.C. section 360bbb-3(b)(1), unless the authorization is terminated or revoked sooner. Performed at [facility], [place] [address] [place], [place] [address]     Time coordinating discharge: Over 30 minutes  SIGNED:  Lorella Nimrod, MD  [facility] 05/28/2019, 9:17 AM  If 7PM-7AM, please contact night-coverage [email or web address]  This record has been created using Systems analyst. Errors have been sought and corrected,but may not always be located. Such creation errors do not reflect on the standard of care.

## 2019-05-28 NOTE — Plan of Care (Signed)
No BM had been charted on pt since admission.  Pt is supposed to d/c to SNF at 9 am today.  Out of concern, d/c would be held up for lack of BM, dulcolax suppository was administered.  Dressing on non-stageable sacral wound was changed at same time.  Covered in exudate and there had been some drainage.  Wound has become odoriferous.  Applied santyl, moist 2x2 and clean foam dressing.

## 2019-05-28 NOTE — Progress Notes (Signed)
Called report to [facility], LPN at [facility]. Patient is going with RUE PICC line, and Foley. Patients belongings packed to go with her. Patient is being transported via ambulance by Financial planner.

## 2019-05-28 NOTE — TOC Transition Note (Signed)
Transition of Care [facility]) - CM/SW Discharge Note   Patient Details  Name: Rachal Dvorsky MRN: [id number] Date of Birth: 07/23/1960  [facility] CM/SW Contact:  Lucy Chris, LCSW Phone Number: 05/28/2019, 8:48 AM   Clinical Narrative:   Pt medically stable for transfer to [facility] set up for 10:00 am to transport. Will take 3 hrs to get there. Pt has had a BM and is ready. Bedside RN to call report [phone]. Insurance Berkley Harvey [phone]. Jessica-Adm at facility aware and ready. Awaiting MD paperwork completion. Completed and pt ready to go, feels ready and glad to be going closer to her family.    Final next level of care: Skilled Nursing Facility Barriers to Discharge: Barriers Resolved   Patient Goals and CMS Choice Patient states their goals for this hospitalization and ongoing recovery are:: Sister wants closer to her in [place], can manage and make sure cared for      Discharge Placement   Existing PASRR number confirmed : 05/20/19          Patient chooses bed at: Other - please specify in the comment section below:([facility]) Patient to be transferred to facility by: [facility] Name of family member notified: Elizabeth-sister Patient and family notified of of transfer: 05/28/19  Discharge Plan and Services In-house Referral: Clinical Social Work                                   Social Determinants of Health (SDOH) Interventions     Readmission Risk Interventions No flowsheet data found.

## 2019-06-26 ENCOUNTER — Ambulatory Visit: Payer: Commercial Managed Care - PPO | Attending: Infectious Diseases | Admitting: Infectious Diseases

## 2019-06-26 ENCOUNTER — Other Ambulatory Visit: Payer: Self-pay

## 2019-06-26 ENCOUNTER — Ambulatory Visit: Payer: Self-pay | Admitting: Physician Assistant

## 2019-06-26 DIAGNOSIS — N319 Neuromuscular dysfunction of bladder, unspecified: Secondary | ICD-10-CM

## 2019-06-26 DIAGNOSIS — I10 Essential (primary) hypertension: Secondary | ICD-10-CM

## 2019-06-26 DIAGNOSIS — E119 Type 2 diabetes mellitus without complications: Secondary | ICD-10-CM

## 2019-06-26 DIAGNOSIS — K6812 Psoas muscle abscess: Secondary | ICD-10-CM

## 2019-06-26 DIAGNOSIS — B9620 Unspecified Escherichia coli [E. coli] as the cause of diseases classified elsewhere: Secondary | ICD-10-CM

## 2019-06-26 DIAGNOSIS — M4646 Discitis, unspecified, lumbar region: Secondary | ICD-10-CM

## 2019-06-26 NOTE — Progress Notes (Signed)
Virtual visit as patient is in [place] in [place]   Consent was obtained for phone visit:  Yes.   Answered questions that patient had about telehealth interaction:  Yes.   I discussed the limitations, risks, security and privacy concerns of performing an evaluation and management service by telephone. I also discussed with the patient that there may be a patient responsible charge related to this service. The patient expressed understanding and agreed to proceed.   Patient Location: [facility] [place] Spoke to NP Younger  Provider Location:office   BP (!) 106/59   Pulse 90   Temp (!) 97.3 F (36.3 C) Comment (Src): Vitals done at SNF  Resp 17   Wt 122 lb 9.6 oz (55.6 kg)   SpO2 90%   BMI 22.42 kg/m    Was in [facility] between 05/19/19-05/28/19  May 27th- June 7th - admitted to [facility] after a fall and diagnosed with encephalopathy, due to psychiatric issues and metabolic issues BIPOLAR disorder/major depressive disorder -  Seen by psychiatrist  E.coli vertebral osteomyelitis B/l psoas abscess Neurogenic bladder   MEDS Senna glargine insulin simvastatin Losartan/HCTZ levothryoxine MVT Tylenol vitamin d3 40k-units Gabapentin Vit c Melatonin risperdone-1/1/2 mg  lamictal fenlofexine  Pt still has periods of confusion/hallucination/poor mobility/ Spoke to her and she says she is doing much better Was fully oriented Says she started to get PT Unable to walk as weakness below knees Still has foley for neurogenic bladder Psychiatrist seen her and adjusted medications  Impression/Recommendation  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 NH says they are checking a level soon  CVA- frontal infarct  DM- on insulin  HTN- on ACEI+HCTZ  Discussed the management in detail with Joye, NP Younger and nurse Jennifer Spent 25 minutes on the telephone
